# Patient Record
Sex: Male | Born: 1940 | Hispanic: Yes | Marital: Married | Smoking: Never smoker
Health system: Southern US, Community
[De-identification: ages and names within clinical notes are randomized; demographics above are authoritative.]

## PROBLEM LIST (undated history)

## (undated) DIAGNOSIS — M1990 Unspecified osteoarthritis, unspecified site: Secondary | ICD-10-CM

## (undated) DIAGNOSIS — B001 Herpesviral vesicular dermatitis: Secondary | ICD-10-CM

## (undated) DIAGNOSIS — R7303 Prediabetes: Secondary | ICD-10-CM

## (undated) DIAGNOSIS — Z85828 Personal history of other malignant neoplasm of skin: Secondary | ICD-10-CM

## (undated) DIAGNOSIS — I10 Essential (primary) hypertension: Secondary | ICD-10-CM

## (undated) DIAGNOSIS — R519 Headache, unspecified: Secondary | ICD-10-CM

## (undated) DIAGNOSIS — R51 Headache: Secondary | ICD-10-CM

## (undated) DIAGNOSIS — E785 Hyperlipidemia, unspecified: Secondary | ICD-10-CM

## (undated) HISTORY — DX: Hyperlipidemia, unspecified: E78.5

## (undated) HISTORY — DX: Herpesviral vesicular dermatitis: B00.1

## (undated) HISTORY — DX: Personal history of other malignant neoplasm of skin: Z85.828

## (undated) HISTORY — DX: Prediabetes: R73.03

## (undated) HISTORY — DX: Essential (primary) hypertension: I10

## (undated) HISTORY — DX: Headache: R51

## (undated) HISTORY — PX: MENISCUS REPAIR: SHX5179

## (undated) HISTORY — PX: TRANSURETHRAL RESECTION OF PROSTATE: SHX73

## (undated) HISTORY — DX: Headache, unspecified: R51.9

---

## 2011-04-26 LAB — HM COLONOSCOPY

## 2014-10-06 HISTORY — PX: INGUINAL HERNIA REPAIR: SUR1180

## 2016-03-19 DIAGNOSIS — M545 Low back pain: Secondary | ICD-10-CM | POA: Diagnosis not present

## 2016-03-21 DIAGNOSIS — D1801 Hemangioma of skin and subcutaneous tissue: Secondary | ICD-10-CM | POA: Diagnosis not present

## 2016-03-21 DIAGNOSIS — Z85820 Personal history of malignant melanoma of skin: Secondary | ICD-10-CM | POA: Diagnosis not present

## 2016-03-21 DIAGNOSIS — D225 Melanocytic nevi of trunk: Secondary | ICD-10-CM | POA: Diagnosis not present

## 2016-03-21 DIAGNOSIS — L821 Other seborrheic keratosis: Secondary | ICD-10-CM | POA: Diagnosis not present

## 2016-03-21 DIAGNOSIS — L910 Hypertrophic scar: Secondary | ICD-10-CM | POA: Diagnosis not present

## 2016-03-27 DIAGNOSIS — M545 Low back pain: Secondary | ICD-10-CM | POA: Diagnosis not present

## 2016-04-02 DIAGNOSIS — M545 Low back pain: Secondary | ICD-10-CM | POA: Diagnosis not present

## 2016-04-04 DIAGNOSIS — M545 Low back pain: Secondary | ICD-10-CM | POA: Diagnosis not present

## 2016-04-09 DIAGNOSIS — M545 Low back pain: Secondary | ICD-10-CM | POA: Diagnosis not present

## 2016-04-17 DIAGNOSIS — M545 Low back pain: Secondary | ICD-10-CM | POA: Diagnosis not present

## 2016-04-20 DIAGNOSIS — H00012 Hordeolum externum right lower eyelid: Secondary | ICD-10-CM | POA: Diagnosis not present

## 2016-04-20 DIAGNOSIS — M4696 Unspecified inflammatory spondylopathy, lumbar region: Secondary | ICD-10-CM | POA: Diagnosis not present

## 2016-04-23 DIAGNOSIS — M47816 Spondylosis without myelopathy or radiculopathy, lumbar region: Secondary | ICD-10-CM | POA: Diagnosis not present

## 2016-04-23 DIAGNOSIS — H10411 Chronic giant papillary conjunctivitis, right eye: Secondary | ICD-10-CM | POA: Diagnosis not present

## 2016-04-24 ENCOUNTER — Telehealth: Payer: Self-pay

## 2016-04-24 NOTE — Telephone Encounter (Signed)
Pre VIsit call completed.  

## 2016-04-25 ENCOUNTER — Ambulatory Visit (INDEPENDENT_AMBULATORY_CARE_PROVIDER_SITE_OTHER): Payer: Medicare Other | Admitting: Family Medicine

## 2016-04-25 ENCOUNTER — Ambulatory Visit: Payer: Self-pay | Admitting: Family Medicine

## 2016-04-25 ENCOUNTER — Telehealth: Payer: Self-pay | Admitting: Family Medicine

## 2016-04-25 ENCOUNTER — Encounter: Payer: Self-pay | Admitting: Family Medicine

## 2016-04-25 VITALS — BP 132/68 | HR 72 | Temp 97.6°F | Ht 63.0 in | Wt 147.4 lb

## 2016-04-25 DIAGNOSIS — R109 Unspecified abdominal pain: Secondary | ICD-10-CM

## 2016-04-25 DIAGNOSIS — G4700 Insomnia, unspecified: Secondary | ICD-10-CM

## 2016-04-25 LAB — CBC WITH DIFFERENTIAL/PLATELET
Basophils Absolute: 0 10*3/uL (ref 0.0–0.1)
Basophils Relative: 0.5 % (ref 0.0–3.0)
EOS PCT: 2.1 % (ref 0.0–5.0)
Eosinophils Absolute: 0.1 10*3/uL (ref 0.0–0.7)
HCT: 45.7 % (ref 39.0–52.0)
Hemoglobin: 15.9 g/dL (ref 13.0–17.0)
LYMPHS ABS: 1.4 10*3/uL (ref 0.7–4.0)
Lymphocytes Relative: 26 % (ref 12.0–46.0)
MCHC: 34.8 g/dL (ref 30.0–36.0)
MCV: 91.2 fl (ref 78.0–100.0)
MONO ABS: 0.6 10*3/uL (ref 0.1–1.0)
Monocytes Relative: 11 % (ref 3.0–12.0)
NEUTROS ABS: 3.2 10*3/uL (ref 1.4–7.7)
NEUTROS PCT: 60.4 % (ref 43.0–77.0)
PLATELETS: 185 10*3/uL (ref 150.0–400.0)
RBC: 5.02 Mil/uL (ref 4.22–5.81)
RDW: 13.1 % (ref 11.5–15.5)
WBC: 5.2 10*3/uL (ref 4.0–10.5)

## 2016-04-25 LAB — COMPREHENSIVE METABOLIC PANEL
ALK PHOS: 77 U/L (ref 39–117)
ALT: 45 U/L (ref 0–53)
AST: 31 U/L (ref 0–37)
Albumin: 4.5 g/dL (ref 3.5–5.2)
BUN: 20 mg/dL (ref 6–23)
CO2: 30 mEq/L (ref 19–32)
Calcium: 9.6 mg/dL (ref 8.4–10.5)
Chloride: 103 mEq/L (ref 96–112)
Creatinine, Ser: 1.02 mg/dL (ref 0.40–1.50)
GFR: 75.53 mL/min (ref >60.00)
GLUCOSE: 119 mg/dL — AB (ref 70–99)
POTASSIUM: 4.1 meq/L (ref 3.5–5.1)
SODIUM: 140 meq/L (ref 135–145)
TOTAL PROTEIN: 7.1 g/dL (ref 6.0–8.3)
Total Bilirubin: 0.8 mg/dL (ref 0.2–1.2)

## 2016-04-25 MED ORDER — PANTOPRAZOLE SODIUM 40 MG PO TBEC: 40.0000 mg | DELAYED_RELEASE_TABLET | Freq: Every day | ORAL | 0 refills | Status: DC

## 2016-04-25 MED ORDER — TRAZODONE HCL 50 MG PO TABS: 25.0000 mg | ORAL_TABLET | Freq: Every evening | ORAL | 1 refills | Status: DC | PRN

## 2016-04-25 NOTE — Telephone Encounter (Signed)
I have resent Rx to the correct pharmacy. Pt is aware. TL/CMA

## 2016-04-25 NOTE — Telephone Encounter (Signed)
Caller name: Chueyee  Relation to pt: self  Call back number: [phone] [facility]acy: [facility] [place],  [place]- Tel [phone]  Reason for call: Pt was seen today as a new pt (04-25-16) and saw on his documents that his meds were sent to [facility], pt states needs his meds sent to [facility] in [place]. Please advise ASAP.

## 2016-04-25 NOTE — Patient Instructions (Addendum)
Keep a symptom diary and see what makes your discomfort better and worse. Pay particular attention to foods, bowel movements and liquids.   Contact [phone] to schedule an appointment or inquire about cost/insurance coverage to help with sleep. Ask about cognitive behavioral therapy (CBT).

## 2016-04-25 NOTE — Progress Notes (Signed)
Chief Complaint  Patient presents with  . Establish Care    Pt reports severe headaches and sensitvity with noise and dizziness       New Patient Visit SUBJECTIVE: HPI: Ronnie Payne is an 75 y.o.male who is being seen for establishing care.  The patient was previously seen at an office in [place].   Abd discomfort Over the past mo, the pt has been experiencing nausea, abdominal pain, poor appetite. He has lost 3-4 lbs over the past mo.  The pain is centrally located. No fevers, diarrhea, constipation, nighttime awakenings, or dark tarry stools/bleeding. He has tried Omeprazole and ranitidine that was not helpful-it had been in the past with similar discomfort. He does not notice anything that makes it better or worse.  He also has a hx of insomnia for which he had been taking Restoril and Xanax.   No Known Allergies  Past Medical History:  Diagnosis Date  . Frequent headaches   . History of skin cancer   . Hyperlipidemia   . Hypertension    Past Surgical History:  Procedure Laterality Date  . INGUINAL HERNIA REPAIR  10/06/2014  . TRANSURETHRAL RESECTION OF PROSTATE  2000-2012   Social History   Social History  . Marital status: Married   Social History Main Topics  . Smoking status: Never Smoker  . Smokeless tobacco: Never Used  . Alcohol use No  . Drug use: No   History reviewed. No pertinent family history.   Current Outpatient Prescriptions:  .  atenolol (TENORMIN) 50 MG tablet, Take 1 tablet by mouth daily., Disp: , Rfl:  .  atorvastatin (LIPITOR) 40 MG tablet, Take 1 tablet by mouth daily., Disp: , Rfl:  .  Cholecalciferol (VITAMIN D) 2000 units tablet, Take 2,000 Units by mouth daily., Disp: , Rfl:  .  doxycycline (VIBRAMYCIN) 100 MG capsule, Take 1 capsule by mouth 2 (two) times daily., Disp: , Rfl:  .  multivitamin-iron-minerals-folic acid (CENTRUM) chewable tablet, Chew 1 tablet by mouth daily., Disp: , Rfl:  .  omeprazole (PRILOSEC) 40 MG capsule, Take 1  capsule by mouth as needed., Disp: , Rfl:  .  pantoprazole (PROTONIX) 40 MG tablet, Take 1 tablet (40 mg total) by mouth daily., Disp: 30 tablet, Rfl: 0 .  traZODone (DESYREL) 50 MG tablet, Take 0.5-1 tablets (25-50 mg total) by mouth at bedtime as needed for sleep., Disp: 30 tablet, Rfl: 1  ROS GU: Denies urinary complaints  GI: As noted in HPI   OBJECTIVE: BP 132/68 (BP Location: Left Arm, Patient Position: Sitting, Cuff Size: Small)   Pulse 72   Temp 97.6 F (36.4 C) (Oral)   Ht 5\' 3"  (1.6 m)   Wt 147 lb 6.4 oz (66.9 kg)   SpO2 98%   BMI 26.11 kg/m   Constitutional: -  VS reviewed -  Well developed, well nourished, appears stated age -  No apparent distress  Psychiatric: -  Oriented to person, place, and time -  Memory intact -  Affect and mood normal -  Fluent conversation, good eye contact -  Judgment and insight age appropriate  ENMT: -  Oral mucosa without lesions, tongue and uvula midline    Tonsils not enlarged, no erythema, no exudate, trachea midline    Pharynx moist, no lesions, no erythema  Neck: -  No gross swelling, no palpable masses -  Thyroid midline, not enlarged, mobile, no palpable masses  Cardiovascular: -  RRR, no murmurs -  No LE edema  Respiratory: -  Normal respiratory effort, no accessory muscle use, no retraction -  Breath sounds equal, no wheezes, no ronchi, no crackles  Gastrointestinal: -  Bowel sounds normal -  Mild TTP in umbilical region, no distention, no guarding, no masses -  Neg Murphy's, McBurney's, Rovsing's, Carnetts  Neurological:  -  CN II - XII grossly intact -  Sensation grossly intact to light touch, equal bilaterally  Musculoskeletal: -  No clubbing, no cyanosis -  Gait normal  Skin: -  No significant lesion on inspection -  Warm and dry to palpation   ASSESSMENT/PLAN: Abdominal discomfort - Plan: Comprehensive metabolic panel, CBC w/Diff, pantoprazole (PROTONIX) 40 MG tablet, DISCONTINUED: pantoprazole (PROTONIX) 40 MG  tablet  Insomnia, unspecified type - Plan: traZODone (DESYREL) 50 MG tablet, DISCONTINUED: traZODone (DESYREL) 50 MG tablet  Patient instructed to sign release of records form from his previous PCP. Change omeprazole to pantoprazole. Keep symptom journal. Basic labs. I instructed him that I do not rx hypnotics or benzo's for sleep. Number given for CBT. Changed to Trazodone.  Patient should return in 2 weeks. If he is still having issues, will refer to GI for hopeful EGD. The patient voiced understanding and agreement to the plan.   [place], DO 04/25/16  2:28 PM

## 2016-04-25 NOTE — Progress Notes (Signed)
Pre visit review using our clinic review tool, if applicable. No additional management support is needed unless otherwise documented below in the visit note. 

## 2016-04-27 DIAGNOSIS — H10411 Chronic giant papillary conjunctivitis, right eye: Secondary | ICD-10-CM | POA: Diagnosis not present

## 2016-04-27 DIAGNOSIS — H2513 Age-related nuclear cataract, bilateral: Secondary | ICD-10-CM | POA: Diagnosis not present

## 2016-04-27 DIAGNOSIS — H00012 Hordeolum externum right lower eyelid: Secondary | ICD-10-CM | POA: Diagnosis not present

## 2016-05-08 DIAGNOSIS — M47816 Spondylosis without myelopathy or radiculopathy, lumbar region: Secondary | ICD-10-CM | POA: Diagnosis not present

## 2016-05-09 ENCOUNTER — Ambulatory Visit (INDEPENDENT_AMBULATORY_CARE_PROVIDER_SITE_OTHER): Payer: Medicare Other | Admitting: Family Medicine

## 2016-05-09 ENCOUNTER — Encounter: Payer: Self-pay | Admitting: Gastroenterology

## 2016-05-09 ENCOUNTER — Encounter: Payer: Self-pay | Admitting: Family Medicine

## 2016-05-09 VITALS — BP 123/59 | HR 74 | Temp 97.5°F | Ht 63.0 in | Wt 148.0 lb

## 2016-05-09 DIAGNOSIS — R1013 Epigastric pain: Secondary | ICD-10-CM | POA: Diagnosis not present

## 2016-05-09 NOTE — Progress Notes (Signed)
Chief Complaint  Patient presents with  . Follow-up    abdominal pain /Pt reports improving very little but not much     Ronnie Payne is here for burning abdominal pain. It is constant for him.  Duration: 7 weeks Nighttime awakenings? No Bleeding? No Weight loss? Yes Palliation: Ranitidine Provocation: eating, position does not affect it Associated symptoms: Nausea Denies: fevers, diarrhea, blood in stool, dark tarry stools Treatment to date: omeprazole, pantoprazole Is maintained and his bowel movements are normal for him and his pain is not affected by defecation.  ROS: Constitutional: No fevers GI: No V/D/C, no bleeding + pain  Past Medical History:  Diagnosis Date  . Frequent headaches   . History of skin cancer   . Hyperlipidemia   . Hypertension    No family history on file. Past Surgical History:  Procedure Laterality Date  . INGUINAL HERNIA REPAIR  10/06/2014  . TRANSURETHRAL RESECTION OF PROSTATE  2000-2012    BP (!) 123/59 (BP Location: Left Arm, Patient Position: Sitting, Cuff Size: Small)   Pulse 74   Temp 97.5 F (36.4 C) (Oral)   Ht 5\' 3"  (1.6 m)   Wt 148 lb (67.1 kg)   SpO2 99%   BMI 26.22 kg/m  Gen.: Awake, alert, appears stated age 76: Mucous membranes moist without mucosal lesions Heart: Regular rate and rhythm without murmurs Lungs: Clear auscultation bilaterally, no rales or wheezing, normal effort without accessory muscle use. Abdomen: Bowel sounds are present. Abdomen is soft, TTP in epigastric region, nondistended, no masses or organomegaly. Negative Murphy's, Rovsing's, McBurney's, and Carnett's sign. Psych: Age appropriate judgment and insight. Normal mood and affect.  Abdominal pain, epigastric - Plan: Ambulatory referral to Gastroenterology  His labs were unremarkable. Given failure of 2 PPI's, will refer to GI for second opinion and hopeful EGD to also r/o ulcers given his slight weight loss and loss of appetite. F/u prn. Pt  voiced understanding and agreement to the plan.  [place], DO 05/09/16 4:09 PM

## 2016-05-09 NOTE — Patient Instructions (Signed)
Claritin (loratadine), Allegra (fexofenadine), Zyrtec (cetirizine); these are listed in order from weakest to strongest. Generic, and therefore cheaper, options are in the parentheses.   There are available OTC, and the generic versions, which may be cheaper, are in parentheses. Show this to a pharmacist if you have trouble finding any of these items.  Someone will contact you regarding your GI appointment. If you do not hear anything in 1-2 weeks, feel free to contact our office for an update.

## 2016-05-09 NOTE — Progress Notes (Signed)
Pre visit review using our clinic review tool, if applicable. No additional management support is needed unless otherwise documented below in the visit note. 

## 2016-05-11 ENCOUNTER — Encounter: Payer: Self-pay | Admitting: Physician Assistant

## 2016-05-11 ENCOUNTER — Ambulatory Visit (INDEPENDENT_AMBULATORY_CARE_PROVIDER_SITE_OTHER): Payer: Medicare Other | Admitting: Physician Assistant

## 2016-05-11 VITALS — BP 122/70 | HR 72 | Ht 63.0 in | Wt 148.2 lb

## 2016-05-11 DIAGNOSIS — R1033 Periumbilical pain: Secondary | ICD-10-CM | POA: Diagnosis not present

## 2016-05-11 DIAGNOSIS — R6881 Early satiety: Secondary | ICD-10-CM | POA: Diagnosis not present

## 2016-05-11 DIAGNOSIS — R1084 Generalized abdominal pain: Secondary | ICD-10-CM

## 2016-05-11 DIAGNOSIS — R630 Anorexia: Secondary | ICD-10-CM | POA: Diagnosis not present

## 2016-05-11 DIAGNOSIS — R109 Unspecified abdominal pain: Secondary | ICD-10-CM

## 2016-05-11 DIAGNOSIS — R110 Nausea: Secondary | ICD-10-CM

## 2016-05-11 MED ORDER — PANTOPRAZOLE SODIUM 40 MG PO TBEC: 40.0000 mg | DELAYED_RELEASE_TABLET | Freq: Every day | ORAL | 3 refills | Status: DC

## 2016-05-11 NOTE — Progress Notes (Signed)
Subjective:    Patient ID: Ronnie Payne, male    DOB: 1940-11-07, 76 y.o.   MRN: [id number]  HPI Ronnie Payne is a pleasant 76 year old Hispanic male male, new to GI today referred by Riki Sheer D.O. for evaluation of abdominal pain. Patient states that he and his wife relocated to [place] about 3 months ago and had been in [place] prior to that. He states that he did have one prior colonoscopy done while living in [place] about 7 years ago and that was a normal exam. As no other previous GI history. He has been relatively healthy with history of BPH status post TURP and had a prior hernia repair. Patient states she's had his current abdominal pain for about 6 weeks. Asked whenever he is had any abdominal discomfort his wife would start him on Zantac which would usually help. He started using Zantac fairly regularly without any benefit, she been purchased omeprazole 20 mg by mouth daily and then increase this to 40 mg by mouth daily but still had no relief in his discomfort. He eventually went to his PCP Dr. Nani Ravens and was started on Protonix 40 mg by mouth daily. He has been on that over the past couple of weeks and has noted no change in his symptoms. He says that his discomfort is in his mid abdomen he describes it as a burning type discomfort. He has had some nighttime awakening with pain. His appetite has been decreased and his weight is down 2 or 3 pounds. He relates that he has been filling up very quickly and feels very full with less food. He has had nausea without vomiting. No heartburn or indigestion no dysphagia no melena or hematochezia. He is not on any regular aspirin or NSAIDs, no regular EtOH. Family history negative for GI disease as far as he is aware. CBC and CMET done 04/25/2016 both unremarkable.   Review of Systems Pertinent positive and negative review of systems were noted in the above HPI section.  All other review of systems was otherwise negative.  Outpatient  Encounter Prescriptions as of 05/11/2016  Medication Sig  . atenolol (TENORMIN) 50 MG tablet Take 1 tablet by mouth daily.  Marland Kitchen atorvastatin (LIPITOR) 40 MG tablet Take 1 tablet by mouth daily.  . Cholecalciferol (VITAMIN D) 2000 units tablet Take 2,000 Units by mouth daily.  . multivitamin-iron-minerals-folic acid (CENTRUM) chewable tablet Chew 1 tablet by mouth daily.  Marland Kitchen neomycin-polymyxin b-dexamethasone (MAXITROL) 3.5-10000-0.1 OINT Place 1 application into both eyes 2 (two) times daily.  . pantoprazole (PROTONIX) 40 MG tablet Take 1 tablet (40 mg total) by mouth daily.  . [DISCONTINUED] pantoprazole (PROTONIX) 40 MG tablet Take 1 tablet (40 mg total) by mouth daily.  . [DISCONTINUED] doxycycline (VIBRAMYCIN) 100 MG capsule Take 1 capsule by mouth 2 (two) times daily.  . [DISCONTINUED] omeprazole (PRILOSEC) 40 MG capsule Take 1 capsule by mouth as needed.  . [DISCONTINUED] traZODone (DESYREL) 50 MG tablet Take 0.5-1 tablets (25-50 mg total) by mouth at bedtime as needed for sleep.   No facility-administered encounter medications on file as of 05/11/2016.    No Known Allergies There are no active problems to display for this patient.  Social History   Social History  . Marital status: Married    Spouse name: N/A  . Number of children: 1  . Years of education: N/A   Occupational History  . retired     Optometrist   Social History Main Topics  . Smoking status: Never  Smoker  . Smokeless tobacco: Never Used  . Alcohol use No  . Drug use: No  . Sexual activity: Not on file   Other Topics Concern  . Not on file   Social History Narrative  . No narrative on file    [name]'s family history includes Emphysema in his father; Lung cancer in his sister; Thyroid cancer in his mother.      Objective:    Vitals:   05/11/16 1319  BP: 122/70  Pulse: 72    Physical Exam  well-developed older Hispanic male in no acute distress, pleasant blood pressure 122/70 pulse 72 height 5  foot 3 weight 148 BMI of 26.2. HEENT; nontraumatic normocephalic EOMI PERRLA sclera anicteric, Cardiovascular; regular rate and rhythm with S1-S2 no murmur or gallop, Pulmonary; clear bilaterally, Abdomen ;soft, bowel sounds are present there is no palpable mass or hepatosplenomegaly he does have some mild tenderness across the upper abdomen nonfocal, Rectal; exam not done, Extremities ;no clubbing cyanosis or edema skin warm and dry, Neuropsych ;mood and affect appropriate       Assessment & Plan:   #38 76 year old Hispanic male with 6 week history of mid and upper abdominal discomfort associated with nausea, early satiety decrease in appetite and mild weight loss. Patient has had no improvement with PPI therapy. Etiology of pain is not clear, rule out intra-abdominal malignancy, rule out possible peptic ulcer disease.  #2 BPH  Plan; Patient will continue Protonix 40 mg by mouth every morning for now Schedule  for CT of the abdomen and pelvis with contrast next week. If CT is unremarkable he will need EGD with Dr. Henrene Pastor. I discussed potential EGD with patient and his wife today and they are agreeable.    Amy S Esterwood PA-C 05/11/2016   Cc: Shelda Pal*

## 2016-05-11 NOTE — Patient Instructions (Addendum)
We sent refills for the Pantoprazole Sodium 40 mg to [facility].    You have been scheduled for a CT scan of the abdomen and pelvis at [facility] ([address]---this is in the same building as [facility]).   You are scheduled on Tuesday 05-15-2016 at 3:30 PM. You should arrive at 3:15 pm to your appointment time for registration. Please follow the written instructions below on the day of your exam:  WARNING: IF YOU ARE ALLERGIC TO IODINE/X-RAY DYE, PLEASE NOTIFY RADIOLOGY IMMEDIATELY AT [phone]! YOU WILL BE GIVEN A 13 HOUR PREMEDICATION PREP.  1) Do not eat or drink anything after 11:30 am (4 hours prior to your test) 2) You have been given 2 bottles of oral contrast to drink. The solution may taste               better if refrigerated, but do NOT add ice or any other liquid to this solution. Shake             well before drinking.    Drink 1 bottle of contrast @ 1:30 PM (2 hours prior to your exam)  Drink 1 bottle of contrast @ 2:30 PM (1 hour prior to your exam)  You may take any medications as prescribed with a small amount of water except for the following: Metformin, Glucophage, Glucovance, Avandamet, Riomet, Fortamet, Actoplus Met, Janumet, Glumetza or Metaglip. The above medications must be held the day of the exam AND 48 hours after the exam.  The purpose of you drinking the oral contrast is to aid in the visualization of your intestinal tract. The contrast solution may cause some diarrhea. Before your exam is started, you will be given a small amount of fluid to drink. Depending on your individual set of symptoms, you may also receive an intravenous injection of x-ray contrast/dye. Plan on being at [facility] for 30 minutes or long, depending on the type of exam you are having performed.  If you have any questions regarding your exam or if you need to reschedule, you may call the CT department at [phone] between the  hours of 8:00 am and 5:00 pm, Monday-Friday.  ________________________________________________________________________

## 2016-05-12 ENCOUNTER — Encounter: Payer: Self-pay | Admitting: Physician Assistant

## 2016-05-13 NOTE — Progress Notes (Signed)
Agree with initial assessment and plans 

## 2016-05-15 ENCOUNTER — Ambulatory Visit (INDEPENDENT_AMBULATORY_CARE_PROVIDER_SITE_OTHER)
Admission: RE | Admit: 2016-05-15 | Discharge: 2016-05-15 | Disposition: A | Discharge status: Home / Self Care | Payer: Medicare Other | Source: Ambulatory Visit | Attending: Physician Assistant | Admitting: Physician Assistant

## 2016-05-15 DIAGNOSIS — R1084 Generalized abdominal pain: Secondary | ICD-10-CM

## 2016-05-15 DIAGNOSIS — R1033 Periumbilical pain: Secondary | ICD-10-CM

## 2016-05-15 DIAGNOSIS — R6881 Early satiety: Secondary | ICD-10-CM

## 2016-05-15 DIAGNOSIS — K7689 Other specified diseases of liver: Secondary | ICD-10-CM | POA: Diagnosis not present

## 2016-05-15 DIAGNOSIS — R110 Nausea: Secondary | ICD-10-CM

## 2016-05-15 DIAGNOSIS — R630 Anorexia: Secondary | ICD-10-CM

## 2016-05-15 IMAGING — CT CT ABD-PELV W/ CM
2 of 5 series · 15 of 46 positions shown, 17 images · IV contrast (ISOVUE 300)
Comparison: None.

CLINICAL DATA: Upper mid abdominal pain, early satiety for about 6
weeks

EXAM:
CT ABDOMEN AND PELVIS WITH CONTRAST
TECHNIQUE: Multidetector CT imaging of the abdomen and pelvis was performed
using the standard protocol following bolus administration of
intravenous contrast.
CONTRAST:  100mL [0T] IOPAMIDOL ([0T]) INJECTION 61%

[Series 2: abd/pel w · axial · 0.69mm/px · z∈[-465,-125]mm · 12 of 77 slices shown, 14 images]
[im 5/77  soft-tissue]
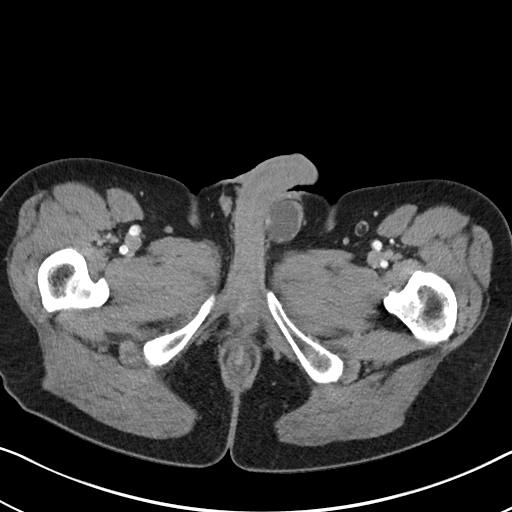
[im 5/77  bone]
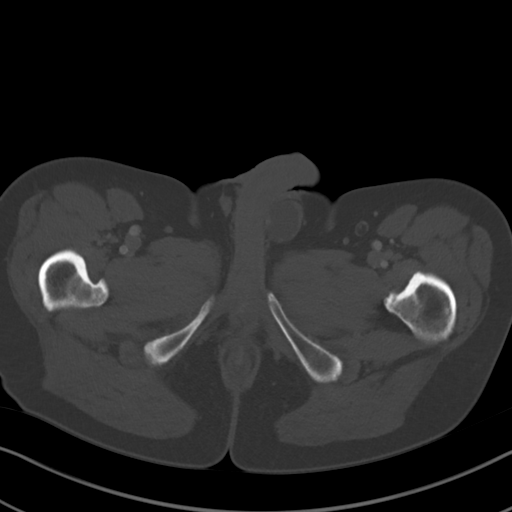
[im 13/77  soft-tissue]
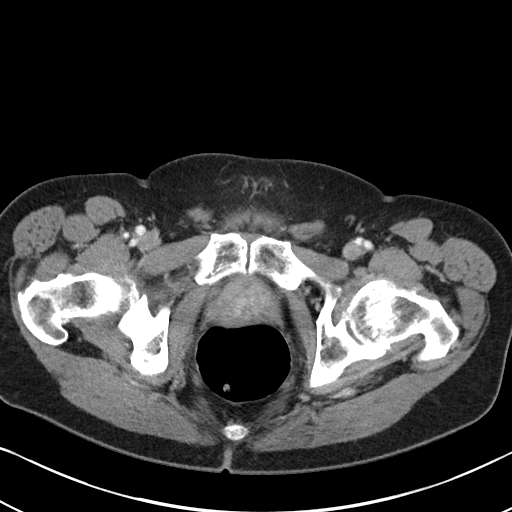
[im 17/77  soft-tissue]
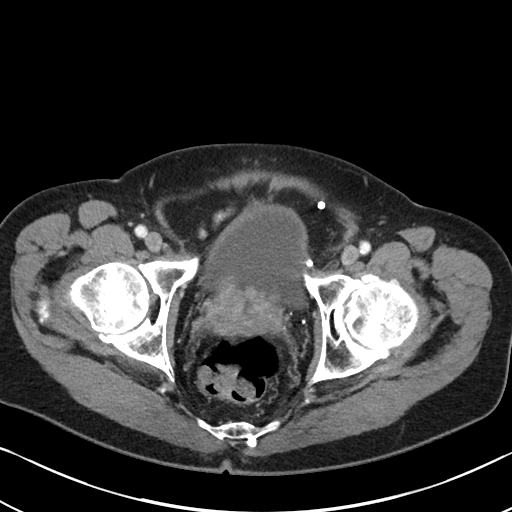
[im 25/77  soft-tissue]
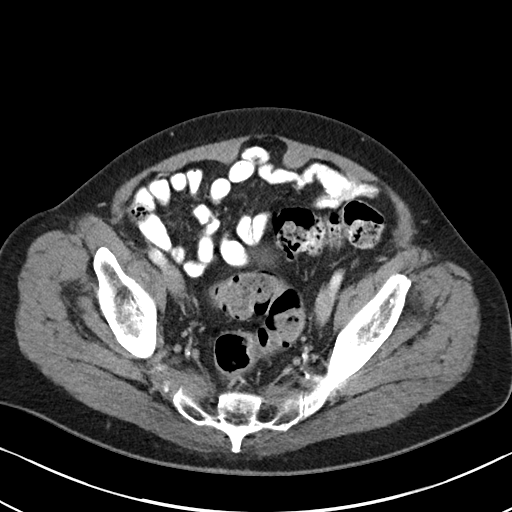
[im 29/77  soft-tissue]
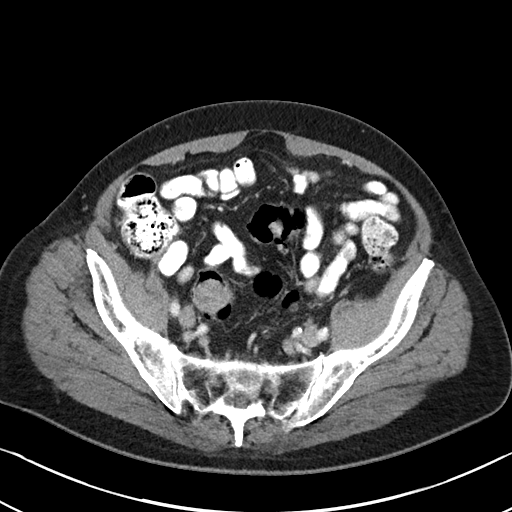
[im 37/77  soft-tissue]
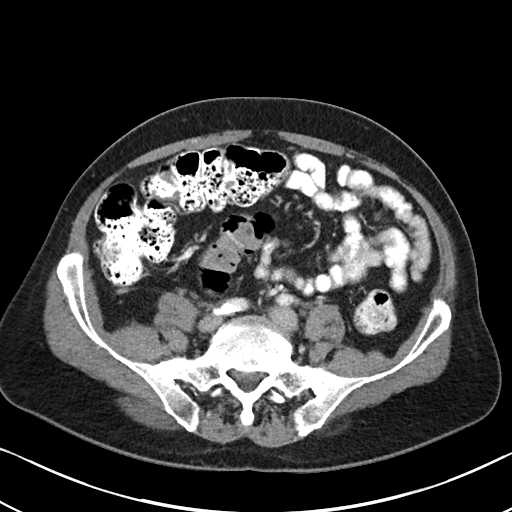
[im 41/77  soft-tissue]
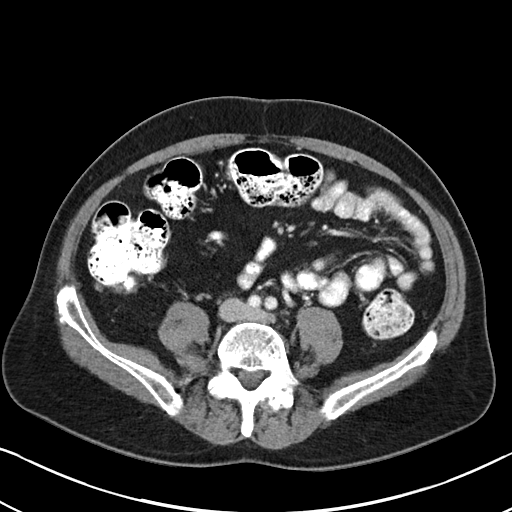
[im 49/77  soft-tissue]
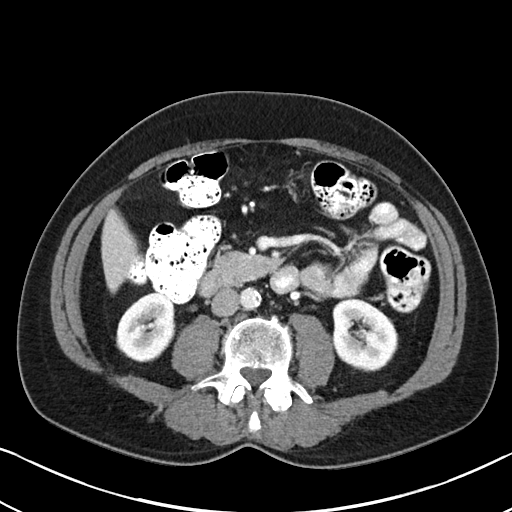
[im 53/77  soft-tissue]
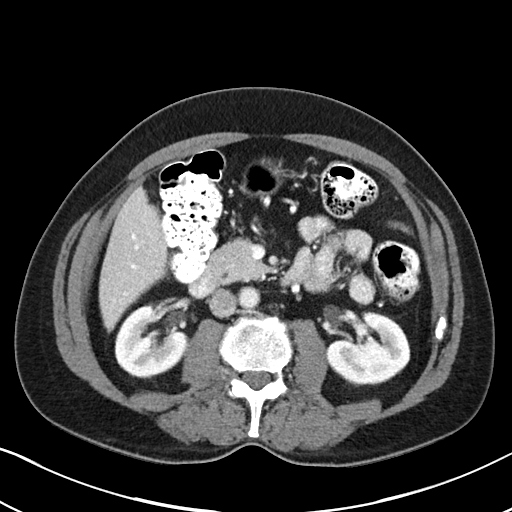
[im 53/77  bone]
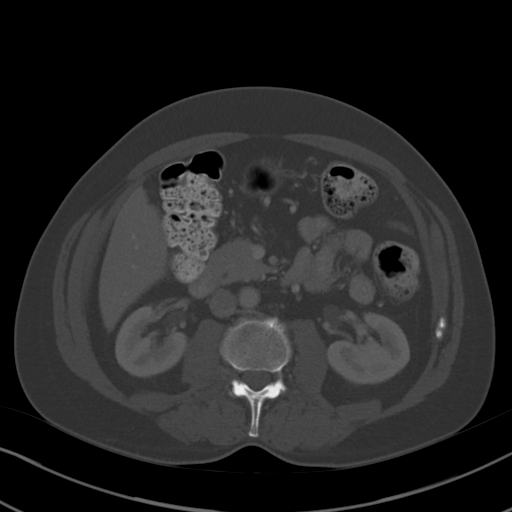
[im 61/77  soft-tissue]
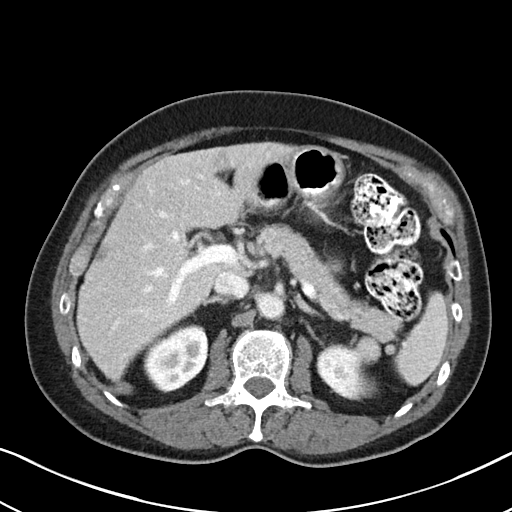
[im 65/77  soft-tissue]
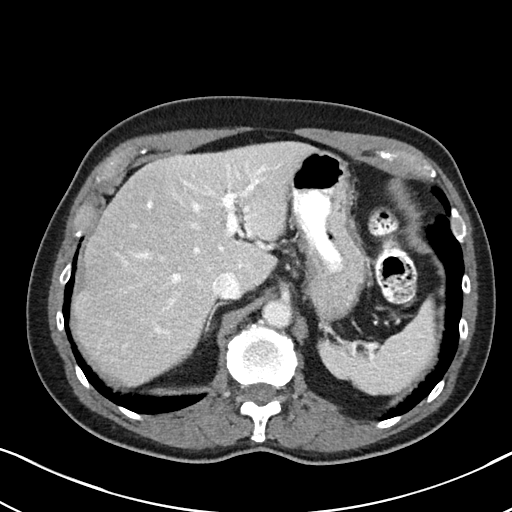
[im 73/77  soft-tissue]
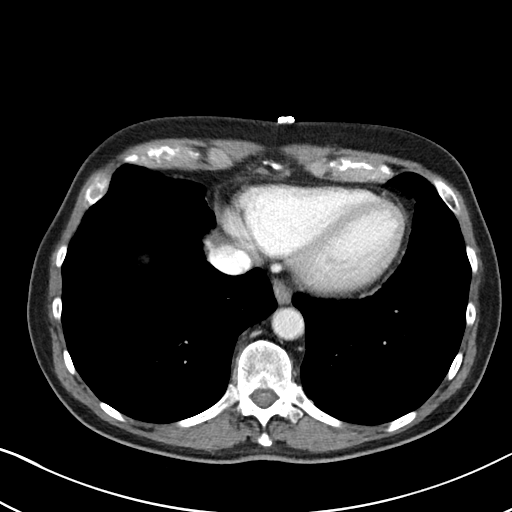

[Series 6: abd/pel w st · coronal · 0.62mm/px · 3 of 82 slices shown]
[im 28/82  soft-tissue]
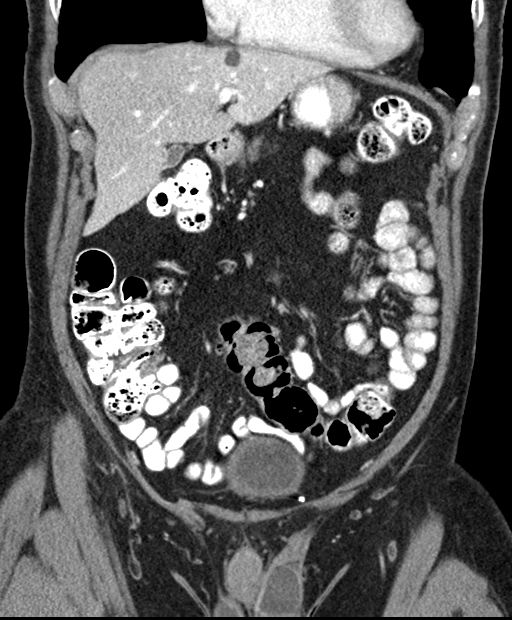
[im 37/82  soft-tissue]
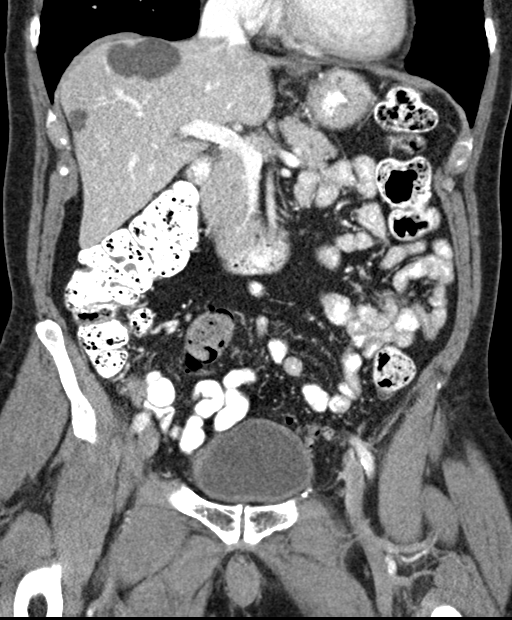
[im 46/82  soft-tissue]
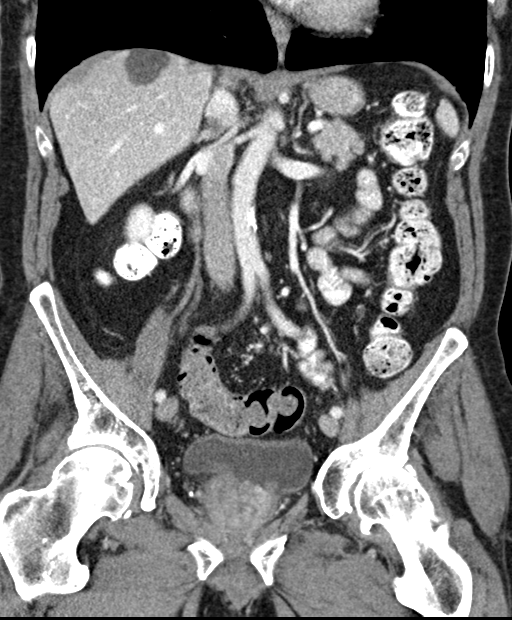

[15 of 46 positions shown; findings below may reference images not displayed]

FINDINGS: Lower chest: The lung bases shows no acute findings.

Hepatobiliary: Scattered hepatic cysts are noted the largest in
right hepatic dome centrally measures 4.7 cm. No calcified
gallstones are noted within contracted gallbladder.

Pancreas: Enhanced pancreas shows no focal abnormality.

Spleen: Normal in size without focal abnormality.

Adrenals/Urinary Tract: No adrenal gland mass. No hydronephrosis or
hydroureter. Delayed renal images shows bilateral renal symmetrical
excretion. Bilateral visualized proximal ureter is unremarkable. No
urinary bladder calcifications are noted. Nonspecific mild
thickening of anterior wall of the urinary bladder.

Stomach/Bowel: Oral contrast material was given to the patient. No
small bowel obstruction. No thickened or dilated small bowel loops.
Moderate stool noted in right colon and transverse colon. There is
no pericecal inflammation. Normal retrocecal appendix axial image
45. Moderate stool noted within sigmoid colon. No evidence of
colitis or diverticulitis. Moderate gas noted within rectum. No
distal colonic obstruction.

Vascular/Lymphatic: No aortic aneurysm. Mild atherosclerotic
calcifications of distal abdominal aorta and iliac arteries. No
adenopathy.

Reproductive: There is mild enlarged prostate gland with
heterogeneous appearance measures 5.5 x 4.3 cm. There is indentation
of urinary bladder base by prostate gland.

Other: No ascites or free abdominal air. Small bilateral hydroceles.

Musculoskeletal: No destructive bony lesions are noted. Sagittal
images of the spine shows diffuse osteopenia. Mild degenerative
changes thoracolumbar spine. Significant disc space flattening with
mild posterior spurring and vacuum disc phenomenon at L5-S1 level
IMPRESSION: 1. Scattered hepatic cysts are noted the largest in right hepatic
dome centrally measures 4.7 cm.
2. No hydronephrosis or hydroureter.
3. Moderate stool throughout the colon. No pericecal inflammation.
Normal appendix. No distal colonic obstruction. No colitis or
diverticulitis.
4. There is heterogeneous mild enlarged prostate gland with
indentation of urinary bladder base. Correlation with urology exam
and PSA level is recommended.
5. Mild degenerative changes thoracolumbar spine.
6. Nonspecific mild thickening of the anterior wall of urinary
bladder. No definite bladder mass is identified.
7. No small bowel obstruction.  No ascites or free abdominal air.

## 2016-05-15 MED ORDER — IOPAMIDOL (ISOVUE-300) INJECTION 61%
100.0000 mL | Freq: Once | INTRAVENOUS | Status: AC | PRN
  Administered 2016-05-15: 100 mL via INTRAVENOUS

## 2016-05-17 ENCOUNTER — Telehealth: Payer: Self-pay | Admitting: Physician Assistant

## 2016-05-17 NOTE — Telephone Encounter (Signed)
Pt calling for Ct results. Please advise.

## 2016-05-18 NOTE — Telephone Encounter (Signed)
Left a message to call back. See the CT result note

## 2016-05-21 ENCOUNTER — Ambulatory Visit: Payer: Self-pay | Admitting: Family Medicine

## 2016-05-28 DIAGNOSIS — M47816 Spondylosis without myelopathy or radiculopathy, lumbar region: Secondary | ICD-10-CM | POA: Diagnosis not present

## 2016-05-30 ENCOUNTER — Ambulatory Visit (AMBULATORY_SURGERY_CENTER): Payer: Self-pay

## 2016-05-30 DIAGNOSIS — R1084 Generalized abdominal pain: Secondary | ICD-10-CM

## 2016-05-30 NOTE — Progress Notes (Signed)
No allergies to eggs or soy No diet meds No home oxygen No past problems with anesthesia  Declined emmi 

## 2016-06-07 ENCOUNTER — Ambulatory Visit (AMBULATORY_SURGERY_CENTER): Payer: Medicare Other | Admitting: Internal Medicine

## 2016-06-07 ENCOUNTER — Encounter: Payer: Self-pay | Admitting: Internal Medicine

## 2016-06-07 VITALS — BP 127/60 | HR 58 | Temp 95.7°F | Resp 11 | Ht 63.0 in | Wt 148.0 lb

## 2016-06-07 DIAGNOSIS — R109 Unspecified abdominal pain: Secondary | ICD-10-CM | POA: Diagnosis not present

## 2016-06-07 DIAGNOSIS — R1084 Generalized abdominal pain: Secondary | ICD-10-CM

## 2016-06-07 DIAGNOSIS — I10 Essential (primary) hypertension: Secondary | ICD-10-CM | POA: Diagnosis not present

## 2016-06-07 MED ORDER — SODIUM CHLORIDE 0.9 % IV SOLN: 500.0000 mL | INTRAVENOUS | Status: DC

## 2016-06-07 NOTE — Op Note (Signed)
[facility] Patient Name: Ronnie Payne Procedure Date: 06/07/2016 3:24 PM MRN: [id number] Endoscopist: Docia Chuck. Henrene Pastor , MD Age: 76 Referring MD:  Date of Birth: [birth year] Gender: Male Account #: [id number] Procedure:                Upper GI endoscopy Indications:              Dyspepsia Medicines:                Monitored Anesthesia Care Procedure:                Pre-Anesthesia Assessment:                           - Prior to the procedure, a History and Physical                            was performed, and patient medications and                            allergies were reviewed. The patient's tolerance of                            previous anesthesia was also reviewed. The risks                            and benefits of the procedure and the sedation                            options and risks were discussed with the patient.                            All questions were answered, and informed consent                            was obtained. Prior Anticoagulants: The patient has                            taken no previous anticoagulant or antiplatelet                            agents. ASA Grade Assessment: II - A patient with                            mild systemic disease. After reviewing the risks                            and benefits, the patient was deemed in                            satisfactory condition to undergo the procedure.                           After obtaining informed consent, the endoscope was  passed under direct vision. Throughout the                            procedure, the patient's blood pressure, pulse, and                            oxygen saturations were monitored continuously. The                            Model GIF-[id number]) scope was introduced                            through the mouth, and advanced to the second part                            of duodenum. The upper GI endoscopy was                        accomplished without difficulty. The patient                            tolerated the procedure well. Scope In: Scope Out: Findings:                 The esophagus was normal.                           The stomach was normal.                           The examined duodenum was normal.                           The cardia and gastric fundus were normal on                            retroflexion. Complications:            No immediate complications. Estimated Blood Loss:     Estimated blood loss: none. Impression:               - Normal esophagus.                           - Normal stomach.                           - Normal examined duodenum.                           - No specimens collected. Recommendation:           - Patient has a contact number available for                            emergencies. The signs and symptoms of potential                            delayed complications were discussed  with the                            patient. Return to normal activities tomorrow.                            Written discharge instructions were provided to the                            patient.                           - Resume previous diet.                           - Continue present medications, including probiotic. Docia Chuck. Henrene Pastor, MD 06/07/2016 3:52:41 PM This report has been signed electronically.

## 2016-06-07 NOTE — Patient Instructions (Signed)
YOU HAD AN ENDOSCOPIC PROCEDURE TODAY AT THE [facility]:   Refer to the procedure report that was given to you for any specific questions about what was found during the examination.  If the procedure report does not answer your questions, please call your gastroenterologist to clarify.  If you requested that your care partner not be given the details of your procedure findings, then the procedure report has been included in a sealed envelope for you to review at your convenience later.  YOU SHOULD EXPECT: Some feelings of bloating in the abdomen. Passage of more gas than usual.  Walking can help get rid of the air that was put into your GI tract during the procedure and reduce the bloating. If you had a lower endoscopy (such as a colonoscopy or flexible sigmoidoscopy) you may notice spotting of blood in your stool or on the toilet paper. If you underwent a bowel prep for your procedure, you may not have a normal bowel movement for a few days.  Please Note:  You might notice some irritation and congestion in your nose or some drainage.  This is from the oxygen used during your procedure.  There is no need for concern and it should clear up in a day or so.  SYMPTOMS TO REPORT IMMEDIATELY:     Following upper endoscopy (EGD)  Vomiting of blood or coffee ground material  New chest pain or pain under the shoulder blades  Painful or persistently difficult swallowing  New shortness of breath  Fever of 100F or higher  Black, tarry-looking stools  For urgent or emergent issues, a gastroenterologist can be reached at any hour by calling [phone].   DIET:  We do recommend a small meal at first, but then you may proceed to your regular diet.  Drink plenty of fluids but you should avoid alcoholic beverages for 24 hours.  ACTIVITY:  You should plan to take it easy for the rest of today and you should NOT DRIVE or use heavy machinery until tomorrow (because of the sedation medicines  used during the test).    FOLLOW UP: Our staff will call the number listed on your records the next business day following your procedure to check on you and address any questions or concerns that you may have regarding the information given to you following your procedure. If we do not reach you, we will leave a message.  However, if you are feeling well and you are not experiencing any problems, there is no need to return our call.  We will assume that you have returned to your regular daily activities without incident.  If any biopsies were taken you will be contacted by phone or by letter within the next 1-3 weeks.  Please call us at [phone] if you have not heard about the biopsies in 3 weeks.    SIGNATURES/CONFIDENTIALITY: You and/or your care partner have signed paperwork which will be entered into your electronic medical record.  These signatures attest to the fact that that the information above on your After Visit Summary has been reviewed and is understood.  Full responsibility of the confidentiality of this discharge information lies with you and/or your care-partner.   Resume medications. 

## 2016-06-07 NOTE — Progress Notes (Signed)
Patient awakening,vss,report to rn 

## 2016-06-08 ENCOUNTER — Telehealth: Payer: Self-pay

## 2016-06-08 NOTE — Telephone Encounter (Signed)
  Follow up Call-  Call back number 06/07/2016  Post procedure Call Back phone  # [phone]  Permission to leave phone message Yes     Patient questions:  Do you have a fever, pain , or abdominal swelling? No. Pain Score  0 *  Have you tolerated food without any problems? Yes.    Have you been able to return to your normal activities? Yes.    Do you have any questions about your discharge instructions: Diet   No. Medications  No. Follow up visit  No.  Do you have questions or concerns about your Care? No.  Actions: * If pain score is 4 or above: No action needed, pain <4.

## 2016-06-11 ENCOUNTER — Ambulatory Visit: Payer: Medicare Other | Admitting: Gastroenterology

## 2016-06-22 DIAGNOSIS — D235 Other benign neoplasm of skin of trunk: Secondary | ICD-10-CM | POA: Diagnosis not present

## 2016-06-22 DIAGNOSIS — B079 Viral wart, unspecified: Secondary | ICD-10-CM | POA: Diagnosis not present

## 2016-06-22 DIAGNOSIS — Z85820 Personal history of malignant melanoma of skin: Secondary | ICD-10-CM | POA: Diagnosis not present

## 2016-06-22 DIAGNOSIS — D1801 Hemangioma of skin and subcutaneous tissue: Secondary | ICD-10-CM | POA: Diagnosis not present

## 2016-06-22 DIAGNOSIS — L910 Hypertrophic scar: Secondary | ICD-10-CM | POA: Diagnosis not present

## 2016-06-22 DIAGNOSIS — L814 Other melanin hyperpigmentation: Secondary | ICD-10-CM | POA: Diagnosis not present

## 2016-06-22 DIAGNOSIS — L821 Other seborrheic keratosis: Secondary | ICD-10-CM | POA: Diagnosis not present

## 2016-06-25 DIAGNOSIS — M47816 Spondylosis without myelopathy or radiculopathy, lumbar region: Secondary | ICD-10-CM | POA: Diagnosis not present

## 2016-06-26 DIAGNOSIS — J343 Hypertrophy of nasal turbinates: Secondary | ICD-10-CM | POA: Diagnosis not present

## 2016-06-26 DIAGNOSIS — J358 Other chronic diseases of tonsils and adenoids: Secondary | ICD-10-CM | POA: Diagnosis not present

## 2016-06-26 DIAGNOSIS — L299 Pruritus, unspecified: Secondary | ICD-10-CM | POA: Diagnosis not present

## 2016-06-26 DIAGNOSIS — J342 Deviated nasal septum: Secondary | ICD-10-CM | POA: Diagnosis not present

## 2016-07-04 ENCOUNTER — Encounter: Payer: Self-pay | Admitting: Family Medicine

## 2016-07-04 DIAGNOSIS — Z87898 Personal history of other specified conditions: Secondary | ICD-10-CM

## 2016-07-04 DIAGNOSIS — Z125 Encounter for screening for malignant neoplasm of prostate: Secondary | ICD-10-CM

## 2016-07-04 DIAGNOSIS — R7309 Other abnormal glucose: Secondary | ICD-10-CM

## 2016-07-04 DIAGNOSIS — E785 Hyperlipidemia, unspecified: Secondary | ICD-10-CM

## 2016-07-04 DIAGNOSIS — Z0000 Encounter for general adult medical examination without abnormal findings: Secondary | ICD-10-CM

## 2016-07-16 ENCOUNTER — Telehealth: Payer: Self-pay | Admitting: Family Medicine

## 2016-07-16 ENCOUNTER — Ambulatory Visit: Payer: Self-pay | Admitting: Family Medicine

## 2016-07-16 NOTE — Telephone Encounter (Signed)
Pt called in to reschedule his visit for today due to the weather.    Pt is rescheduled for next Monday.

## 2016-07-20 DIAGNOSIS — L820 Inflamed seborrheic keratosis: Secondary | ICD-10-CM | POA: Diagnosis not present

## 2016-07-20 DIAGNOSIS — L259 Unspecified contact dermatitis, unspecified cause: Secondary | ICD-10-CM | POA: Diagnosis not present

## 2016-07-20 DIAGNOSIS — B079 Viral wart, unspecified: Secondary | ICD-10-CM | POA: Diagnosis not present

## 2016-07-23 ENCOUNTER — Ambulatory Visit: Payer: Medicare Other | Admitting: Family Medicine

## 2016-07-23 ENCOUNTER — Other Ambulatory Visit (INDEPENDENT_AMBULATORY_CARE_PROVIDER_SITE_OTHER): Payer: Medicare Other

## 2016-07-23 DIAGNOSIS — E785 Hyperlipidemia, unspecified: Secondary | ICD-10-CM

## 2016-07-23 DIAGNOSIS — R7309 Other abnormal glucose: Secondary | ICD-10-CM | POA: Diagnosis not present

## 2016-07-23 LAB — LIPID PANEL
CHOLESTEROL: 182 mg/dL (ref 0–200)
HDL: 52.9 mg/dL (ref >39.00)
LDL CALC: 109 mg/dL — AB (ref 0–99)
NonHDL: 129.39
Total CHOL/HDL Ratio: 3
Triglycerides: 100 mg/dL (ref 0.0–149.0)
VLDL: 20 mg/dL (ref 0.0–40.0)

## 2016-07-23 LAB — HEMOGLOBIN A1C: HEMOGLOBIN A1C: 5.4 % (ref 4.6–6.5)

## 2016-07-23 NOTE — Progress Notes (Signed)
Pre visit review using our clinic review tool, if applicable. No additional management support is needed unless otherwise documented below in the visit note. 

## 2016-07-24 NOTE — Progress Notes (Signed)
Pt erroneously scheduled for appt with provider when he needed a lab appt.

## 2016-08-31 ENCOUNTER — Encounter: Payer: Self-pay | Admitting: Family Medicine

## 2016-08-31 ENCOUNTER — Ambulatory Visit (INDEPENDENT_AMBULATORY_CARE_PROVIDER_SITE_OTHER): Payer: Medicare Other | Admitting: Family Medicine

## 2016-08-31 ENCOUNTER — Telehealth: Payer: Self-pay | Admitting: Family Medicine

## 2016-08-31 VITALS — BP 140/70 | HR 68 | Temp 98.0°F | Ht 63.0 in | Wt 146.8 lb

## 2016-08-31 DIAGNOSIS — B9689 Other specified bacterial agents as the cause of diseases classified elsewhere: Secondary | ICD-10-CM | POA: Diagnosis not present

## 2016-08-31 DIAGNOSIS — J208 Acute bronchitis due to other specified organisms: Secondary | ICD-10-CM

## 2016-08-31 MED ORDER — AZITHROMYCIN 250 MG PO TABS: ORAL_TABLET | ORAL | 0 refills | Status: DC

## 2016-08-31 NOTE — Telephone Encounter (Signed)
Patient scheduled appointment 4/26 via My Chart for Monday 4/30. Appointment notes states patient has a fever of 102.  Attempted to contact patient this morning to see if he still has a temp and to get him in today. Phone number listed [phone]) is not a working number.

## 2016-08-31 NOTE — Progress Notes (Signed)
Pre visit review using our clinic review tool, if applicable. No additional management support is needed unless otherwise documented below in the visit note. 

## 2016-08-31 NOTE — Telephone Encounter (Signed)
Called and spoke with the pt and he was seen in the office today by Dr. Wendling.//AB/CMA

## 2016-08-31 NOTE — Progress Notes (Signed)
Chief Complaint  Patient presents with  . Fever    x 2-3 days-along with cough(dry) and runny nose( which started last week)    Awilda Metro here for URI complaints.  Duration: 7 days  Associated symptoms: sinus congestion, rhinorrhea, sore throat and fevers (102 F) and possible rigors Denies: itchy watery eyes, ear pain, ear drainage, shortness of breath and chest pain Treatment to date: Robitussin DM Sick contacts: No  ROS:  Const: +fevers HEENT: As noted in HPI Lungs: No SOB  Past Medical History:  Diagnosis Date  . Frequent headaches   . History of skin cancer   . Hyperlipidemia   . Hypertension    Family History  Problem Relation Age of Onset  . Thyroid cancer Mother   . Emphysema Father   . Lung cancer Sister   . Colon cancer Neg Hx   . Colon polyps Neg Hx   . Esophageal cancer Neg Hx   . Stomach cancer Neg Hx   . Rectal cancer Neg Hx     BP 140/70 (BP Location: Left Arm, Patient Position: Sitting, Cuff Size: Normal)   Pulse 68   Temp 98 F (36.7 C) (Oral)   Ht 5\' 3"  (1.6 m)   Wt 146 lb 12.8 oz (66.6 kg)   SpO2 97%   BMI 26.00 kg/m  General: Awake, alert, appears stated age HEENT: AT, [place], ears patent b/l and TM's neg, nares patent w/o discharge, no sinus TTP, pharynx pink and without exudates, MMM Neck: No masses or asymmetry Heart: RRR, no murmurs, no bruits Lungs: CTAB, no accessory muscle use Psych: Age appropriate judgment and insight, normal mood and affect  Acute bacterial bronchitis - Plan: azithromycin (ZITHROMAX) 250 MG tablet  Orders as above. Will tx given hx of fevers and possible rigors. Continue to push fluids, practice good hand hygiene, cover mouth when coughing. F/u prn. If starting to experience worsening symptoms or shortness of breath, seek immediate care. Pt voiced understanding and agreement to the plan.  [place], DO 08/31/16 12:01 PM

## 2016-08-31 NOTE — Telephone Encounter (Signed)
Attempted to contact patient via Emergency Contact (Spouse Amilia) @ [phone]. Left message on VM for someone to contact our office to inform us if patient is still experiencing elevated temp. Also, sent message via My Chart.

## 2016-08-31 NOTE — Patient Instructions (Signed)
Continue to push fluids, practice good hand hygiene, and cover your mouth if you cough.  If you start having worsening symptoms or shortness of breath, seek immediate care.

## 2016-09-03 ENCOUNTER — Ambulatory Visit: Payer: Self-pay | Admitting: Family Medicine

## 2016-10-02 DIAGNOSIS — M47816 Spondylosis without myelopathy or radiculopathy, lumbar region: Secondary | ICD-10-CM | POA: Diagnosis not present

## 2016-11-28 DIAGNOSIS — B353 Tinea pedis: Secondary | ICD-10-CM | POA: Diagnosis not present

## 2016-11-28 DIAGNOSIS — Z85820 Personal history of malignant melanoma of skin: Secondary | ICD-10-CM | POA: Diagnosis not present

## 2016-11-28 DIAGNOSIS — D1801 Hemangioma of skin and subcutaneous tissue: Secondary | ICD-10-CM | POA: Diagnosis not present

## 2016-11-28 DIAGNOSIS — L821 Other seborrheic keratosis: Secondary | ICD-10-CM | POA: Diagnosis not present

## 2016-11-28 DIAGNOSIS — L820 Inflamed seborrheic keratosis: Secondary | ICD-10-CM | POA: Diagnosis not present

## 2016-11-28 DIAGNOSIS — D225 Melanocytic nevi of trunk: Secondary | ICD-10-CM | POA: Diagnosis not present

## 2016-11-28 DIAGNOSIS — L570 Actinic keratosis: Secondary | ICD-10-CM | POA: Diagnosis not present

## 2016-12-17 ENCOUNTER — Telehealth: Payer: Self-pay | Admitting: Family Medicine

## 2016-12-17 NOTE — Telephone Encounter (Signed)
Remind [place] where his hernia is again? I am OK with referring once we have dx. TY.

## 2016-12-17 NOTE — Telephone Encounter (Signed)
Called and spoke with the pt and he stated the hernia is in the (L) inguinal area.//AB/CMA

## 2016-12-17 NOTE — Telephone Encounter (Signed)
Relation to MH:[facility] Call back [place]   Reason for call:  Patient requesting referral to  general surgery due to hernia follow up, please advise

## 2016-12-17 NOTE — Telephone Encounter (Signed)
Actually I have to see him. I have never examined him for this, I thought I had in the past, I apologize for the miscommunication. TY.

## 2016-12-18 NOTE — Telephone Encounter (Signed)
Notified pt and scheduled appt with PCP on Thursday at 2pm.

## 2016-12-20 ENCOUNTER — Encounter: Payer: Self-pay | Admitting: Family Medicine

## 2016-12-20 ENCOUNTER — Ambulatory Visit (INDEPENDENT_AMBULATORY_CARE_PROVIDER_SITE_OTHER): Payer: Medicare Other | Admitting: Family Medicine

## 2016-12-20 VITALS — BP 134/68 | HR 73 | Temp 98.3°F | Ht 63.0 in | Wt 150.0 lb

## 2016-12-20 DIAGNOSIS — Z8719 Personal history of other diseases of the digestive system: Secondary | ICD-10-CM

## 2016-12-20 DIAGNOSIS — Z87898 Personal history of other specified conditions: Secondary | ICD-10-CM

## 2016-12-20 DIAGNOSIS — I951 Orthostatic hypotension: Secondary | ICD-10-CM | POA: Diagnosis not present

## 2016-12-20 DIAGNOSIS — M7062 Trochanteric bursitis, left hip: Secondary | ICD-10-CM

## 2016-12-20 DIAGNOSIS — R1032 Left lower quadrant pain: Secondary | ICD-10-CM | POA: Diagnosis not present

## 2016-12-20 NOTE — Progress Notes (Signed)
Chief Complaint  Patient presents with  . Inguinal Hernia    Patient is here today C/O inguinal hernia left sided.  Had surgical repair with mesh on 6.07.2016. Painful in that airea with movement.  . Dizziness    Patient also states that he started with some slight dizziness and says "my head feels congestion."    Ronnie Payne is 76 y.o. pt here for dizziness.  Duration: 1 day; lasted for several seconds, feels "congested" today but has a hard time describing Pass out? No Spinning? No Recent illness/fever? No Headache? No Neurologic signs? No- specifically denies trouble with speech, difficulty swallowing, falling, numbness, tingling, weakness, or vision changes Change in PO intake? No   He also has a hx of a L inguinal hernia that was repaired in 2016 by a surgeon in [place]. He had continued pain post-op. Just prior to moving, he states that the surgeon wanted to open him up again and cut a nerve that was potentially trapped. He has his op report today. He has had continued pain and now has outer hip pain. His activity level has plummeted due to this. He denies any fevers, bowel changes, skin changes or bulges. The pt is requesting to see a general surgeon for a second opinion.  ROS:  Neuro: As noted in HPI Eyes: No vision changes  Past Medical History:  Diagnosis Date  . Frequent headaches   . History of skin cancer   . Hyperlipidemia   . Hypertension     Family History  Problem Relation Age of Onset  . Thyroid cancer Mother   . Emphysema Father   . Lung cancer Sister   . Colon cancer Neg Hx   . Colon polyps Neg Hx   . Esophageal cancer Neg Hx   . Stomach cancer Neg Hx   . Rectal cancer Neg Hx     Allergies as of 12/20/2016   No Known Allergies     Medication List       Accurate as of 12/20/16  8:50 PM. Always use your most recent med list.          atenolol 50 MG tablet Commonly known as:  TENORMIN Take 1 tablet by mouth daily.   atorvastatin 20 MG  tablet Commonly known as:  LIPITOR Take 20 mg by mouth daily.   cholecalciferol 1000 units tablet Commonly known as:  VITAMIN D Take 1,000 Units by mouth daily.   multivitamin-iron-minerals-folic acid chewable tablet Chew 1 tablet by mouth daily.       BP 134/68 (BP Location: Right Arm, Patient Position: Sitting, Cuff Size: Normal)   Pulse 73   Temp 98.3 F (36.8 C) (Oral)   Ht 5\' 3"  (1.6 m)   Wt 150 lb (68 kg)   SpO2 98%   BMI 26.57 kg/m  General: Awake, alert, appears stated age Eyes: PERRLA, EOMi Ears: Patent, TM's neg b/l Heart: RRR, no murmurs, no carotid bruits Lungs: CTAB, no accessory muscle use MSK: 5/5 strength throughout, gait normal; +TTP over the L greater troch bursa, no TTP with leg abduction resisted GU: I do not appreciated any bulges or hernia on exam, no skin changes Neuro: No cerebellar signs, patellar reflex 2/4 b/l wo clonus, calcaneal reflex 0/4 b/l wo clonus, biceps reflex 1/4 b/l wo clonus Psych: Age appropriate judgment and insight, normal mood and affect  History of inguinal hernia - Plan: Ambulatory referral to General Surgery  Left inguinal pain - Plan: Ambulatory referral to General Surgery  Greater  trochanteric bursitis of left hip  Orthostasis  History of elevated PSA - Plan: Ambulatory referral to Urology  Orders as above. Will refer to Gen Surg for opinion. We will scan his report and he will bring it to his appt. I did discuss the possibility of this being related to hip OA, but he states that given the timing and progression, he would like to rule out any issue with the surgery he had before working this up further. Discussed ice and Tylenol vs steroid injection for bursitis. He would like to see if fixing his inguinal issue affects his activty and gait to heal the inflammation over bursa. He has hx of elevated PSA, medicare waiver keeps coming up. He would rather see a urologist. OK. Regarding his vague dizziness episode, there are  no red flag signs or symptoms in his hx, which is very vague, or his exam. Recommended adequate hydration and to eat regular meals. Let us know if anything changes or if this fails to resolve.  F/u prn. Pt voiced understanding and agreement to the plan.  [place], DO 12/20/16 8:50 PM

## 2016-12-20 NOTE — Patient Instructions (Signed)
If you wish to get an injection, schedule an appt with [place].  If you do not hear anything about your referral in the next week, call our office and ask for an update.  Let us know if you need anything.

## 2017-01-02 ENCOUNTER — Other Ambulatory Visit: Payer: Self-pay

## 2017-01-02 ENCOUNTER — Encounter: Payer: Self-pay | Admitting: Family Medicine

## 2017-01-02 MED ORDER — ATENOLOL 50 MG PO TABS: 50.0000 mg | ORAL_TABLET | Freq: Every day | ORAL | 1 refills | Status: DC

## 2017-01-04 ENCOUNTER — Other Ambulatory Visit: Payer: Self-pay | Admitting: General Surgery

## 2017-01-04 DIAGNOSIS — R1032 Left lower quadrant pain: Secondary | ICD-10-CM

## 2017-01-10 ENCOUNTER — Ambulatory Visit
Admission: RE | Admit: 2017-01-10 | Discharge: 2017-01-10 | Disposition: A | Discharge status: Home / Self Care | Payer: Medicare Other | Source: Ambulatory Visit | Attending: General Surgery | Admitting: General Surgery

## 2017-01-10 DIAGNOSIS — K7689 Other specified diseases of liver: Secondary | ICD-10-CM | POA: Diagnosis not present

## 2017-01-10 DIAGNOSIS — R1032 Left lower quadrant pain: Secondary | ICD-10-CM

## 2017-01-10 IMAGING — CT CT ABD-PELV W/ CM
1 of 3 series · 14 of 32 positions shown, 19 images · IV contrast (APPLIED)
Comparison: CT scan of [DATE].

CLINICAL DATA: Left groin pain.

EXAM:
CT ABDOMEN AND PELVIS WITH CONTRAST
TECHNIQUE: Multidetector CT imaging of the abdomen and pelvis was performed
using the standard protocol following bolus administration of
intravenous contrast.
CONTRAST:  100 mL of Isovue 300 intravenously.

[Series 2: abd/pelvis w/cm · axial · 0.73mm/px · z∈[-486,-61]mm · 14 of 97 slices shown, 19 images]
[im 6/97  soft-tissue]
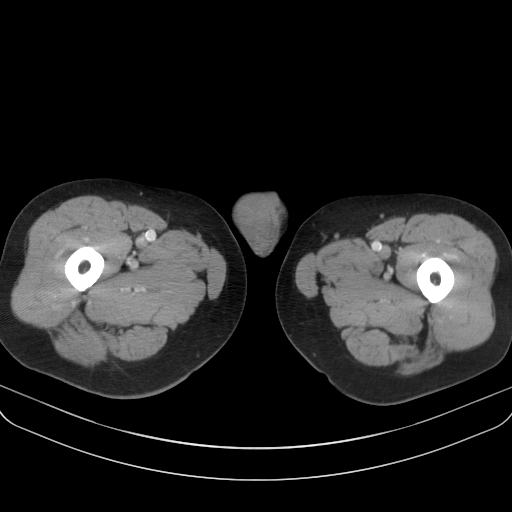
[im 6/97  bone]
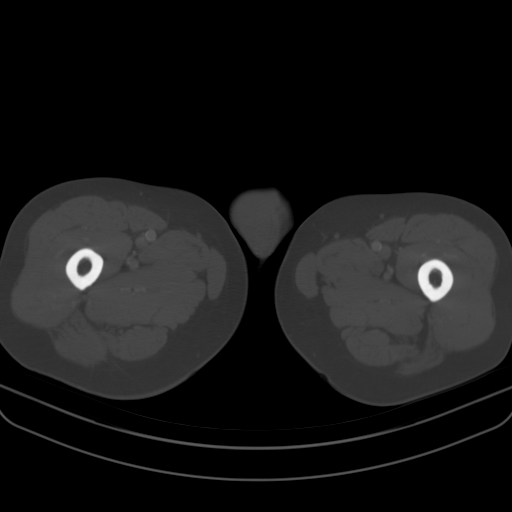
[im 16/97  soft-tissue]
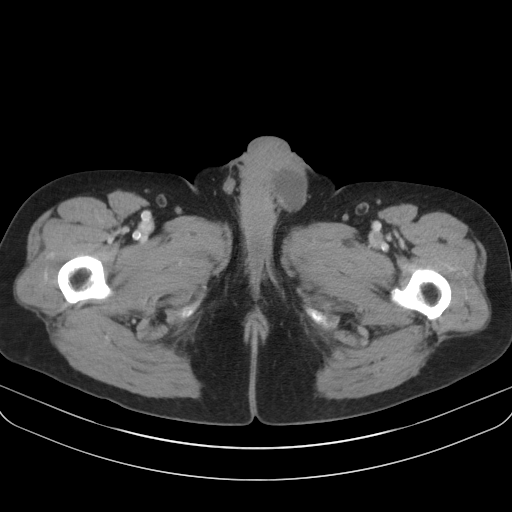
[im 21/97  soft-tissue]
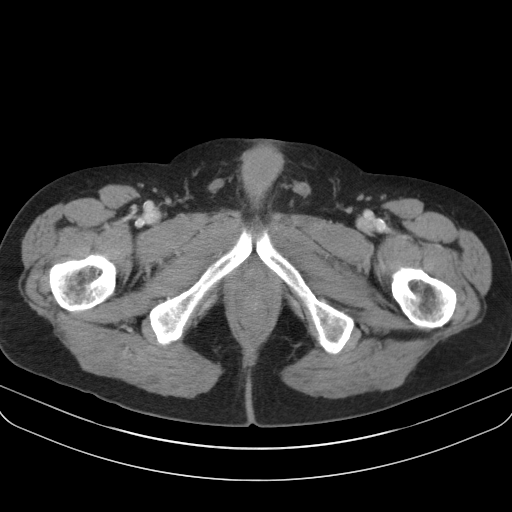
[im 26/97  soft-tissue]
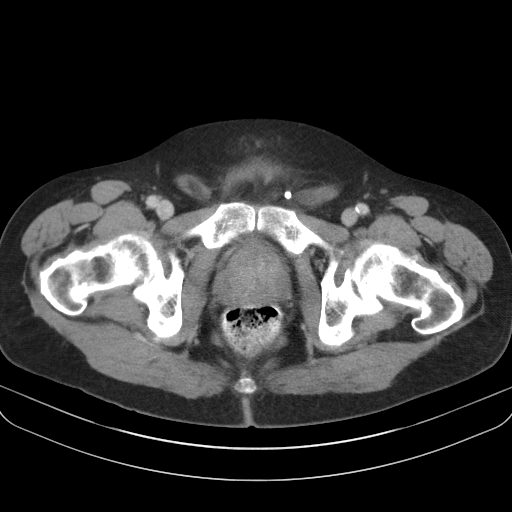
[im 36/97  soft-tissue]
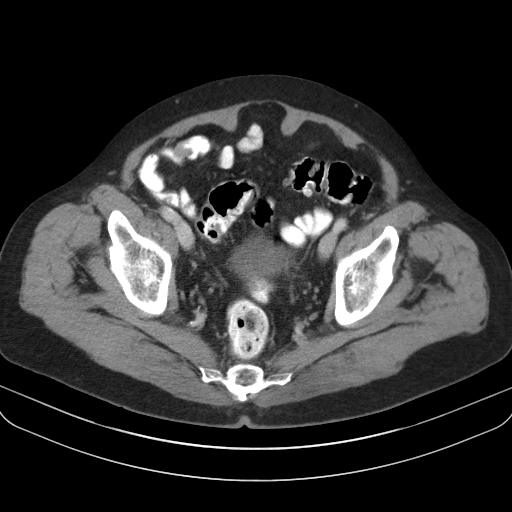
[im 41/97  soft-tissue]
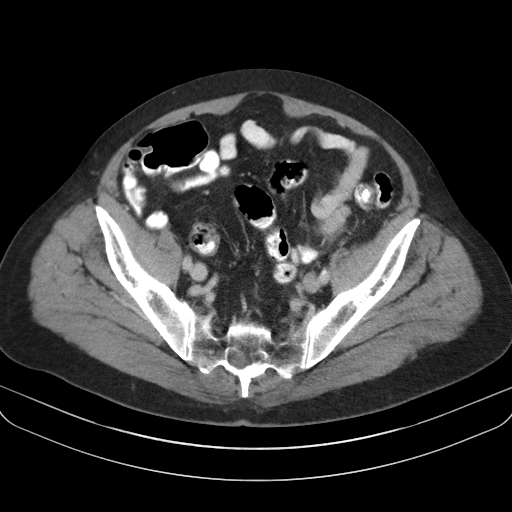
[im 51/97  soft-tissue]
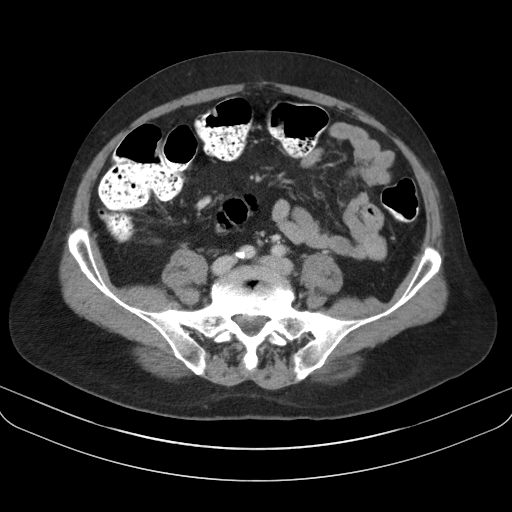
[im 56/97  soft-tissue]
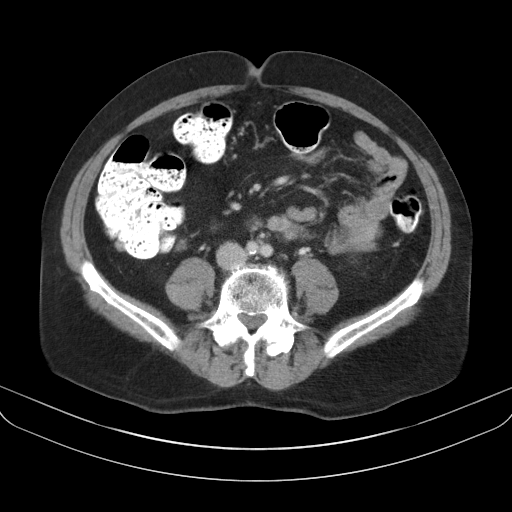
[im 61/97  soft-tissue]
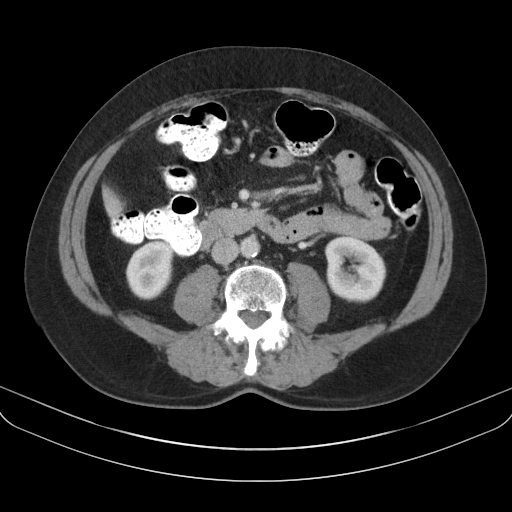
[im 61/97  bone]
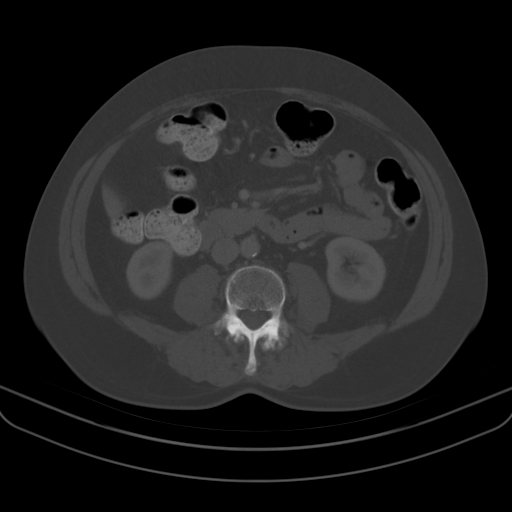
[im 71/97  soft-tissue]
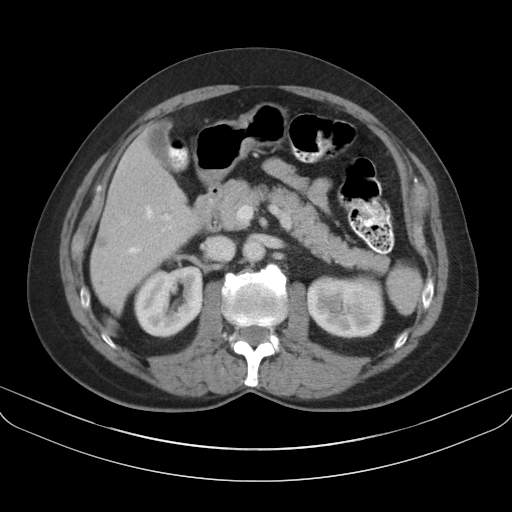
[im 76/97  soft-tissue]
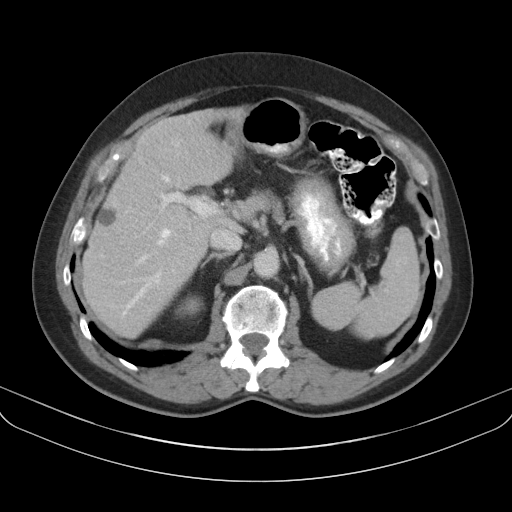
[im 76/97  lung]
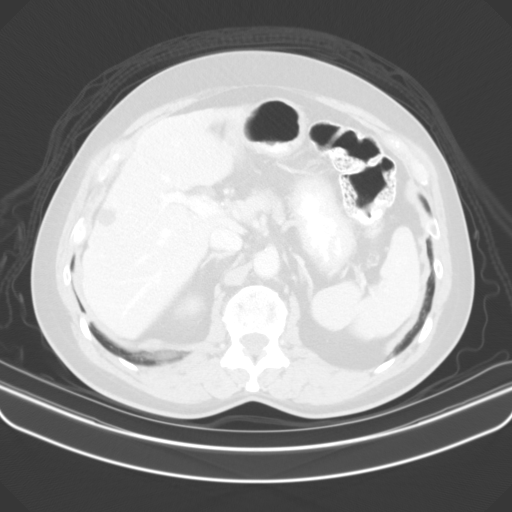
[im 81/97  soft-tissue]
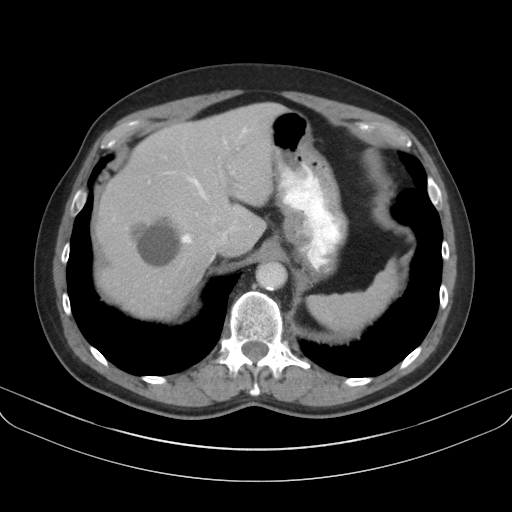
[im 81/97  lung]
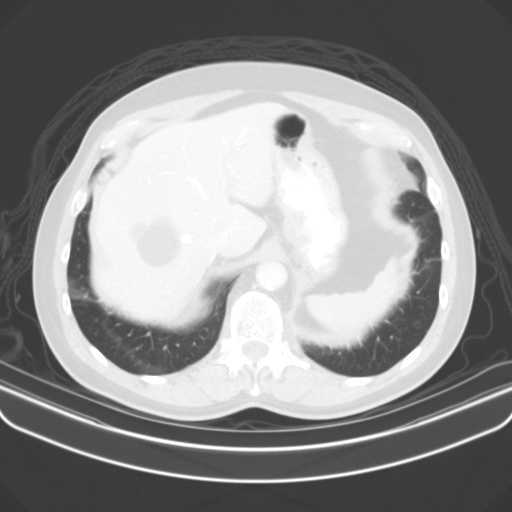
[im 86/97  lung]
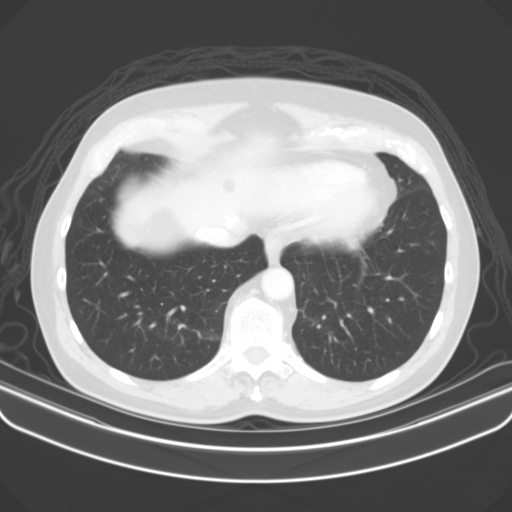
[im 91/97  soft-tissue]
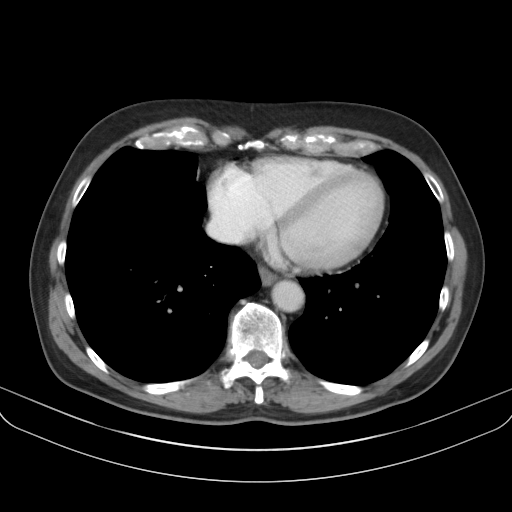
[im 91/97  lung]
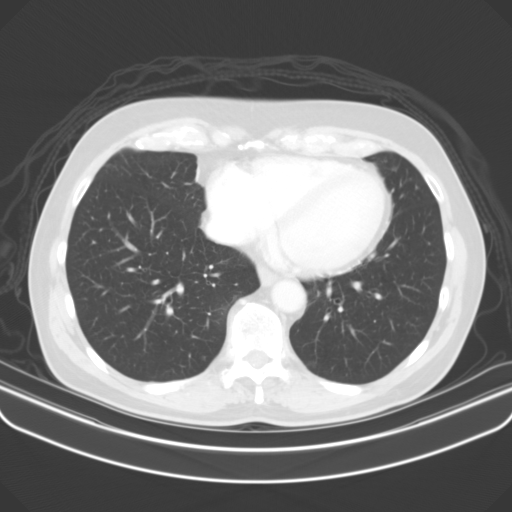

[14 of 32 positions shown; findings below may reference images not displayed]

FINDINGS: Lower chest: No acute abnormality.

Hepatobiliary: No gallstones are noted. Stable hepatic cysts are
noted.

Pancreas: Unremarkable. No pancreatic ductal dilatation or
surrounding inflammatory changes.

Spleen: Normal in size without focal abnormality.

Adrenals/Urinary Tract: Adrenal glands are unremarkable. Kidneys are
normal, without renal calculi, focal lesion, or hydronephrosis.
Bladder is unremarkable.

Stomach/Bowel: Stomach is within normal limits. Appendix appears
normal. No evidence of bowel wall thickening, distention, or
inflammatory changes.

Vascular/Lymphatic: Aortic atherosclerosis. No enlarged abdominal or
pelvic lymph nodes.

Reproductive: Stable mild prostatomegaly.

Other: No abdominal wall hernia or abnormality. No abdominopelvic
ascites.

Musculoskeletal: No acute or significant osseous findings.
IMPRESSION: Stable hepatic cysts.

Aortic atherosclerosis.

Stable mild prostatic enlargement.

No acute abnormality seen in the abdomen or pelvis.

## 2017-01-10 MED ORDER — IOPAMIDOL (ISOVUE-300) INJECTION 61%
100.0000 mL | Freq: Once | INTRAVENOUS | Status: AC | PRN
  Administered 2017-01-10: 100 mL via INTRAVENOUS

## 2017-01-11 ENCOUNTER — Other Ambulatory Visit: Payer: Self-pay | Admitting: Family Medicine

## 2017-01-11 DIAGNOSIS — R972 Elevated prostate specific antigen [PSA]: Secondary | ICD-10-CM

## 2017-02-07 ENCOUNTER — Encounter: Payer: Self-pay | Admitting: Family Medicine

## 2017-02-11 DIAGNOSIS — R351 Nocturia: Secondary | ICD-10-CM | POA: Diagnosis not present

## 2017-02-11 DIAGNOSIS — Z125 Encounter for screening for malignant neoplasm of prostate: Secondary | ICD-10-CM | POA: Diagnosis not present

## 2017-02-11 DIAGNOSIS — R109 Unspecified abdominal pain: Secondary | ICD-10-CM | POA: Diagnosis not present

## 2017-02-11 DIAGNOSIS — N401 Enlarged prostate with lower urinary tract symptoms: Secondary | ICD-10-CM | POA: Diagnosis not present

## 2017-02-11 DIAGNOSIS — R972 Elevated prostate specific antigen [PSA]: Secondary | ICD-10-CM | POA: Diagnosis not present

## 2017-02-13 DIAGNOSIS — M67911 Unspecified disorder of synovium and tendon, right shoulder: Secondary | ICD-10-CM | POA: Diagnosis not present

## 2017-02-19 ENCOUNTER — Ambulatory Visit (INDEPENDENT_AMBULATORY_CARE_PROVIDER_SITE_OTHER): Payer: Medicare Other

## 2017-02-19 DIAGNOSIS — Z23 Encounter for immunization: Secondary | ICD-10-CM

## 2017-03-07 NOTE — Progress Notes (Signed)
Appreciate injections of vaccinations.

## 2017-03-08 NOTE — Progress Notes (Signed)
Noted. Agree with above.  [place], DO 03/08/17 9:44 AM

## 2017-03-19 DIAGNOSIS — M1612 Unilateral primary osteoarthritis, left hip: Secondary | ICD-10-CM | POA: Diagnosis not present

## 2017-03-19 DIAGNOSIS — M7062 Trochanteric bursitis, left hip: Secondary | ICD-10-CM | POA: Diagnosis not present

## 2017-03-19 DIAGNOSIS — M25552 Pain in left hip: Secondary | ICD-10-CM | POA: Diagnosis not present

## 2017-03-21 DIAGNOSIS — L820 Inflamed seborrheic keratosis: Secondary | ICD-10-CM | POA: Diagnosis not present

## 2017-03-21 DIAGNOSIS — C44519 Basal cell carcinoma of skin of other part of trunk: Secondary | ICD-10-CM | POA: Diagnosis not present

## 2017-03-21 DIAGNOSIS — L821 Other seborrheic keratosis: Secondary | ICD-10-CM | POA: Diagnosis not present

## 2017-03-21 DIAGNOSIS — D1801 Hemangioma of skin and subcutaneous tissue: Secondary | ICD-10-CM | POA: Diagnosis not present

## 2017-03-21 DIAGNOSIS — D485 Neoplasm of uncertain behavior of skin: Secondary | ICD-10-CM | POA: Diagnosis not present

## 2017-03-26 DIAGNOSIS — M25652 Stiffness of left hip, not elsewhere classified: Secondary | ICD-10-CM | POA: Diagnosis not present

## 2017-03-26 DIAGNOSIS — M25552 Pain in left hip: Secondary | ICD-10-CM | POA: Diagnosis not present

## 2017-04-03 DIAGNOSIS — M25552 Pain in left hip: Secondary | ICD-10-CM | POA: Diagnosis not present

## 2017-04-03 DIAGNOSIS — M25652 Stiffness of left hip, not elsewhere classified: Secondary | ICD-10-CM | POA: Diagnosis not present

## 2017-04-08 DIAGNOSIS — M25652 Stiffness of left hip, not elsewhere classified: Secondary | ICD-10-CM | POA: Diagnosis not present

## 2017-04-08 DIAGNOSIS — M25552 Pain in left hip: Secondary | ICD-10-CM | POA: Diagnosis not present

## 2017-04-09 DIAGNOSIS — M1612 Unilateral primary osteoarthritis, left hip: Secondary | ICD-10-CM | POA: Diagnosis not present

## 2017-04-10 DIAGNOSIS — M25552 Pain in left hip: Secondary | ICD-10-CM | POA: Diagnosis not present

## 2017-04-10 DIAGNOSIS — M25652 Stiffness of left hip, not elsewhere classified: Secondary | ICD-10-CM | POA: Diagnosis not present

## 2017-04-17 DIAGNOSIS — M25652 Stiffness of left hip, not elsewhere classified: Secondary | ICD-10-CM | POA: Diagnosis not present

## 2017-04-17 DIAGNOSIS — M25552 Pain in left hip: Secondary | ICD-10-CM | POA: Diagnosis not present

## 2017-04-18 DIAGNOSIS — H31091 Other chorioretinal scars, right eye: Secondary | ICD-10-CM | POA: Diagnosis not present

## 2017-04-18 DIAGNOSIS — H2513 Age-related nuclear cataract, bilateral: Secondary | ICD-10-CM | POA: Diagnosis not present

## 2017-04-22 ENCOUNTER — Ambulatory Visit: Payer: Self-pay | Admitting: Family Medicine

## 2017-04-22 ENCOUNTER — Ambulatory Visit (INDEPENDENT_AMBULATORY_CARE_PROVIDER_SITE_OTHER): Payer: Medicare Other | Admitting: Family Medicine

## 2017-04-22 ENCOUNTER — Encounter: Payer: Self-pay | Admitting: Family Medicine

## 2017-04-22 VITALS — BP 120/64 | HR 70 | Temp 98.3°F | Ht 63.0 in | Wt 152.4 lb

## 2017-04-22 DIAGNOSIS — B9689 Other specified bacterial agents as the cause of diseases classified elsewhere: Secondary | ICD-10-CM

## 2017-04-22 DIAGNOSIS — J208 Acute bronchitis due to other specified organisms: Secondary | ICD-10-CM

## 2017-04-22 DIAGNOSIS — I10 Essential (primary) hypertension: Secondary | ICD-10-CM

## 2017-04-22 MED ORDER — AZITHROMYCIN 250 MG PO TABS: ORAL_TABLET | ORAL | 0 refills | Status: DC

## 2017-04-22 MED ORDER — METOPROLOL SUCCINATE ER 50 MG PO TB24: 50.0000 mg | ORAL_TABLET | Freq: Every day | ORAL | 1 refills | Status: DC

## 2017-04-22 NOTE — Patient Instructions (Addendum)
Continue to push fluids, practice good hand hygiene, and cover your mouth if you cough.  If you start having fevers, shaking or shortness of breath, seek immediate care.  Stop Atenolol.   Let us know if you need anything.

## 2017-04-22 NOTE — Progress Notes (Signed)
Chief Complaint  Patient presents with  . Cough    congestion    Awilda Metro here for URI complaints.  Duration: 3 weeks  Associated symptoms: sinus congestion, rhinorrhea, sore throat and cough Denies: sinus pain, itchy watery eyes, ear pain, ear drainage, sore throat, wheezing, shortness of breath, myalgia and fevers/rigors Treatment to date: robitussin, honey, dextromethorphan Sick contacts: No   Hypertension Patient presents for hypertension follow up. He is compliant with medications. Atenolol  Patient has these side effects of medication: nightmares He is adhering to a healthy diet overall.  ROS:  Const: Denies fevers HEENT: As noted in HPI Lungs: No SOB  Past Medical History:  Diagnosis Date  . Frequent headaches   . History of skin cancer   . Hyperlipidemia   . Hypertension    Family History  Problem Relation Age of Onset  . Thyroid cancer Mother   . Emphysema Father   . Lung cancer Sister   . Colon cancer Neg Hx   . Colon polyps Neg Hx   . Esophageal cancer Neg Hx   . Stomach cancer Neg Hx   . Rectal cancer Neg Hx     BP 120/64 (BP Location: Left Arm, Patient Position: Sitting, Cuff Size: Normal)   Pulse 70   Temp 98.3 F (36.8 C) (Oral)   Ht 5\' 3"  (1.6 m)   Wt 152 lb 6 oz (69.1 kg)   SpO2 96%   BMI 26.99 kg/m  General: Awake, alert, appears stated age HEENT: AT, [place], ears patent b/l and TM's neg, nares patent w/o discharge, pharynx pink and without exudates, MMM Neck: No masses or asymmetry Heart: RRR, no murmurs, no bruits Lungs: coarse breath sounds in RLL field, no accessory muscle use Psych: Age appropriate judgment and insight, normal mood and affect  Acute bacterial bronchitis - Plan: azithromycin (ZITHROMAX) 250 MG tablet  Essential hypertension - Plan: metoprolol succinate (TOPROL-XL) 50 MG 24 hr tablet  Orders as above. Change Atenolol to Metoprolol.  Continue to push fluids, practice good hand hygiene, cover mouth when  coughing. F/u prn. If starting to experience fevers, shaking, or shortness of breath, seek immediate care. Pt voiced understanding and agreement to the plan.  [place], DO 04/22/17 3:30 PM

## 2017-04-22 NOTE — Progress Notes (Signed)
Pre visit review using our clinic review tool, if applicable. No additional management support is needed unless otherwise documented below in the visit note. 

## 2017-04-25 DIAGNOSIS — M1612 Unilateral primary osteoarthritis, left hip: Secondary | ICD-10-CM | POA: Diagnosis not present

## 2017-05-13 DIAGNOSIS — M25652 Stiffness of left hip, not elsewhere classified: Secondary | ICD-10-CM | POA: Diagnosis not present

## 2017-05-13 DIAGNOSIS — M25552 Pain in left hip: Secondary | ICD-10-CM | POA: Diagnosis not present

## 2017-05-20 DIAGNOSIS — M1612 Unilateral primary osteoarthritis, left hip: Secondary | ICD-10-CM | POA: Diagnosis not present

## 2017-05-22 DIAGNOSIS — L304 Erythema intertrigo: Secondary | ICD-10-CM | POA: Diagnosis not present

## 2017-05-22 DIAGNOSIS — C44519 Basal cell carcinoma of skin of other part of trunk: Secondary | ICD-10-CM | POA: Diagnosis not present

## 2017-05-22 DIAGNOSIS — L905 Scar conditions and fibrosis of skin: Secondary | ICD-10-CM | POA: Diagnosis not present

## 2017-05-29 ENCOUNTER — Ambulatory Visit (INDEPENDENT_AMBULATORY_CARE_PROVIDER_SITE_OTHER): Payer: Medicare Other | Admitting: Family Medicine

## 2017-05-29 ENCOUNTER — Encounter: Payer: Self-pay | Admitting: Family Medicine

## 2017-05-29 VITALS — BP 120/82 | HR 59 | Temp 97.4°F | Ht 64.0 in | Wt 149.4 lb

## 2017-05-29 DIAGNOSIS — I10 Essential (primary) hypertension: Secondary | ICD-10-CM | POA: Diagnosis not present

## 2017-05-29 MED ORDER — METOPROLOL SUCCINATE ER 25 MG PO TB24: 25.0000 mg | ORAL_TABLET | Freq: Every day | ORAL | 1 refills | Status: DC

## 2017-05-29 NOTE — Patient Instructions (Signed)
Cancel appointment if you are doing better with your home blood pressure readings.  Come to your next appointment fasting.  Let us know if you need anything.

## 2017-05-29 NOTE — Progress Notes (Signed)
Chief Complaint  Patient presents with  . Hypertension    Subjective Ronnie Payne is a 77 y.o. male who presents for hypertension follow up. He does monitor home blood pressures. Blood pressures ranging from 120-140's/70-80's on average. He is compliant with medications- metoprolol succ 50 mg/d, recently switched from atenolol due to ae's. Patient has these side effects of medication: none He is adhering to a healthy diet overall. Current exercise: goes to [facility]   Past Medical History:  Diagnosis Date  . Frequent headaches   . History of skin cancer   . Hyperlipidemia   . Hypertension    Review of Systems Cardiovascular: no chest pain Respiratory:  no shortness of breath  Exam BP 120/82 (BP Location: Left Arm, Patient Position: Sitting, Cuff Size: Normal)   Pulse (!) 59   Temp (!) 97.4 F (36.3 C) (Oral)   Ht 5\' 4"  (1.626 m)   Wt 149 lb 6 oz (67.8 kg)   SpO2 99%   BMI 25.64 kg/m  General:  well developed, well nourished, in no apparent distress Skin: warm, no pallor or diaphoresis Eyes: pupils equal and round, sclera anicteric without injection Heart: RRR, no bruits, no LE edema Lungs: clear to auscultation, no accessory muscle use Psych: well oriented with normal range of affect and appropriate judgment/insight  Essential hypertension - Plan: metoprolol succinate (TOPROL-XL) 25 MG 24 hr tablet  Orders as above. Increase to 75 mg/d as pt wishes to have home bp's lower Counseled on diet and exercise F/u in 6 weeks, cancel if doing better. The patient voiced understanding and agreement to the plan.  [place], DO 05/29/17  12:40 PM

## 2017-05-29 NOTE — Progress Notes (Signed)
Pre visit review using our clinic review tool, if applicable. No additional management support is needed unless otherwise documented below in the visit note. 

## 2017-06-10 ENCOUNTER — Encounter: Payer: Self-pay | Admitting: Family Medicine

## 2017-06-11 DIAGNOSIS — M5126 Other intervertebral disc displacement, lumbar region: Secondary | ICD-10-CM | POA: Diagnosis not present

## 2017-06-11 DIAGNOSIS — M545 Low back pain: Secondary | ICD-10-CM | POA: Diagnosis not present

## 2017-06-13 ENCOUNTER — Ambulatory Visit (INDEPENDENT_AMBULATORY_CARE_PROVIDER_SITE_OTHER): Payer: Medicare Other | Admitting: Family Medicine

## 2017-06-13 ENCOUNTER — Encounter: Payer: Self-pay | Admitting: Family Medicine

## 2017-06-13 VITALS — BP 148/82 | HR 74 | Temp 98.0°F | Ht 64.0 in | Wt 149.1 lb

## 2017-06-13 DIAGNOSIS — R0789 Other chest pain: Secondary | ICD-10-CM | POA: Diagnosis not present

## 2017-06-13 DIAGNOSIS — I10 Essential (primary) hypertension: Secondary | ICD-10-CM | POA: Diagnosis not present

## 2017-06-13 MED ORDER — ATENOLOL 50 MG PO TABS: 50.0000 mg | ORAL_TABLET | Freq: Every day | ORAL | 1 refills | Status: DC

## 2017-06-13 NOTE — Progress Notes (Signed)
Chief Complaint  Patient presents with  . Hypertension    Subjective Ronnie Payne is a 77 y.o. male who presents for hypertension follow up. He does monitor home blood pressures. Blood pressures ranging from 130-140's/70-80's on average. He is compliant with medications-metoprolol XL 75 mg daily. Patient has these side effects of medication: Chest pressure, nightmares, fatigue He is adhering to a healthy diet overall. Current exercise: walking  Duration of issue: happened once over this past weekend Quality: pressure Palliation: none Provocation: none Severity: 3/10 Radiation: no Duration of chest pain: 4 hours Associated symptoms: None Cardiac history: HTN, HLD Family heart history: no Smoker? No     Past Medical History:  Diagnosis Date  . Frequent headaches   . History of skin cancer   . Hyperlipidemia   . Hypertension    Family History  Problem Relation Age of Onset  . Thyroid cancer Mother   . Emphysema Father   . Lung cancer Sister   . Colon cancer Neg Hx   . Colon polyps Neg Hx   . Esophageal cancer Neg Hx   . Stomach cancer Neg Hx   . Rectal cancer Neg Hx     Medications Current Outpatient Medications on File Prior to Visit  Medication Sig Dispense Refill  . atorvastatin (LIPITOR) 20 MG tablet Take 20 mg by mouth daily.    . cholecalciferol (VITAMIN D) 1000 units tablet Take 1,000 Units by mouth daily.    . multivitamin-iron-minerals-folic acid (CENTRUM) chewable tablet Chew 1 tablet by mouth daily.     No current facility-administered medications on file prior to visit.     Allergies No Known Allergies  Review of Systems Cardiovascular: no chest pain Respiratory:  no shortness of breath  Exam BP (!) 148/82 (BP Location: Left Arm, Patient Position: Sitting, Cuff Size: Normal)   Pulse 74   Temp 98 F (36.7 C) (Oral)   Ht 5\' 4"  (1.626 m)   Wt 149 lb 2 oz (67.6 kg)   SpO2 95%   BMI 25.60 kg/m  General:  well developed, well nourished,  in no apparent distress Skin: warm, no pallor or diaphoresis Eyes: pupils equal and round, sclera anicteric without injection Heart: RRR, no bruits, no LE edema Lungs: clear to auscultation, no accessory muscle use Psych: well oriented with normal range of affect and appropriate judgment/insight  Atypical chest pain - Plan: EKG 12-Lead  Essential hypertension - Plan: atenolol (TENORMIN) 50 MG tablet, EKG 12-Lead  Orders as above. Pt thinks side effects of med outweighs benefits, would like to go back to atenolol. I offered to change him to something else, but he wishes for the atenolol.  EKG unremarkable for ischemia.  Counseled on diet and exercise F/u in 6 weeks. The patient voiced understanding and agreement to the plan.  [place], DO 06/13/17  12:05 PM

## 2017-06-13 NOTE — Progress Notes (Signed)
Pre visit review using our clinic review tool, if applicable. No additional management support is needed unless otherwise documented below in the visit note. 

## 2017-06-13 NOTE — Patient Instructions (Addendum)
Stop the metoprolol.   Your EKG looks normal/good.  Let me know if you have chest pains or issues with your medicine moving forward.  Let us know if you need anything.

## 2017-06-19 DIAGNOSIS — M545 Low back pain: Secondary | ICD-10-CM | POA: Diagnosis not present

## 2017-06-19 DIAGNOSIS — M5126 Other intervertebral disc displacement, lumbar region: Secondary | ICD-10-CM | POA: Diagnosis not present

## 2017-07-03 DIAGNOSIS — M5126 Other intervertebral disc displacement, lumbar region: Secondary | ICD-10-CM | POA: Diagnosis not present

## 2017-07-03 DIAGNOSIS — M545 Low back pain: Secondary | ICD-10-CM | POA: Diagnosis not present

## 2017-07-09 DIAGNOSIS — M5126 Other intervertebral disc displacement, lumbar region: Secondary | ICD-10-CM | POA: Diagnosis not present

## 2017-07-09 DIAGNOSIS — M545 Low back pain: Secondary | ICD-10-CM | POA: Diagnosis not present

## 2017-07-09 NOTE — Progress Notes (Signed)
Subjective:   Ronnie Payne is a 77 y.o. male who presents for an Initial Medicare Annual Wellness Visit. Pt is very pleasant.   Review of Systems No ROS.  Medicare Wellness Visit. Additional risk factors are reflected in the social history.    Sleep patterns: sleeps well Home Safety/Smoke Alarms: Feels safe in home. Smoke alarms in place.  Living environment; residence and Firearm Safety: 3rd floor condo with wife.  Seat Belt Safety/Bike Helmet: Wears seat belt.  Male:  CCS- last reported 04/26/11    PSA- No results found for: PSA Eye- Dr.Scott Payne yearly. New glasses rx 04/2017. Dentist-Dr.Andrea Payne every 6 months. [name], [place]   Objective:    Today's Vitals   07/11/17 0902  BP: 138/64  Pulse: (!) 59  SpO2: 98%  Weight: 147 lb 3.2 oz (66.8 kg)  Height: 5' 5.5" (1.664 m)   Body mass index is 24.12 kg/m.  Advanced Directives 07/11/2017 05/30/2016  Does Patient Have a Medical Advance Directive? Yes Yes  Type of Paramedic of [place];Living will [place];Living will  Does patient want to make changes to medical advance directive? No - Patient declined -  Copy of [place] in Chart? No - copy requested -    Current Medications (verified) Outpatient Encounter Medications as of 07/11/2017  Medication Sig  . atenolol (TENORMIN) 50 MG tablet Take 1 tablet (50 mg total) by mouth daily.  Marland Kitchen atorvastatin (LIPITOR) 20 MG tablet Take 20 mg by mouth daily.  . cholecalciferol (VITAMIN D) 1000 units tablet Take 1,000 Units by mouth daily.  . multivitamin-iron-minerals-folic acid (CENTRUM) chewable tablet Chew 1 tablet by mouth daily.   No facility-administered encounter medications on file as of 07/11/2017.     Allergies (verified) Patient has no known allergies.   History: Past Medical History:  Diagnosis Date  . Frequent headaches   . History of skin cancer   . Hyperlipidemia   . Hypertension     Past Surgical History:  Procedure Laterality Date  . INGUINAL HERNIA REPAIR  10/06/2014  . TRANSURETHRAL RESECTION OF PROSTATE  2000-2012   Family History  Problem Relation Age of Onset  . Thyroid cancer Mother   . Emphysema Father   . Lung cancer Sister   . Colon cancer Neg Hx   . Colon polyps Neg Hx   . Esophageal cancer Neg Hx   . Stomach cancer Neg Hx   . Rectal cancer Neg Hx    Social History   Socioeconomic History  . Marital status: Married    Spouse name: None  . Number of children: 1  . Years of education: None  . Highest education level: None  Social Needs  . Financial resource strain: None  . Food insecurity - worry: None  . Food insecurity - inability: None  . Transportation needs - medical: None  . Transportation needs - non-medical: None  Occupational History  . Occupation: retired    Comment: Optometrist  Tobacco Use  . Smoking status: Never Smoker  . Smokeless tobacco: Never Used  Substance and Sexual Activity  . Alcohol use: No  . Drug use: No  . Sexual activity: Yes  Other Topics Concern  . None  Social History Narrative  . None   Tobacco Counseling Counseling given: Not Answered   Clinical Intake:     Pain : No/denies pain  Activities of Daily Living In your present state of health, do you have any difficulty performing the  following activities: 07/11/2017  Hearing? N  Vision? N  Difficulty concentrating or making decisions? N  Walking or climbing stairs? N  Dressing or bathing? N  Doing errands, shopping? N  Preparing Food and eating ? N  Using the Toilet? N  In the past six months, have you accidently leaked urine? N  Do you have problems with loss of bowel control? N  Managing your Medications? N  Managing your Finances? N  Housekeeping or managing your Housekeeping? N  Some recent data might be hidden     Immunizations and Health Maintenance Immunization History  Administered Date(s) Administered  . Influenza, High  Dose Seasonal PF 01/06/2016, 02/19/2017  . Pneumococcal Polysaccharide-23 02/19/2017  . Pneumococcal-Unspecified 02/05/2015   Health Maintenance Due  Topic Date Due  . Samul Dada  08/09/1959    Patient Care Team: Shelda Pal, DO as PCP - General (Family Medicine)  Indicate any recent Medical Services you may have received from other than [facility] providers in the past year (date may be approximate).    Assessment:   This is a routine wellness examination for [facility]. Physical assessment deferred to PCP.  Hearing/Vision screen  Visual Acuity Screening   Right eye Left eye Both eyes  Without correction:     With correction: 20/20 20/20 20/20   Hearing Screening Comments: Able to hear conversational tones w/o difficulty. No issues reported.  Passes whisper test.   Dietary issues and exercise activities discussed: Current Exercise Habits: Structured exercise class, Type of exercise: treadmill;strength training/weights, Time (Minutes): 30, Frequency (Times/Week): 3, Weekly Exercise (Minutes/Week): 90, Intensity: Mild Diet (meal preparation, eat out, water intake, caffeinated beverages, dairy products, fruits and vegetables): well balanced, on average, 3 meals per day Breakfast: Coffee and bread, cake, and crackers with cheese. Lunch: meat,vegetable, rice Dinner: lighter dinner. Drinks water. No soda.       Goals    . Continue exercising.      Depression Screen PHQ 2/9 Scores 07/11/2017 04/25/2016  PHQ - 2 Score 0 0    Fall Risk Fall Risk  07/11/2017 04/25/2016  Falls in the past year? No No   Cognitive Function: MMSE - Mini Mental State Exam 07/11/2017  Orientation to time 5  Orientation to Place 5  Registration 3  Attention/ Calculation 5  Recall 2  Language- name 2 objects 2  Language- repeat 1  Language- follow 3 step command 3  Language- read & follow direction 1  Write a sentence 1  Copy design 1  Total score 29        Screening Tests Health  Maintenance  Topic Date Due  . TETANUS/TDAP  08/09/1959  . PNA vac Low Risk Adult (2 of 2 - PCV13) 02/19/2018  . INFLUENZA VACCINE  Completed      Plan:   Follow up with Dr.Wendling as directed  Continue to eat heart healthy diet (full of fruits, vegetables, whole grains, lean protein, water--limit salt, fat, and sugar intake) and increase physical activity as tolerated.  Continue doing brain stimulating activities (puzzles, reading, adult coloring books, staying active) to keep memory sharp.   Bring a copy of your living will and/or healthcare power of attorney to your next office visit.   I have personally reviewed and noted the following in the patient's chart:   . Medical and social history . Use of alcohol, tobacco or illicit drugs  . Current medications and supplements . Functional ability and status . Nutritional status . Physical activity . Advanced directives . List of  other physicians . Hospitalizations, surgeries, and ER visits in previous 12 months . Vitals . Screenings to include cognitive, depression, and falls . Referrals and appointments  In addition, I have reviewed and discussed with patient certain preventive protocols, quality metrics, and best practice recommendations. A written personalized care plan for preventive services as well as general preventive health recommendations were provided to patient.     Shela Nevin, [place]   07/11/2017

## 2017-07-11 ENCOUNTER — Ambulatory Visit: Payer: Medicare Other | Admitting: Family Medicine

## 2017-07-11 ENCOUNTER — Ambulatory Visit (INDEPENDENT_AMBULATORY_CARE_PROVIDER_SITE_OTHER): Payer: Medicare Other | Admitting: *Deleted

## 2017-07-11 ENCOUNTER — Encounter: Payer: Self-pay | Admitting: *Deleted

## 2017-07-11 ENCOUNTER — Ambulatory Visit: Payer: Medicare Other

## 2017-07-11 ENCOUNTER — Other Ambulatory Visit: Payer: Self-pay | Admitting: Family Medicine

## 2017-07-11 VITALS — BP 138/64 | HR 59 | Ht 65.5 in | Wt 147.2 lb

## 2017-07-11 DIAGNOSIS — E785 Hyperlipidemia, unspecified: Secondary | ICD-10-CM | POA: Diagnosis not present

## 2017-07-11 DIAGNOSIS — Z0000 Encounter for general adult medical examination without abnormal findings: Secondary | ICD-10-CM | POA: Diagnosis not present

## 2017-07-11 DIAGNOSIS — I10 Essential (primary) hypertension: Secondary | ICD-10-CM | POA: Diagnosis not present

## 2017-07-11 LAB — COMPREHENSIVE METABOLIC PANEL
ALK PHOS: 72 U/L (ref 39–117)
ALT: 32 U/L (ref 0–53)
AST: 20 U/L (ref 0–37)
Albumin: 4.1 g/dL (ref 3.5–5.2)
BILIRUBIN TOTAL: 0.9 mg/dL (ref 0.2–1.2)
BUN: 18 mg/dL (ref 6–23)
CO2: 32 mEq/L (ref 19–32)
CREATININE: 0.89 mg/dL (ref 0.40–1.50)
Calcium: 9.6 mg/dL (ref 8.4–10.5)
Chloride: 104 mEq/L (ref 96–112)
GFR: 88.11 mL/min (ref >60.00)
GLUCOSE: 93 mg/dL (ref 70–99)
Potassium: 3.8 mEq/L (ref 3.5–5.1)
Sodium: 140 mEq/L (ref 135–145)
TOTAL PROTEIN: 6.2 g/dL (ref 6.0–8.3)

## 2017-07-11 LAB — LIPID PANEL
Cholesterol: 167 mg/dL (ref 0–200)
HDL: 47.8 mg/dL (ref >39.00)
LDL Cholesterol: 100 mg/dL — ABNORMAL HIGH (ref 0–99)
NONHDL: 119.54
Total CHOL/HDL Ratio: 4
Triglycerides: 96 mg/dL (ref 0.0–149.0)
VLDL: 19.2 mg/dL (ref 0.0–40.0)

## 2017-07-11 NOTE — Addendum Note (Signed)
Addended by: Caffie Pinto on: 07/11/2017 09:32 AM   Modules accepted: Orders

## 2017-07-11 NOTE — Progress Notes (Signed)
Labs ordered. Pt here and fasting.

## 2017-07-11 NOTE — Progress Notes (Signed)
Noted. Agree with above.  [place], DO 07/11/17 11:57 AM

## 2017-07-11 NOTE — Patient Instructions (Signed)
Continue to eat heart healthy diet (full of fruits, vegetables, whole grains, lean protein, water--limit salt, fat, and sugar intake) and increase physical activity as tolerated.  Continue doing brain stimulating activities (puzzles, reading, adult coloring books, staying active) to keep memory sharp.   Bring a copy of your living will and/or healthcare power of attorney to your next office visit.  Mr. Ambler , Thank you for taking time to come for your Medicare Wellness Visit. I appreciate your ongoing commitment to your health goals. Please review the following plan we discussed and let me know if I can assist you in the future.   These are the goals we discussed: Goals    . Continue exercising.       This is a list of the screening recommended for you and due dates:  Health Maintenance  Topic Date Due  . Tetanus Vaccine  08/09/1959  . Pneumonia vaccines (2 of 2 - PCV13) 02/19/2018  . Flu Shot  Completed    Health Maintenance, Male A healthy lifestyle and preventive care is important for your health and wellness. Ask your health care provider about what schedule of regular examinations is right for you. What should I know about weight and diet? Eat a Healthy Diet  Eat plenty of vegetables, fruits, whole grains, low-fat dairy products, and lean protein.  Do not eat a lot of foods high in solid fats, added sugars, or salt.  Maintain a Healthy Weight Regular exercise can help you achieve or maintain a healthy weight. You should:  Do at least 150 minutes of exercise each week. The exercise should increase your heart rate and make you sweat (moderate-intensity exercise).  Do strength-training exercises at least twice a week.  Watch Your Levels of Cholesterol and Blood Lipids  Have your blood tested for lipids and cholesterol every 5 years starting at 77 years of age. If you are at high risk for heart disease, you should start having your blood tested when you are 77 years  old. You may need to have your cholesterol levels checked more often if: ? Your lipid or cholesterol levels are high. ? You are older than 77 years of age. ? You are at high risk for heart disease.  What should I know about cancer screening? Many types of cancers can be detected early and may often be prevented. Lung Cancer  You should be screened every year for lung cancer if: ? You are a current smoker who has smoked for at least 30 years. ? You are a former smoker who has quit within the past 15 years.  Talk to your health care provider about your screening options, when you should start screening, and how often you should be screened.  Colorectal Cancer  Routine colorectal cancer screening usually begins at 77 years of age and should be repeated every 5-10 years until you are 77 years old. You may need to be screened more often if early forms of precancerous polyps or small growths are found. Your health care provider may recommend screening at an earlier age if you have risk factors for colon cancer.  Your health care provider may recommend using home test kits to check for hidden blood in the stool.  A small camera at the end of a tube can be used to examine your colon (sigmoidoscopy or colonoscopy). This checks for the earliest forms of colorectal cancer.  Prostate and Testicular Cancer  Depending on your age and overall health, your health care provider may  do certain tests to screen for prostate and testicular cancer.  Talk to your health care provider about any symptoms or concerns you have about testicular or prostate cancer.  Skin Cancer  Check your skin from head to toe regularly.  Tell your health care provider about any new moles or changes in moles, especially if: ? There is a change in a mole's size, shape, or color. ? You have a mole that is larger than a pencil eraser.  Always use sunscreen. Apply sunscreen liberally and repeat throughout the day.  Protect  yourself by wearing long sleeves, pants, a wide-brimmed hat, and sunglasses when outside.  What should I know about heart disease, diabetes, and high blood pressure?  If you are 42-40 years of age, have your blood pressure checked every 3-5 years. If you are 59 years of age or older, have your blood pressure checked every year. You should have your blood pressure measured twice-once when you are at a hospital or clinic, and once when you are not at a hospital or clinic. Record the average of the two measurements. To check your blood pressure when you are not at a hospital or clinic, you can use: ? An automated blood pressure machine at a pharmacy. ? A home blood pressure monitor.  Talk to your health care provider about your target blood pressure.  If you are between 31-43 years old, ask your health care provider if you should take aspirin to prevent heart disease.  Have regular diabetes screenings by checking your fasting blood sugar level. ? If you are at a normal weight and have a low risk for diabetes, have this test once every three years after the age of 50. ? If you are overweight and have a high risk for diabetes, consider being tested at a younger age or more often.  A one-time screening for abdominal aortic aneurysm (AAA) by ultrasound is recommended for men aged 49-75 years who are current or former smokers. What should I know about preventing infection? Hepatitis B If you have a higher risk for hepatitis B, you should be screened for this virus. Talk with your health care provider to find out if you are at risk for hepatitis B infection. Hepatitis C Blood testing is recommended for:  Everyone born from [birth year] through 1965.  Anyone with known risk factors for hepatitis C.  Sexually Transmitted Diseases (STDs)  You should be screened each year for STDs including gonorrhea and chlamydia if: ? You are sexually active and are younger than 77 years of age. ? You are older than 77  years of age and your health care provider tells you that you are at risk for this type of infection. ? Your sexual activity has changed since you were last screened and you are at an increased risk for chlamydia or gonorrhea. Ask your health care provider if you are at risk.  Talk with your health care provider about whether you are at high risk of being infected with HIV. Your health care provider may recommend a prescription medicine to help prevent HIV infection.  What else can I do?  Schedule regular health, dental, and eye exams.  Stay current with your vaccines (immunizations).  Do not use any tobacco products, such as cigarettes, chewing tobacco, and e-cigarettes. If you need help quitting, ask your health care provider.  Limit alcohol intake to no more than 2 drinks per day. One drink equals 12 ounces of beer, 5 ounces of wine, or 1 ounces  of hard liquor.  Do not use street drugs.  Do not share needles.  Ask your health care provider for help if you need support or information about quitting drugs.  Tell your health care provider if you often feel depressed.  Tell your health care provider if you have ever been abused or do not feel safe at home. This information is not intended to replace advice given to you by your health care provider. Make sure you discuss any questions you have with your health care provider. Document Released: 10/20/2007 Document Revised: 12/21/2015 Document Reviewed: 01/25/2015 Elsevier Interactive Patient Education  [employer].

## 2017-07-15 DIAGNOSIS — M545 Low back pain: Secondary | ICD-10-CM | POA: Diagnosis not present

## 2017-07-15 DIAGNOSIS — M5126 Other intervertebral disc displacement, lumbar region: Secondary | ICD-10-CM | POA: Diagnosis not present

## 2017-07-18 ENCOUNTER — Encounter: Payer: Self-pay | Admitting: Family Medicine

## 2017-07-18 DIAGNOSIS — L821 Other seborrheic keratosis: Secondary | ICD-10-CM | POA: Diagnosis not present

## 2017-07-18 DIAGNOSIS — Z85820 Personal history of malignant melanoma of skin: Secondary | ICD-10-CM | POA: Diagnosis not present

## 2017-07-18 DIAGNOSIS — L820 Inflamed seborrheic keratosis: Secondary | ICD-10-CM | POA: Diagnosis not present

## 2017-07-18 DIAGNOSIS — D485 Neoplasm of uncertain behavior of skin: Secondary | ICD-10-CM | POA: Diagnosis not present

## 2017-07-18 DIAGNOSIS — D225 Melanocytic nevi of trunk: Secondary | ICD-10-CM | POA: Diagnosis not present

## 2017-07-18 DIAGNOSIS — L910 Hypertrophic scar: Secondary | ICD-10-CM | POA: Diagnosis not present

## 2017-07-24 DIAGNOSIS — M5126 Other intervertebral disc displacement, lumbar region: Secondary | ICD-10-CM | POA: Diagnosis not present

## 2017-07-24 DIAGNOSIS — M545 Low back pain: Secondary | ICD-10-CM | POA: Diagnosis not present

## 2017-07-30 DIAGNOSIS — M5126 Other intervertebral disc displacement, lumbar region: Secondary | ICD-10-CM | POA: Diagnosis not present

## 2017-07-30 DIAGNOSIS — M545 Low back pain: Secondary | ICD-10-CM | POA: Diagnosis not present

## 2017-07-31 DIAGNOSIS — M1612 Unilateral primary osteoarthritis, left hip: Secondary | ICD-10-CM | POA: Diagnosis not present

## 2017-08-06 DIAGNOSIS — M5126 Other intervertebral disc displacement, lumbar region: Secondary | ICD-10-CM | POA: Diagnosis not present

## 2017-08-06 DIAGNOSIS — M545 Low back pain: Secondary | ICD-10-CM | POA: Diagnosis not present

## 2017-08-09 DIAGNOSIS — Z125 Encounter for screening for malignant neoplasm of prostate: Secondary | ICD-10-CM | POA: Diagnosis not present

## 2017-08-16 DIAGNOSIS — M545 Low back pain: Secondary | ICD-10-CM | POA: Diagnosis not present

## 2017-08-16 DIAGNOSIS — M5126 Other intervertebral disc displacement, lumbar region: Secondary | ICD-10-CM | POA: Diagnosis not present

## 2017-08-22 DIAGNOSIS — M5126 Other intervertebral disc displacement, lumbar region: Secondary | ICD-10-CM | POA: Diagnosis not present

## 2017-08-22 DIAGNOSIS — M545 Low back pain: Secondary | ICD-10-CM | POA: Diagnosis not present

## 2017-08-30 ENCOUNTER — Ambulatory Visit (INDEPENDENT_AMBULATORY_CARE_PROVIDER_SITE_OTHER): Payer: Medicare Other | Admitting: Family Medicine

## 2017-08-30 ENCOUNTER — Encounter: Payer: Self-pay | Admitting: Family Medicine

## 2017-08-30 VITALS — BP 112/68 | HR 58 | Temp 98.3°F | Ht 64.0 in | Wt 142.2 lb

## 2017-08-30 DIAGNOSIS — M549 Dorsalgia, unspecified: Secondary | ICD-10-CM | POA: Diagnosis not present

## 2017-08-30 NOTE — Progress Notes (Signed)
Musculoskeletal Exam  Patient: Ronnie Payne DOB: May 30, 1940  DOS: 08/30/2017  SUBJECTIVE:  Chief Complaint:   Chief Complaint  Patient presents with  . Back Pain    upper back    Ronnie Payne is a 77 y.o.  male for evaluation and treatment of upper back pain.   Onset:  2 weeks ago. No inj or change in activity.  Location: R upper back Character:  aching  Lasts for a few seconds.  Progression of issue:  is unchanged Associated symptoms: none Treatment: to date has been none.    ROS: Musculoskeletal/Extremities: +upper back pain  Past Medical History:  Diagnosis Date  . Frequent headaches   . History of skin cancer   . Hyperlipidemia   . Hypertension     Objective: VITAL SIGNS: BP 112/68 (BP Location: Left Arm, Patient Position: Sitting, Cuff Size: Normal)   Pulse (!) 58   Temp 98.3 F (36.8 C) (Oral)   Ht 5\' 4"  (1.626 m)   Wt 142 lb 4 oz (64.5 kg)   SpO2 97%   BMI 24.42 kg/m  Constitutional: Well formed, well developed. No acute distress. Cardiovascular: Brisk cap refill Thorax & Lungs: No accessory muscle use Musculoskeletal: Upper back .   Tenderness to palpation: yes- on R side over rhomboids and cephalad portion of erector spinae group Deformity: no Ecchymosis: no Neurologic: Normal sensory function. No focal deficits noted. DTR's equal and symmetry in UE's. Psychiatric: Normal mood. Age appropriate judgment and insight. Alert & oriented x 3.    Assessment:  Upper back pain  Plan: Stretches/exercises rec'd. Heat.  F/u prn. The patient voiced understanding and agreement to the plan.   [place], DO 08/30/17  11:00 AM

## 2017-08-30 NOTE — Patient Instructions (Addendum)
Mid-Back Strain Rehab It is normal to feel mild stretching, pulling, tightness, or discomfort as you do these exercises, but you should stop right away if you feel sudden pain or your pain gets worse.  Stretching and range of motion exercises This exercise warms up your muscles and joints and improves the movement and flexibility of your back and shoulders. This exercise also help to relieve pain. Exercise A: Chest and spine stretch  1. Lie down on your back on a firm surface. 2. Roll a towel or a small blanket so it is about 4 inches (10 cm) in diameter. 3. Put the towel lengthwise under the middle of your back so it is under your spine, but not under your shoulder blades. 4. To increase the stretch, you may put your hands behind your head and let your elbows fall to your sides. 5. Hold for 30 seconds. Repeat exercise 2 times. Complete this exercise 3 times per week. Strengthening exercises These exercises build strength and endurance in your back and your shoulder blade muscles. Endurance is the ability to use your muscles for a long time, even after they get tired. Exercise C: Straight arm rows ( shoulder extension) 1. Stand with your feet shoulder width apart. 2. Secure an exercise band to a stable object in front of you so the band is at or above shoulder height. 3. Hold one end of the exercise band in each hand. 4. Straighten your elbows and lift your hands up to shoulder height. 5. Step back, away from the secured end of the exercise band, until the band stretches. 6. Squeeze your shoulder blades together and pull your hands down to the sides of your thighs. Stop when your hands are straight down by your sides. Do not let your hands go behind your body. 7. Hold for 2 seconds. 8. Slowly return to the starting position. Repeat 2 times. Complete this exercise 3 times per week. Exercise D: Shoulder external rotation, prone 1. Lie on your abdomen on a firm bed so your left / right forearm  hangs over the edge of the bed and your upper arm is on the bed, straight out from your body. ? Your elbow should be bent. ? Your palm should be facing your feet. 2. If instructed, hold a 2-5 lb weight in your hand. 3. Squeeze your shoulder blade toward the middle of your back. Do not let your shoulder lift toward your ear. 4. Keep your elbow bent in an "L" shape (90 degrees) while you slowly move your forearm up toward the ceiling. Move your forearm up to the height of the bed, toward your head. ? Your upper arm should not move. ? At the top of the movement, your palm should face the floor. 5. Hold for 1 second. 6. Slowly return to the starting position and relax your muscles. Repeat 2 times. Complete this exercise 3 times per week. Exercise E: Scapular retraction and external rotation, rowing  1. Sit in a stable chair without armrests, or stand. 2. Secure an exercise band to a stable object in front of you so it is at shoulder height. 3. Hold one end of the exercise band in each hand. 4. Bring your arms out straight in front of you. 5. Step back, away from the secured end of the exercise band, until the band stretches. 6. Pull the band backward. As you do this, bend your elbows and squeeze your shoulder blades together, but avoid letting the rest of your body move.  Do not let your shoulders lift up toward your ears. 7. Stop when your elbows are at your sides or slightly behind your body. 8. Hold for 1 second1. 9. Slowly straighten your arms to return to the starting position. Repeat 2 times. Complete this exercise 3 times per week. Posture and body mechanics  Body mechanics refers to the movements and positions of your body while you do your daily activities. Posture is part of body mechanics. Good posture and healthy body mechanics can help to relieve stress in your body's tissues and joints. Good posture means that your spine is in its natural S-curve position (your spine is neutral),  your shoulders are pulled back slightly, and your head is not tipped forward. The following are general guidelines for applying improved posture and body mechanics to your everyday activities. Standing   When standing, keep your spine neutral and your feet about hip-width apart. Keep a slight bend in your knees. Your ears, shoulders, and hips should line up.  When you do a task in which you lean forward while standing in one place for a long time, place one foot up on a stable object that is 2-4 inches (5-10 cm) high, such as a footstool. This helps keep your spine neutral. Sitting   When sitting, keep your spine neutral and keep your feet flat on the floor. Use a footrest, if necessary, and keep your thighs parallel to the floor. Avoid rounding your shoulders, and avoid tilting your head forward.  When working at a desk or a computer, keep your desk at a height where your hands are slightly lower than your elbows. Slide your chair under your desk so you are close enough to maintain good posture.  When working at a computer, place your monitor at a height where you are looking straight ahead and you do not have to tilt your head forward or downward to look at the screen. Resting  When lying down and resting, avoid positions that are most painful for you.  If you have pain with activities such as sitting, bending, stooping, or squatting (flexion-based activities), lie in a position in which your body does not bend very much. For example, avoid curling up on your side with your arms and knees near your chest (fetal position).  If you have pain with activities such as standing for a long time or reaching with your arms (extension-based activities), lie with your spine in a neutral position and bend your knees slightly. Try the following positions:  Lying on your side with a pillow between your knees.  Lying on your back with a pillow under your knees.  Lifting   When lifting objects, keep  your feet at least shoulder-width apart and tighten your abdominal muscles.  Bend your knees and hips and keep your spine neutral. It is important to lift using the strength of your legs, not your back. Do not lock your knees straight out.  Always ask for help to lift heavy or awkward objects. Make sure you discuss any questions you have with your health care provider. Document Released: 04/23/2005 Document Revised: 12/29/2015 Document Reviewed: 02/02/2015 Elsevier Interactive Patient Education  [employer].

## 2017-08-30 NOTE — Progress Notes (Signed)
Pre visit review using our clinic review tool, if applicable. No additional management support is needed unless otherwise documented below in the visit note. 

## 2017-09-05 ENCOUNTER — Other Ambulatory Visit: Payer: Self-pay | Admitting: Orthopedic Surgery

## 2017-09-05 DIAGNOSIS — M1612 Unilateral primary osteoarthritis, left hip: Secondary | ICD-10-CM | POA: Diagnosis not present

## 2017-09-09 NOTE — Pre-Procedure Instructions (Signed)
Ronnie Payne  09/09/2017      [facility] [address] [place], [place] [address] Phone: [phone] Fax: [phone]    Your procedure is scheduled on Friday May 17.  Report to [facility] Admitting at 7:30 A.M.  Call this number if you have problems the morning of surgery:  [phone]   Remember:  Do not eat food or drink liquids after midnight.  Take these medicines the morning of surgery with A SIP OF WATER: Atenolol (tenormin)   7 days prior to surgery STOP taking any Aspirin(unless otherwise instructed by your surgeon), Aleve, Naproxen, Ibuprofen, Motrin, Advil, Goody's, BC's, all herbal medications, fish oil, and all vitamins    Do not wear jewelry, make-up or nail polish.  Do not wear lotions, powders, or perfumes, or deodorant.  Do not shave 48 hours prior to surgery.  Men may shave face and neck.  Do not bring valuables to the hospital.  [facility] is not responsible for any belongings or valuables.  Contacts, dentures or bridgework may not be worn into surgery.  Leave your suitcase in the car.  After surgery it may be brought to your room.  For patients admitted to the hospital, discharge time will be determined by your treatment team.  Patients discharged the day of surgery will not be allowed to drive home.   Special instructions:    [place]- Preparing For Surgery  Before surgery, you can play an important role. Because skin is not sterile, your skin needs to be as free of germs as possible. You can reduce the number of germs on your skin by washing with CHG (chlorahexidine gluconate) Soap before surgery.  CHG is an antiseptic cleaner which kills germs and bonds with the skin to continue killing germs even after washing.  Please do not use if you have an allergy to CHG or antibacterial soaps. If your skin becomes reddened/irritated stop using the CHG.  Do not shave (including  legs and underarms) for at least 48 hours prior to first CHG shower. It is OK to shave your face.  Please follow these instructions carefully.   1. Shower the NIGHT BEFORE SURGERY and the MORNING OF SURGERY with CHG.   2. If you chose to wash your hair, wash your hair first as usual with your normal shampoo.  3. After you shampoo, rinse your hair and body thoroughly to remove the shampoo.  4. Use CHG as you would any other liquid soap. You can apply CHG directly to the skin and wash gently with a scrungie or a clean washcloth.   5. Apply the CHG Soap to your body ONLY FROM THE NECK DOWN.  Do not use on open wounds or open sores. Avoid contact with your eyes, ears, mouth and genitals (private parts). Wash Face and genitals (private parts)  with your normal soap.  6. Wash thoroughly, paying special attention to the area where your surgery will be performed.  7. Thoroughly rinse your body with warm water from the neck down.  8. DO NOT shower/wash with your normal soap after using and rinsing off the CHG Soap.  9. Pat yourself dry with a CLEAN TOWEL.  10. Wear CLEAN PAJAMAS to bed the night before surgery, wear comfortable clothes the morning of surgery  11. Place CLEAN SHEETS on your bed the night of your first shower and DO NOT SLEEP WITH PETS.    Day of  Surgery: Do not apply any deodorants/lotions. Please wear clean clothes to the hospital/surgery center.      Please read over the following fact sheets that you were given. Coughing and Deep Breathing, Total Joint Packet, MRSA Information and Surgical Site Infection Prevention

## 2017-09-10 ENCOUNTER — Other Ambulatory Visit: Payer: Self-pay

## 2017-09-10 ENCOUNTER — Encounter (HOSPITAL_COMMUNITY): Payer: Self-pay

## 2017-09-10 ENCOUNTER — Encounter (HOSPITAL_COMMUNITY)
Admission: RE | Admit: 2017-09-10 | Discharge: 2017-09-10 | Disposition: A | Discharge status: Home / Self Care | Payer: Medicare Other | Source: Ambulatory Visit | Attending: Orthopedic Surgery | Admitting: Orthopedic Surgery

## 2017-09-10 ENCOUNTER — Ambulatory Visit (HOSPITAL_COMMUNITY)
Admission: RE | Admit: 2017-09-10 | Discharge: 2017-09-10 | Disposition: A | Discharge status: Home / Self Care | Payer: Medicare Other | Source: Ambulatory Visit | Attending: Orthopedic Surgery | Admitting: Orthopedic Surgery

## 2017-09-10 DIAGNOSIS — J449 Chronic obstructive pulmonary disease, unspecified: Secondary | ICD-10-CM | POA: Insufficient documentation

## 2017-09-10 DIAGNOSIS — Z01818 Encounter for other preprocedural examination: Secondary | ICD-10-CM

## 2017-09-10 DIAGNOSIS — Z01812 Encounter for preprocedural laboratory examination: Secondary | ICD-10-CM | POA: Insufficient documentation

## 2017-09-10 HISTORY — DX: Unspecified osteoarthritis, unspecified site: M19.90

## 2017-09-10 LAB — CBC WITH DIFFERENTIAL/PLATELET
Basophils Absolute: 0 10*3/uL (ref 0.0–0.1)
Basophils Relative: 1 %
EOS PCT: 1 %
Eosinophils Absolute: 0.1 10*3/uL (ref 0.0–0.7)
HCT: 46.1 % (ref 39.0–52.0)
Hemoglobin: 16 g/dL (ref 13.0–17.0)
LYMPHS ABS: 2.2 10*3/uL (ref 0.7–4.0)
LYMPHS PCT: 31 %
MCH: 31.8 pg (ref 26.0–34.0)
MCHC: 34.7 g/dL (ref 30.0–36.0)
MCV: 91.7 fL (ref 78.0–100.0)
Monocytes Absolute: 0.7 10*3/uL (ref 0.1–1.0)
Monocytes Relative: 10 %
Neutro Abs: 4.1 10*3/uL (ref 1.7–7.7)
Neutrophils Relative %: 57 %
PLATELETS: 173 10*3/uL (ref 150–400)
RBC: 5.03 MIL/uL (ref 4.22–5.81)
RDW: 13.6 % (ref 11.5–15.5)
WBC: 7.1 10*3/uL (ref 4.0–10.5)

## 2017-09-10 LAB — SURGICAL PCR SCREEN
MRSA, PCR: NEGATIVE
STAPHYLOCOCCUS AUREUS: NEGATIVE

## 2017-09-10 LAB — BASIC METABOLIC PANEL
Anion gap: 8 (ref 5–15)
BUN: 22 mg/dL — AB (ref 6–20)
CHLORIDE: 105 mmol/L (ref 101–111)
CO2: 28 mmol/L (ref 22–32)
Calcium: 9.6 mg/dL (ref 8.9–10.3)
Creatinine, Ser: 0.88 mg/dL (ref 0.61–1.24)
GFR calc Af Amer: >60 mL/min (ref >60)
GFR calc non Af Amer: >60 mL/min (ref >60)
Glucose, Bld: 96 mg/dL (ref 65–99)
POTASSIUM: 3.8 mmol/L (ref 3.5–5.1)
SODIUM: 141 mmol/L (ref 135–145)

## 2017-09-10 LAB — URINALYSIS, ROUTINE W REFLEX MICROSCOPIC
BILIRUBIN URINE: NEGATIVE
Glucose, UA: NEGATIVE mg/dL
Hgb urine dipstick: NEGATIVE
Ketones, ur: NEGATIVE mg/dL
Leukocytes, UA: NEGATIVE
NITRITE: NEGATIVE
PROTEIN: NEGATIVE mg/dL
Specific Gravity, Urine: 1.021 (ref 1.005–1.030)
pH: 5 (ref 5.0–8.0)

## 2017-09-10 LAB — TYPE AND SCREEN
ABO/RH(D): A POS
ANTIBODY SCREEN: NEGATIVE

## 2017-09-10 LAB — ABO/RH: ABO/RH(D): A POS

## 2017-09-10 LAB — PROTIME-INR
INR: 0.99
Prothrombin Time: 13 seconds (ref 11.4–15.2)

## 2017-09-10 LAB — APTT: aPTT: 32 seconds (ref 24–36)

## 2017-09-10 IMAGING — CR DG CHEST 2V
2 series · 2 of 2 positions shown · non-contrast
Comparison: None.

CLINICAL DATA: Preoperative evaluation for upcoming hip surgery,
initial encounter

EXAM:
CHEST - 2 VIEW

[w chest pa]
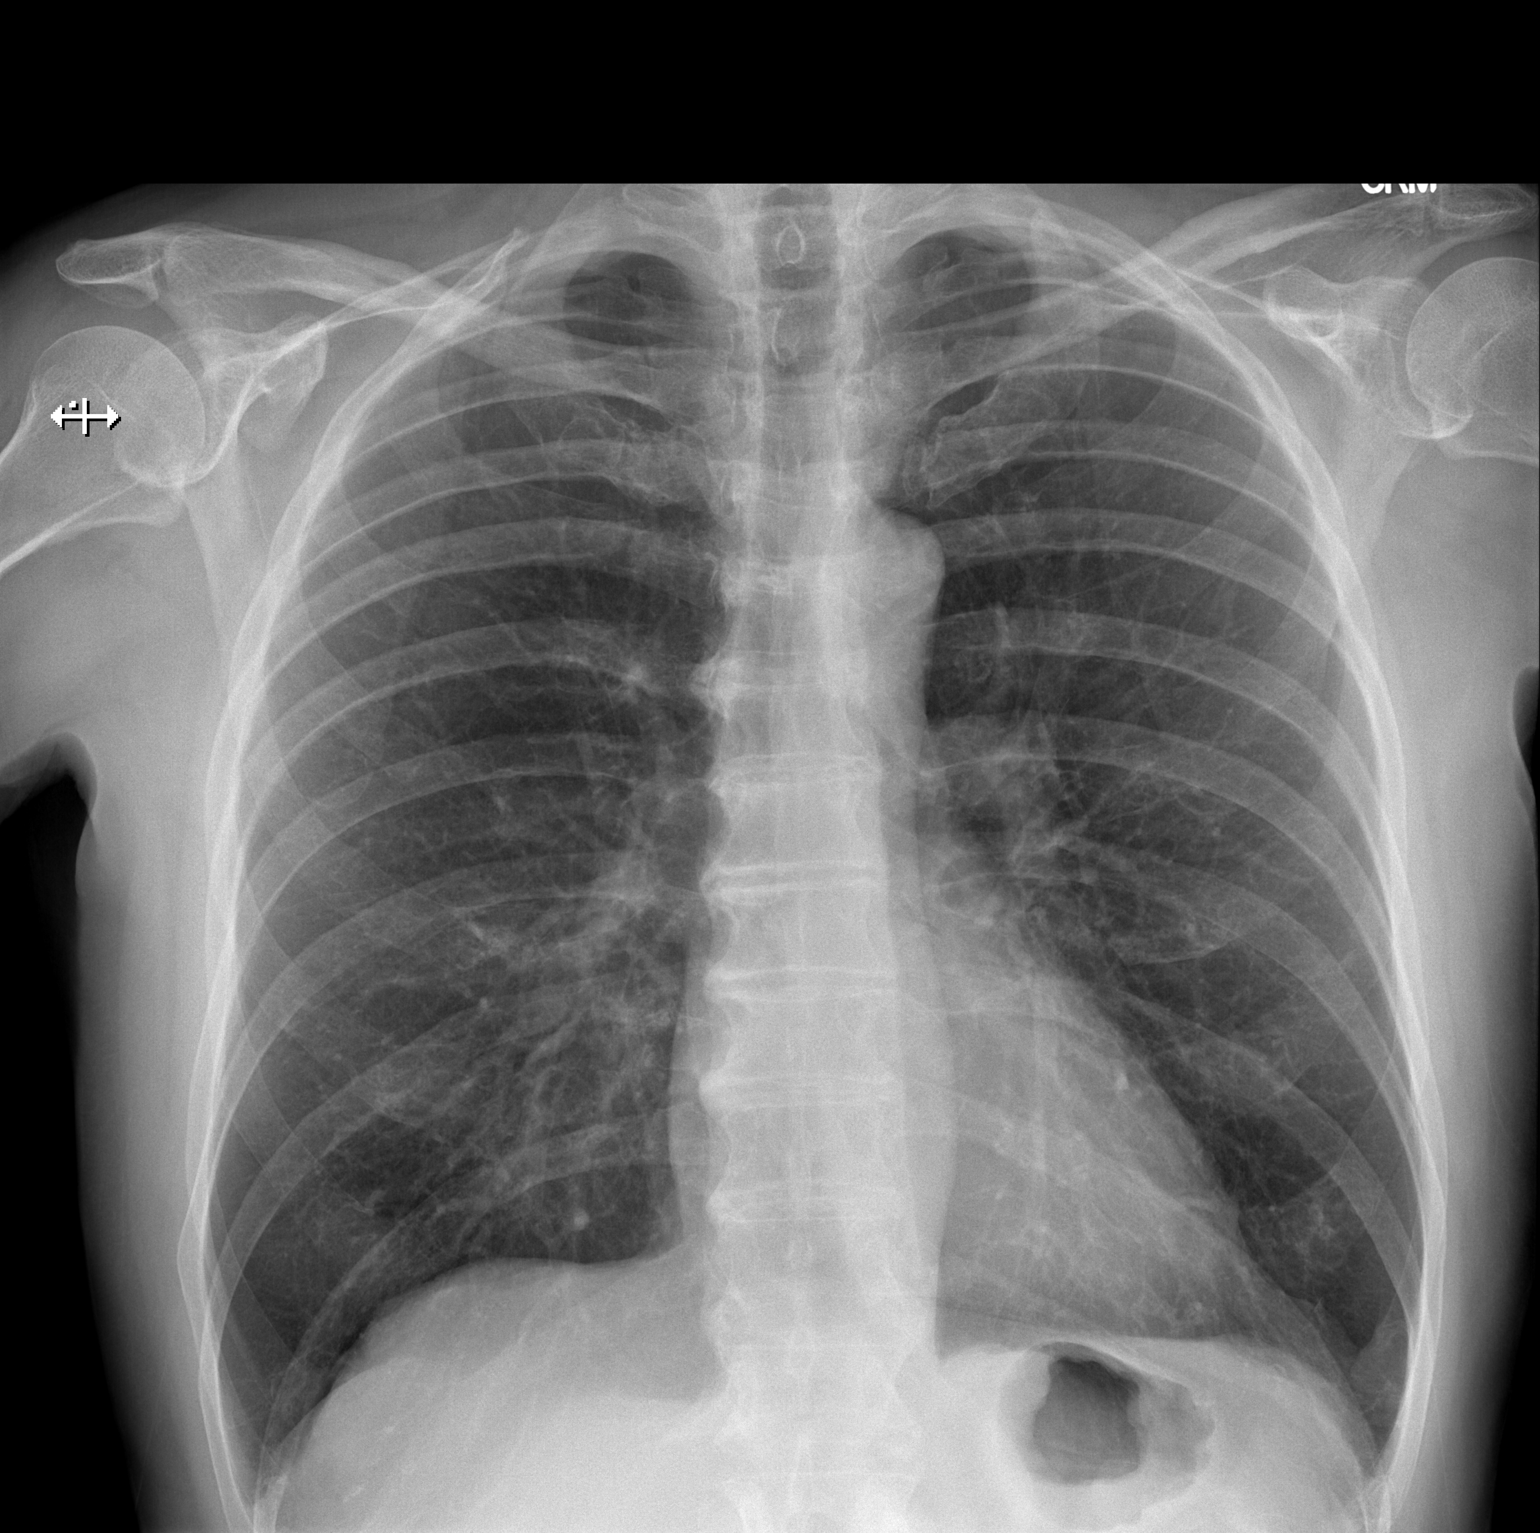

[w chest lat]
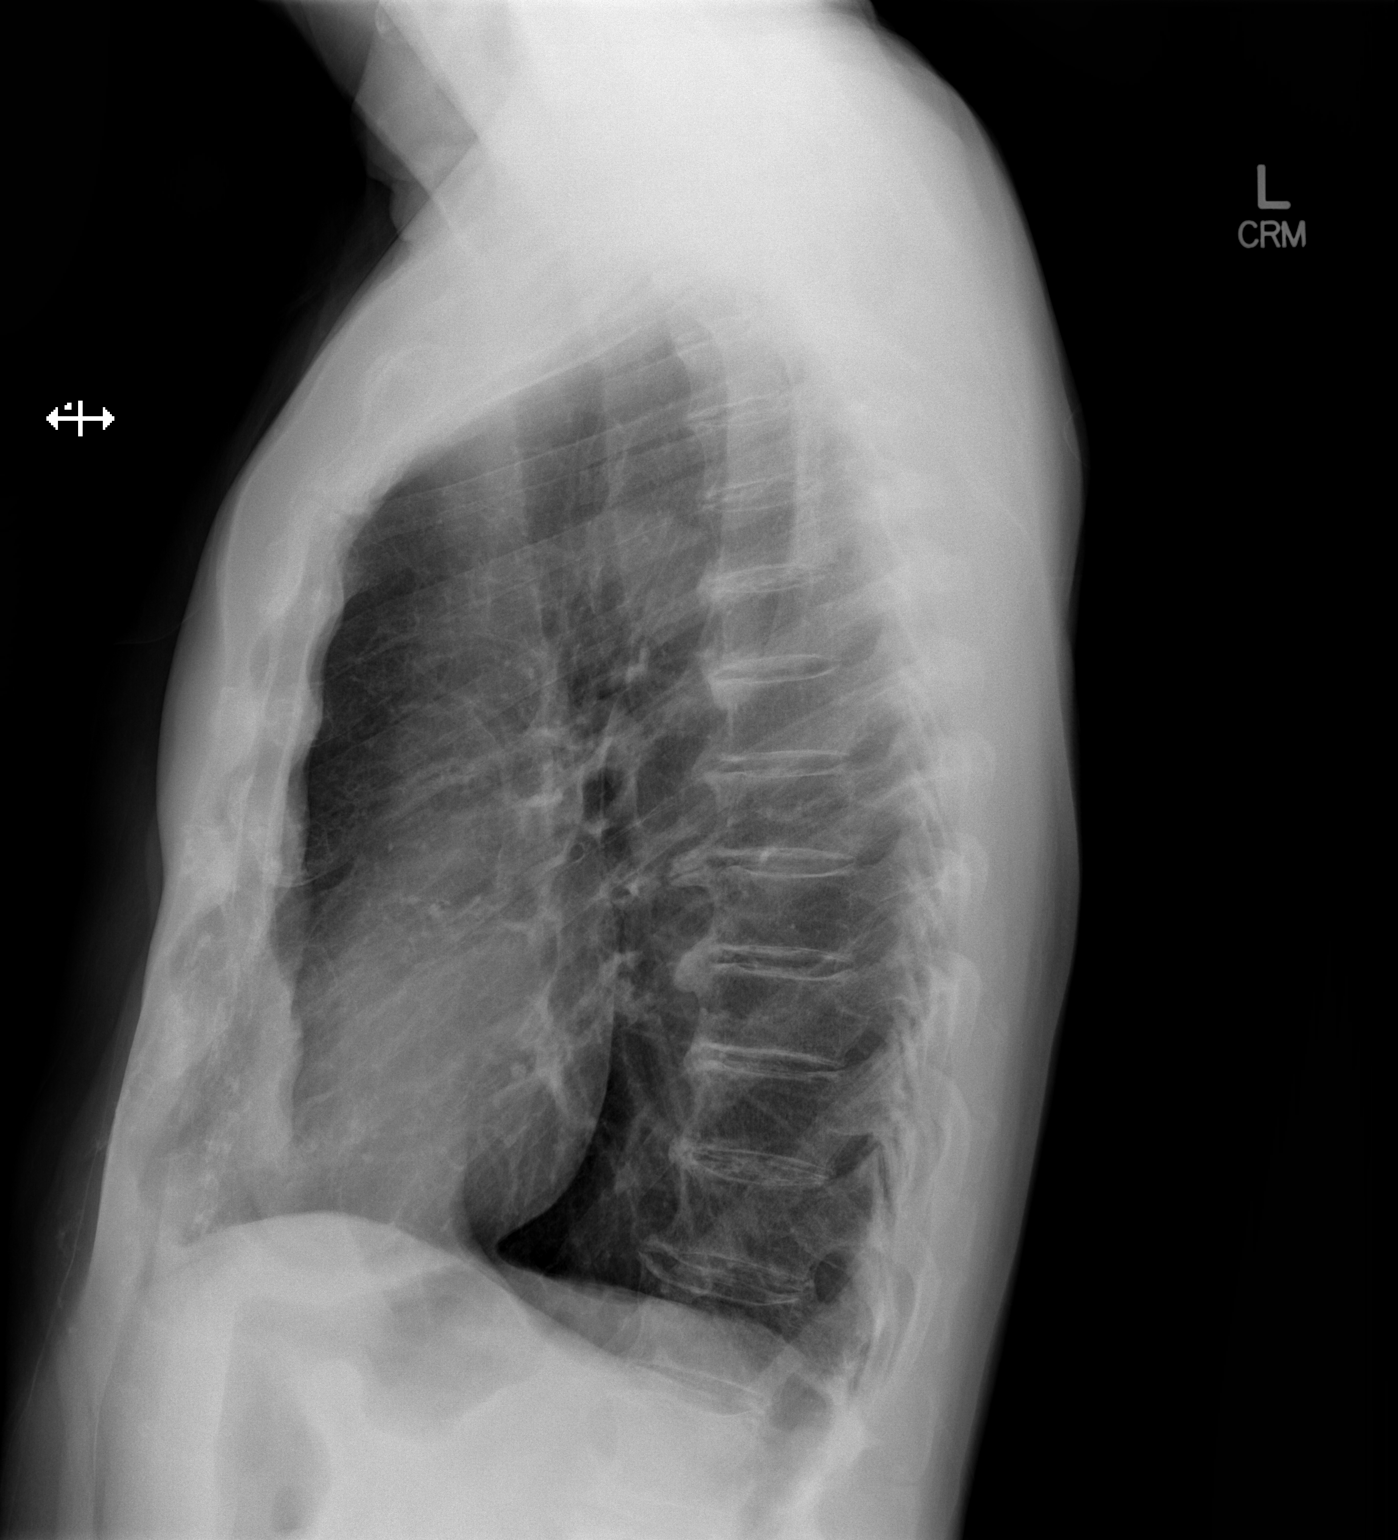

[2 of 2 positions shown; findings below may reference images not displayed]

FINDINGS: The heart size and mediastinal contours are within normal limits.
Both lungs are hyperinflated.. The visualized skeletal structures
are unremarkable.
IMPRESSION: COPD without acute abnormality.

## 2017-09-10 NOTE — Progress Notes (Signed)
PCP - Dr Nani Ravens Cardiologist - denies cardiologist  Chest x-ray - 09/10/17- pending EKG - 06/13/17- EKG was done when pt was having chest discomfort d/t taking metoprolol, pt has been switched back to Atenolol and has not had any problems since.     Patient denies shortness of breath, fever, cough and chest pain at PAT appointment   Patient verbalized understanding of instructions that were given to them at the PAT appointment. Patient was also instructed that they will need to review over the PAT instructions again at home before surgery.

## 2017-09-13 ENCOUNTER — Other Ambulatory Visit: Payer: Self-pay | Admitting: Orthopedic Surgery

## 2017-09-13 NOTE — Care Plan (Signed)
Spoke with patient prior to surgery. He plans to discharge to home with family and HHPT provided by [facility] at home. His equipment has been ordered prior to surgery for delivery to his room. Rolling walker and 3n1.  His follow up appointment with Dr Mayer Camel is set for 10/02/17 @ 1000 and he will transition to OPPT, if needed following that at 1200.   Please contact Ladell Heads, [place] with questions or if this plan needs to change.   Thanks

## 2017-09-17 DIAGNOSIS — R972 Elevated prostate specific antigen [PSA]: Secondary | ICD-10-CM | POA: Diagnosis not present

## 2017-09-17 DIAGNOSIS — Z125 Encounter for screening for malignant neoplasm of prostate: Secondary | ICD-10-CM | POA: Diagnosis not present

## 2017-09-17 DIAGNOSIS — M1612 Unilateral primary osteoarthritis, left hip: Secondary | ICD-10-CM | POA: Diagnosis present

## 2017-09-17 DIAGNOSIS — N5201 Erectile dysfunction due to arterial insufficiency: Secondary | ICD-10-CM | POA: Diagnosis not present

## 2017-09-17 DIAGNOSIS — R351 Nocturia: Secondary | ICD-10-CM | POA: Diagnosis not present

## 2017-09-17 NOTE — H&P (Signed)
TOTAL HIP ADMISSION H&P  Patient is admitted for left total hip arthroplasty.  Subjective:  Chief Complaint: left hip pain  HPI: Ronnie Payne, 77 y.o. male, has a history of pain and functional disability in the left hip(s) due to arthritis and patient has failed non-surgical conservative treatments for greater than 12 weeks to include NSAID's and/or analgesics, corticosteriod injections, weight reduction as appropriate and activity modification.  Onset of symptoms was gradual starting 2 years ago with gradually worsening course since that time.The patient noted no past surgery on the left hip(s).  Patient currently rates pain in the left hip at 10 out of 10 with activity. Patient has night pain, worsening of pain with activity and weight bearing, pain that interfers with activities of daily living and pain with passive range of motion. Patient has evidence of joint space narrowing by imaging studies. This condition presents safety issues increasing the risk of falls.   There is no current active infection.  There are no active problems to display for this patient.  Past Medical History:  Diagnosis Date  . Arthritis   . Frequent headaches   . History of skin cancer   . Hyperlipidemia   . Hypertension     Past Surgical History:  Procedure Laterality Date  . INGUINAL HERNIA REPAIR  10/06/2014  . TRANSURETHRAL RESECTION OF PROSTATE  2000-2012    No current facility-administered medications for this encounter.    Current Outpatient Medications  Medication Sig Dispense Refill Last Dose  . acetaminophen (TYLENOL) 500 MG tablet Take 1,000 mg by mouth every 6 (six) hours as needed for moderate pain or headache.     Marland Kitchen atenolol (TENORMIN) 50 MG tablet Take 1 tablet (50 mg total) by mouth daily. 90 tablet 1 Taking  . atorvastatin (LIPITOR) 20 MG tablet Take 20 mg by mouth every evening.    Taking  . cholecalciferol (VITAMIN D) 1000 units tablet Take 1,000 Units by mouth daily.   Taking  .  Multiple Vitamins-Minerals (OCUVITE PO) Take 1 tablet by mouth daily.     . multivitamin-iron-minerals-folic acid (CENTRUM) chewable tablet Chew 1 tablet by mouth daily.   Taking  . fluticasone (FLONASE) 50 MCG/ACT nasal spray Place into both nostrils daily.      No Known Allergies  Social History   Tobacco Use  . Smoking status: Never Smoker  . Smokeless tobacco: Never Used  Substance Use Topics  . Alcohol use: No    Family History  Problem Relation Age of Onset  . Thyroid cancer Mother   . Emphysema Father   . Lung cancer Sister   . Colon cancer Neg Hx   . Colon polyps Neg Hx   . Esophageal cancer Neg Hx   . Stomach cancer Neg Hx   . Rectal cancer Neg Hx      Review of Systems  Constitutional: Positive for diaphoresis.  HENT: Negative.   Eyes: Negative.   Respiratory: Negative.   Cardiovascular:       HTN  Gastrointestinal: Negative.   Genitourinary: Negative.   Musculoskeletal: Positive for joint pain.  Skin: Negative.   Neurological: Negative.   Endo/Heme/Allergies: Negative.   Psychiatric/Behavioral: Negative.     Objective:  Physical Exam  Constitutional: He is oriented to person, place, and time. He appears well-developed and well-nourished.  HENT:  Head: Normocephalic and atraumatic.  Eyes: Pupils are equal, round, and reactive to light.  Neck: Normal range of motion. Neck supple.  Cardiovascular: Intact distal pulses.  Respiratory: Effort  normal.  Musculoskeletal: He exhibits tenderness.  Patient walks with a left-sided limp.  Internal rotation blocks at 0.  External rotation is to 30, foot tap is negative.  Neurovascular intact.  Skin is intact.  Good range of motion of the knee.    Neurological: He is alert and oriented to person, place, and time.  Skin: Skin is warm and dry.  Psychiatric: He has a normal mood and affect. His behavior is normal. Judgment and thought content normal.    Vital signs in last 24 hours:    Labs:   Estimated body  mass index is 24.56 kg/m as calculated from the following:   Height as of 09/10/17: 5\' 4"  (1.626 m).   Weight as of 09/10/17: 64.9 kg (143 lb 1.6 oz).   Imaging Review Plain radiographs demonstrate bone-on-bone arthritic changes to the left hip, near bone-on-bone to the right hip.      Preoperative templating of the joint replacement has been completed, documented, and submitted to the Operating Room personnel in order to optimize intra-operative equipment management.    Assessment/Plan:  End stage arthritis, left hip(s)  The patient history, physical examination, clinical judgement of the provider and imaging studies are consistent with end stage degenerative joint disease of the left hip(s) and total hip arthroplasty is deemed medically necessary. The treatment options including medical management, injection therapy, arthroscopy and arthroplasty were discussed at length. The risks and benefits of total hip arthroplasty were presented and reviewed. The risks due to aseptic loosening, infection, stiffness, dislocation/subluxation,  thromboembolic complications and other imponderables were discussed.  The patient acknowledged the explanation, agreed to proceed with the plan and consent was signed. Patient is being admitted for inpatient treatment for surgery, pain control, PT, OT, prophylactic antibiotics, VTE prophylaxis, progressive ambulation and ADL's and discharge planning.The patient is planning to be discharged home with home health services.

## 2017-09-19 MED ORDER — BUPIVACAINE LIPOSOME 1.3 % IJ SUSP
20.0000 mL | Freq: Once | INTRAMUSCULAR | Status: AC
  Administered 2017-09-20: 20 mL
  Filled 2017-09-19: qty 20

## 2017-09-19 MED ORDER — CEFAZOLIN SODIUM-DEXTROSE 2-4 GM/100ML-% IV SOLN
2.0000 g | INTRAVENOUS | Status: AC
  Administered 2017-09-20: 2 g via INTRAVENOUS
  Filled 2017-09-19: qty 100

## 2017-09-19 MED ORDER — LACTATED RINGERS IV SOLN
INTRAVENOUS | Status: DC
  Administered 2017-09-20 (×2): via INTRAVENOUS

## 2017-09-19 MED ORDER — SODIUM CHLORIDE 0.9 % IV SOLN
2000.0000 mg | INTRAVENOUS | Status: AC
  Administered 2017-09-20: 2000 mg via TOPICAL
  Filled 2017-09-19: qty 20

## 2017-09-19 MED ORDER — SODIUM CHLORIDE 0.9 % IV SOLN
1000.0000 mg | INTRAVENOUS | Status: AC
  Administered 2017-09-20: 1000 mg via INTRAVENOUS
  Filled 2017-09-19: qty 1100

## 2017-09-20 ENCOUNTER — Inpatient Hospital Stay (HOSPITAL_COMMUNITY): Payer: Medicare Other | Admitting: Anesthesiology

## 2017-09-20 ENCOUNTER — Encounter (HOSPITAL_COMMUNITY): Payer: Self-pay | Admitting: General Practice

## 2017-09-20 ENCOUNTER — Inpatient Hospital Stay (HOSPITAL_COMMUNITY): Payer: Medicare Other

## 2017-09-20 ENCOUNTER — Inpatient Hospital Stay (HOSPITAL_COMMUNITY)
Admission: RE | Admit: 2017-09-20 | Discharge: 2017-09-21 | DRG: 470 | Disposition: A | Discharge status: Home Health | Payer: Medicare Other | Attending: Orthopedic Surgery | Admitting: Orthopedic Surgery

## 2017-09-20 ENCOUNTER — Other Ambulatory Visit: Payer: Self-pay

## 2017-09-20 ENCOUNTER — Encounter (HOSPITAL_COMMUNITY)
Admission: RE | Disposition: A | Discharge status: Home Health | Payer: Self-pay | Source: Home / Self Care | Attending: Orthopedic Surgery

## 2017-09-20 DIAGNOSIS — I10 Essential (primary) hypertension: Secondary | ICD-10-CM | POA: Diagnosis present

## 2017-09-20 DIAGNOSIS — D62 Acute posthemorrhagic anemia: Secondary | ICD-10-CM | POA: Diagnosis not present

## 2017-09-20 DIAGNOSIS — Z85828 Personal history of other malignant neoplasm of skin: Secondary | ICD-10-CM | POA: Diagnosis not present

## 2017-09-20 DIAGNOSIS — M1712 Unilateral primary osteoarthritis, left knee: Secondary | ICD-10-CM | POA: Diagnosis present

## 2017-09-20 DIAGNOSIS — Z419 Encounter for procedure for purposes other than remedying health state, unspecified: Secondary | ICD-10-CM

## 2017-09-20 DIAGNOSIS — Z9181 History of falling: Secondary | ICD-10-CM

## 2017-09-20 DIAGNOSIS — M25752 Osteophyte, left hip: Secondary | ICD-10-CM | POA: Diagnosis present

## 2017-09-20 DIAGNOSIS — Z9079 Acquired absence of other genital organ(s): Secondary | ICD-10-CM | POA: Diagnosis not present

## 2017-09-20 DIAGNOSIS — Z471 Aftercare following joint replacement surgery: Secondary | ICD-10-CM | POA: Diagnosis not present

## 2017-09-20 DIAGNOSIS — E785 Hyperlipidemia, unspecified: Secondary | ICD-10-CM | POA: Diagnosis present

## 2017-09-20 DIAGNOSIS — M1612 Unilateral primary osteoarthritis, left hip: Secondary | ICD-10-CM | POA: Diagnosis present

## 2017-09-20 DIAGNOSIS — Z96642 Presence of left artificial hip joint: Secondary | ICD-10-CM | POA: Diagnosis not present

## 2017-09-20 HISTORY — PX: TOTAL HIP ARTHROPLASTY: SHX124

## 2017-09-20 IMAGING — RF DG HIP (WITH PELVIS) OPERATIVE*L*
1 series · 2 of 2 positions shown · non-contrast
Comparison: None

FLUOROSCOPY TIME:  20 seconds

CLINICAL DATA: Total left hip arthroplasty

EXAM:
DG C-ARM 61-120 MIN; OPERATIVE LEFT HIP WITH PELVIS

[Series 1: run · 2 of 2 slices shown]
[im 1/2]
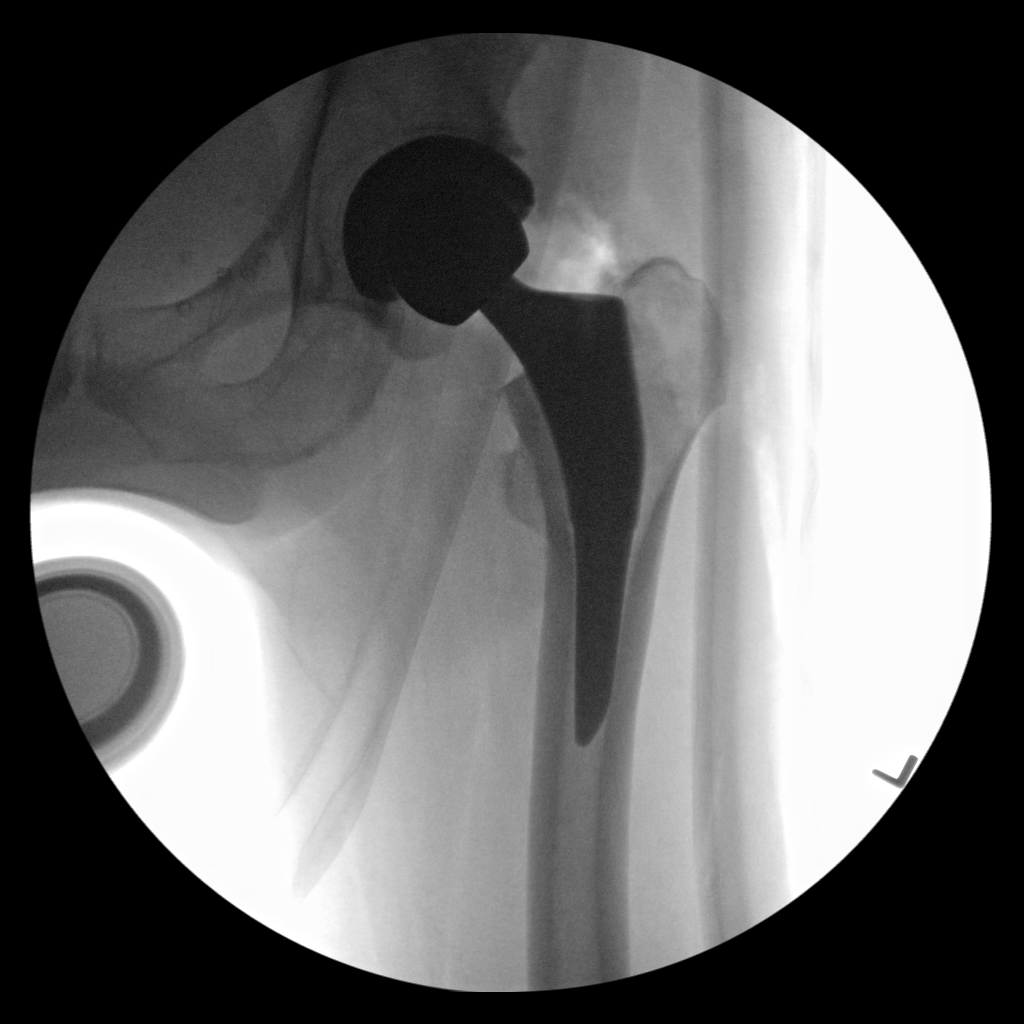
[im 2/2]
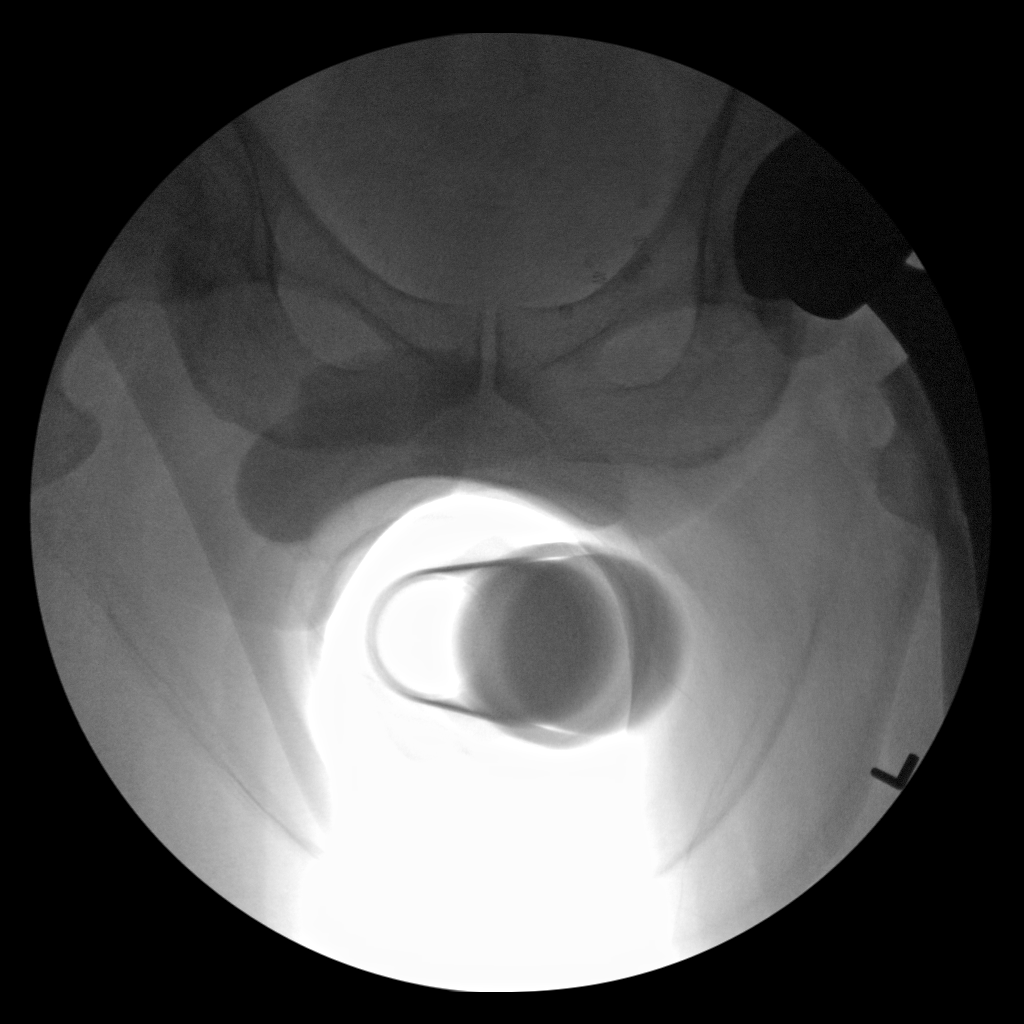

[2 of 2 positions shown; findings below may reference images not displayed]

FINDINGS: Left total hip arthroplasty without failure or complication. No
dislocation.
IMPRESSION: Left total hip arthroplasty.

## 2017-09-20 SURGERY — ARTHROPLASTY, HIP, TOTAL, ANTERIOR APPROACH
Anesthesia: Monitor Anesthesia Care | Site: Hip | Laterality: Left

## 2017-09-20 MED ORDER — PANTOPRAZOLE SODIUM 40 MG PO TBEC
40.0000 mg | DELAYED_RELEASE_TABLET | Freq: Every day | ORAL | Status: DC
  Administered 2017-09-20 – 2017-09-21 (×2): 40 mg via ORAL
  Filled 2017-09-20 (×2): qty 1

## 2017-09-20 MED ORDER — OXYCODONE HCL 5 MG PO TABS: 5.0000 mg | ORAL_TABLET | Freq: Once | ORAL | Status: DC | PRN

## 2017-09-20 MED ORDER — 0.9 % SODIUM CHLORIDE (POUR BTL) OPTIME
TOPICAL | Status: DC | PRN
  Administered 2017-09-20: 1000 mL

## 2017-09-20 MED ORDER — PHENOL 1.4 % MT LIQD: 1.0000 | OROMUCOSAL | Status: DC | PRN

## 2017-09-20 MED ORDER — FENTANYL CITRATE (PF) 100 MCG/2ML IJ SOLN
INTRAMUSCULAR | Status: DC | PRN
  Administered 2017-09-20: 25 ug via INTRAVENOUS
  Administered 2017-09-20: 50 ug via INTRAVENOUS

## 2017-09-20 MED ORDER — METOCLOPRAMIDE HCL 5 MG PO TABS: 5.0000 mg | ORAL_TABLET | Freq: Three times a day (TID) | ORAL | Status: DC | PRN

## 2017-09-20 MED ORDER — PROPOFOL 500 MG/50ML IV EMUL
INTRAVENOUS | Status: DC | PRN
  Administered 2017-09-20: 35 ug/kg/min via INTRAVENOUS

## 2017-09-20 MED ORDER — GABAPENTIN 300 MG PO CAPS
300.0000 mg | ORAL_CAPSULE | Freq: Three times a day (TID) | ORAL | Status: DC
  Administered 2017-09-20 (×2): 300 mg via ORAL
  Filled 2017-09-20 (×3): qty 1

## 2017-09-20 MED ORDER — OXYCODONE-ACETAMINOPHEN 5-325 MG PO TABS: 1.0000 | ORAL_TABLET | ORAL | 0 refills | Status: DC | PRN

## 2017-09-20 MED ORDER — ONDANSETRON HCL 4 MG/2ML IJ SOLN: 4.0000 mg | Freq: Four times a day (QID) | INTRAMUSCULAR | Status: DC | PRN

## 2017-09-20 MED ORDER — DEXAMETHASONE SODIUM PHOSPHATE 10 MG/ML IJ SOLN
10.0000 mg | Freq: Once | INTRAMUSCULAR | Status: AC
  Administered 2017-09-21: 10 mg via INTRAVENOUS
  Filled 2017-09-20: qty 1

## 2017-09-20 MED ORDER — FENTANYL CITRATE (PF) 250 MCG/5ML IJ SOLN
INTRAMUSCULAR | Status: AC
  Filled 2017-09-20: qty 5

## 2017-09-20 MED ORDER — HYDROMORPHONE HCL 2 MG/ML IJ SOLN: 0.5000 mg | INTRAMUSCULAR | Status: DC | PRN

## 2017-09-20 MED ORDER — MENTHOL 3 MG MT LOZG: 1.0000 | LOZENGE | OROMUCOSAL | Status: DC | PRN

## 2017-09-20 MED ORDER — CHLORHEXIDINE GLUCONATE 4 % EX LIQD: 60.0000 mL | Freq: Once | CUTANEOUS | Status: DC

## 2017-09-20 MED ORDER — METHOCARBAMOL 500 MG PO TABS
500.0000 mg | ORAL_TABLET | Freq: Four times a day (QID) | ORAL | Status: DC | PRN
  Administered 2017-09-20: 500 mg via ORAL
  Filled 2017-09-20 (×3): qty 1

## 2017-09-20 MED ORDER — PHENYLEPHRINE HCL 10 MG/ML IJ SOLN
INTRAMUSCULAR | Status: DC | PRN
  Administered 2017-09-20 (×3): 80 ug via INTRAVENOUS

## 2017-09-20 MED ORDER — OXYCODONE HCL 5 MG/5ML PO SOLN: 5.0000 mg | Freq: Once | ORAL | Status: DC | PRN

## 2017-09-20 MED ORDER — DOCUSATE SODIUM 100 MG PO CAPS
100.0000 mg | ORAL_CAPSULE | Freq: Two times a day (BID) | ORAL | Status: DC
  Administered 2017-09-20 – 2017-09-21 (×2): 100 mg via ORAL
  Filled 2017-09-20 (×2): qty 1

## 2017-09-20 MED ORDER — POLYETHYLENE GLYCOL 3350 17 G PO PACK: 17.0000 g | PACK | Freq: Every day | ORAL | Status: DC | PRN

## 2017-09-20 MED ORDER — TIZANIDINE HCL 2 MG PO TABS: 2.0000 mg | ORAL_TABLET | Freq: Four times a day (QID) | ORAL | 0 refills | Status: DC | PRN

## 2017-09-20 MED ORDER — TRANEXAMIC ACID 1000 MG/10ML IV SOLN
1000.0000 mg | Freq: Once | INTRAVENOUS | Status: AC
  Administered 2017-09-20: 1000 mg via INTRAVENOUS
  Filled 2017-09-20: qty 10

## 2017-09-20 MED ORDER — OXYCODONE HCL 5 MG PO TABS
5.0000 mg | ORAL_TABLET | ORAL | Status: DC | PRN
  Administered 2017-09-20 (×2): 10 mg via ORAL
  Filled 2017-09-20 (×3): qty 2

## 2017-09-20 MED ORDER — EPHEDRINE SULFATE 50 MG/ML IJ SOLN
INTRAMUSCULAR | Status: DC | PRN
  Administered 2017-09-20: 5 mg via INTRAVENOUS

## 2017-09-20 MED ORDER — HYDROMORPHONE HCL 2 MG/ML IJ SOLN: 0.2500 mg | INTRAMUSCULAR | Status: DC | PRN

## 2017-09-20 MED ORDER — LIDOCAINE HCL (CARDIAC) PF 100 MG/5ML IV SOSY
PREFILLED_SYRINGE | INTRAVENOUS | Status: DC | PRN
  Administered 2017-09-20 (×2): 25 mg via INTRATRACHEAL

## 2017-09-20 MED ORDER — ASPIRIN EC 81 MG PO TBEC
81.0000 mg | DELAYED_RELEASE_TABLET | Freq: Every day | ORAL | Status: DC
  Administered 2017-09-21: 81 mg via ORAL
  Filled 2017-09-20: qty 1

## 2017-09-20 MED ORDER — BUPIVACAINE-EPINEPHRINE 0.25% -1:200000 IJ SOLN
INTRAMUSCULAR | Status: DC | PRN
  Administered 2017-09-20: 50 mL

## 2017-09-20 MED ORDER — DIPHENHYDRAMINE HCL 12.5 MG/5ML PO ELIX: 12.5000 mg | ORAL_SOLUTION | ORAL | Status: DC | PRN

## 2017-09-20 MED ORDER — BUPIVACAINE-EPINEPHRINE 0.25% -1:200000 IJ SOLN
INTRAMUSCULAR | Status: AC
  Filled 2017-09-20: qty 1

## 2017-09-20 MED ORDER — FLEET ENEMA 7-19 GM/118ML RE ENEM: 1.0000 | ENEMA | Freq: Once | RECTAL | Status: DC | PRN

## 2017-09-20 MED ORDER — METHOCARBAMOL 1000 MG/10ML IJ SOLN
500.0000 mg | Freq: Four times a day (QID) | INTRAVENOUS | Status: DC | PRN
  Filled 2017-09-20: qty 5

## 2017-09-20 MED ORDER — KCL IN DEXTROSE-NACL 20-5-0.45 MEQ/L-%-% IV SOLN
INTRAVENOUS | Status: DC
  Administered 2017-09-20: 17:00:00 via INTRAVENOUS
  Filled 2017-09-20: qty 1000

## 2017-09-20 MED ORDER — ASPIRIN EC 81 MG PO TBEC: 81.0000 mg | DELAYED_RELEASE_TABLET | Freq: Two times a day (BID) | ORAL | 0 refills | Status: DC

## 2017-09-20 MED ORDER — PROMETHAZINE HCL 25 MG/ML IJ SOLN: 6.2500 mg | INTRAMUSCULAR | Status: DC | PRN

## 2017-09-20 MED ORDER — BUPIVACAINE IN DEXTROSE 0.75-8.25 % IT SOLN
INTRATHECAL | Status: DC | PRN
  Administered 2017-09-20: 2 mL via INTRATHECAL

## 2017-09-20 MED ORDER — PROPOFOL 1000 MG/100ML IV EMUL
INTRAVENOUS | Status: AC
  Filled 2017-09-20: qty 200

## 2017-09-20 MED ORDER — BISACODYL 5 MG PO TBEC: 5.0000 mg | DELAYED_RELEASE_TABLET | Freq: Every day | ORAL | Status: DC | PRN

## 2017-09-20 MED ORDER — METOCLOPRAMIDE HCL 5 MG/ML IJ SOLN: 5.0000 mg | Freq: Three times a day (TID) | INTRAMUSCULAR | Status: DC | PRN

## 2017-09-20 MED ORDER — ALUM & MAG HYDROXIDE-SIMETH 200-200-20 MG/5ML PO SUSP: 30.0000 mL | ORAL | Status: DC | PRN

## 2017-09-20 MED ORDER — ATENOLOL 50 MG PO TABS
50.0000 mg | ORAL_TABLET | Freq: Every day | ORAL | Status: DC
  Administered 2017-09-21: 50 mg via ORAL
  Filled 2017-09-20: qty 1

## 2017-09-20 MED ORDER — ACETAMINOPHEN 325 MG PO TABS
325.0000 mg | ORAL_TABLET | Freq: Four times a day (QID) | ORAL | Status: DC | PRN
  Administered 2017-09-20 – 2017-09-21 (×2): 650 mg via ORAL
  Filled 2017-09-20 (×3): qty 2

## 2017-09-20 MED ORDER — BUPIVACAINE IN DEXTROSE 0.75-8.25 % IT SOLN: INTRATHECAL | Status: DC | PRN

## 2017-09-20 MED ORDER — ONDANSETRON HCL 4 MG/2ML IJ SOLN
INTRAMUSCULAR | Status: DC | PRN
  Administered 2017-09-20: 4 mg via INTRAVENOUS

## 2017-09-20 MED ORDER — PROPOFOL 10 MG/ML IV BOLUS
INTRAVENOUS | Status: DC | PRN
  Administered 2017-09-20: 20 mg via INTRAVENOUS

## 2017-09-20 MED ORDER — FLUTICASONE PROPIONATE 50 MCG/ACT NA SUSP
1.0000 | Freq: Every day | NASAL | Status: DC
  Filled 2017-09-20: qty 16

## 2017-09-20 MED ORDER — ONDANSETRON HCL 4 MG PO TABS: 4.0000 mg | ORAL_TABLET | Freq: Four times a day (QID) | ORAL | Status: DC | PRN

## 2017-09-20 SURGICAL SUPPLY — 48 items
BAG DECANTER FOR FLEXI CONT (MISCELLANEOUS) ×3 IMPLANT
BLADE SAW SGTL 18X1.27X75 (BLADE) ×2 IMPLANT
BLADE SAW SGTL 18X1.27X75MM (BLADE) ×1
CAPT HIP TOTAL 2 ×3 IMPLANT
CLOSURE STERI-STRIP 1/2X4 (GAUZE/BANDAGES/DRESSINGS) ×1
CLSR STERI-STRIP ANTIMIC 1/2X4 (GAUZE/BANDAGES/DRESSINGS) ×2 IMPLANT
COVER PERINEAL POST (MISCELLANEOUS) ×3 IMPLANT
COVER SURGICAL LIGHT HANDLE (MISCELLANEOUS) ×3 IMPLANT
DRAPE C-ARM 42X72 X-RAY (DRAPES) ×3 IMPLANT
DRAPE STERI IOBAN 125X83 (DRAPES) ×3 IMPLANT
DRAPE U-SHAPE 47X51 STRL (DRAPES) ×6 IMPLANT
DRSG AQUACEL AG ADV 3.5X 6 (GAUZE/BANDAGES/DRESSINGS) ×3 IMPLANT
DRSG AQUACEL AG ADV 3.5X10 (GAUZE/BANDAGES/DRESSINGS) ×3 IMPLANT
DURAPREP 26ML APPLICATOR (WOUND CARE) ×3 IMPLANT
ELECT BLADE 4.0 EZ CLEAN MEGAD (MISCELLANEOUS) ×3
ELECT REM PT RETURN 9FT ADLT (ELECTROSURGICAL) ×3
ELECTRODE BLDE 4.0 EZ CLN MEGD (MISCELLANEOUS) ×1 IMPLANT
ELECTRODE REM PT RTRN 9FT ADLT (ELECTROSURGICAL) ×1 IMPLANT
FACESHIELD WRAPAROUND (MASK) ×6 IMPLANT
GLOVE BIO SURGEON STRL SZ7.5 (GLOVE) ×3 IMPLANT
GLOVE BIO SURGEON STRL SZ8.5 (GLOVE) ×3 IMPLANT
GLOVE BIOGEL PI IND STRL 8 (GLOVE) ×1 IMPLANT
GLOVE BIOGEL PI IND STRL 9 (GLOVE) ×1 IMPLANT
GLOVE BIOGEL PI INDICATOR 8 (GLOVE) ×2
GLOVE BIOGEL PI INDICATOR 9 (GLOVE) ×2
GOWN STRL REUS W/ TWL LRG LVL3 (GOWN DISPOSABLE) ×1 IMPLANT
GOWN STRL REUS W/ TWL XL LVL3 (GOWN DISPOSABLE) ×2 IMPLANT
GOWN STRL REUS W/TWL LRG LVL3 (GOWN DISPOSABLE) ×2
GOWN STRL REUS W/TWL XL LVL3 (GOWN DISPOSABLE) ×4
KIT BASIN OR (CUSTOM PROCEDURE TRAY) ×3 IMPLANT
KIT TURNOVER KIT B (KITS) ×3 IMPLANT
MANIFOLD NEPTUNE II (INSTRUMENTS) ×3 IMPLANT
NEEDLE HYPO 22GX1.5 SAFETY (NEEDLE) ×6 IMPLANT
NS IRRIG 1000ML POUR BTL (IV SOLUTION) ×3 IMPLANT
PACK TOTAL JOINT (CUSTOM PROCEDURE TRAY) ×3 IMPLANT
PAD ARMBOARD 7.5X6 YLW CONV (MISCELLANEOUS) ×6 IMPLANT
SUT ETHIBOND NAB CT1 #1 30IN (SUTURE) ×3 IMPLANT
SUT VIC AB 0 CT1 27 (SUTURE)
SUT VIC AB 0 CT1 27XBRD ANBCTR (SUTURE) IMPLANT
SUT VIC AB 1 CTX 36 (SUTURE) ×2
SUT VIC AB 1 CTX36XBRD ANBCTR (SUTURE) ×1 IMPLANT
SUT VIC AB 2-0 CT1 27 (SUTURE) ×2
SUT VIC AB 2-0 CT1 TAPERPNT 27 (SUTURE) ×1 IMPLANT
SUT VIC AB 3-0 CT1 27 (SUTURE) ×2
SUT VIC AB 3-0 CT1 TAPERPNT 27 (SUTURE) ×1 IMPLANT
SYR CONTROL 10ML LL (SYRINGE) ×6 IMPLANT
TOWEL OR 17X26 10 PK STRL BLUE (TOWEL DISPOSABLE) ×3 IMPLANT
TRAY CATH 16FR W/PLASTIC CATH (SET/KITS/TRAYS/PACK) IMPLANT

## 2017-09-20 NOTE — Progress Notes (Signed)
Orthopedic Tech Progress Note Patient Details:  Ronnie Payne 08/05/1940 [id number]  Ortho Devices Type of Ortho Device: Bone foam zero knee Ortho Device/Splint Location: LLE Ortho Device/Splint Interventions: Ordered, Application   Post Interventions Patient Tolerated: Well Instructions Provided: Care of device   Braulio Bosch 09/20/2017, 6:02 PM

## 2017-09-20 NOTE — Anesthesia Procedure Notes (Signed)
Spinal  Patient location during procedure: OR Start time: 09/20/2017 10:12 AM End time: 09/20/2017 10:17 AM Staffing Anesthesiologist: Lynda Rainwater, MD Performed: anesthesiologist  Preanesthetic Checklist Completed: patient identified, site marked, surgical consent, pre-op evaluation, timeout performed, IV checked, risks and benefits discussed and monitors and equipment checked Spinal Block Patient position: sitting Prep: Betadine Patient monitoring: heart rate, cardiac monitor, continuous pulse ox and blood pressure Approach: midline Location: L3-4 Injection technique: single-shot Needle Needle type: Quincke  Needle gauge: 22 G Needle length: 9 cm

## 2017-09-20 NOTE — Anesthesia Preprocedure Evaluation (Signed)
Anesthesia Evaluation  Patient identified by MRN, date of birth, ID band Patient awake    Reviewed: Allergy & Precautions, NPO status , Patient's Chart, lab work & pertinent test results  Airway Mallampati: II  TM Distance: >3 FB Neck ROM: Full    Dental no notable dental hx.    Pulmonary neg pulmonary ROS,    Pulmonary exam normal breath sounds clear to auscultation       Cardiovascular hypertension, Pt. on medications negative cardio ROS Normal cardiovascular exam Rhythm:Regular Rate:Normal     Neuro/Psych  Headaches, negative neurological ROS  negative psych ROS   GI/Hepatic negative GI ROS, Neg liver ROS,   Endo/Other  negative endocrine ROS  Renal/GU negative Renal ROS  negative genitourinary   Musculoskeletal negative musculoskeletal ROS (+) Arthritis , Osteoarthritis,    Abdominal   Peds negative pediatric ROS (+)  Hematology negative hematology ROS (+)   Anesthesia Other Findings   Reproductive/Obstetrics negative OB ROS                             Anesthesia Physical Anesthesia Plan  ASA: II  Anesthesia Plan: Spinal   Post-op Pain Management:    Induction: Intravenous  PONV Risk Score and Plan: 1 and Ondansetron  Airway Management Planned: Simple Face Mask  Additional Equipment:   Intra-op Plan:   Post-operative Plan:   Informed Consent: I have reviewed the patients History and Physical, chart, labs and discussed the procedure including the risks, benefits and alternatives for the proposed anesthesia with the patient or authorized representative who has indicated his/her understanding and acceptance.   Dental advisory given  Plan Discussed with: CRNA  Anesthesia Plan Comments:         Anesthesia Quick Evaluation

## 2017-09-20 NOTE — Op Note (Signed)
OPERATIVE REPORT    DATE OF PROCEDURE:  09/20/2017       PREOPERATIVE DIAGNOSIS:  LEFT HIP OSTEOARTHRITIS                                                          POSTOPERATIVE DIAGNOSIS:  LEFT HIP OSTEOARTHRITIS                                                           PROCEDURE: Anterior L total hip arthroplasty using a 52 mm DePuy Pinnacle  Cup, [employer], 0-degree polyethylene liner, a +1 36 mm ceramic head, a 52 Depuy Triloc stem   SURGEON: Kerin Salen    ASSISTANT:   Kerry Hough. [employer]  (present throughout entire procedure and necessary for timely completion of the procedure)   ANESTHESIA: Spinal BLOOD LOSS: 300cc FLUID REPLACEMENT: 1500cc crystalloid Antibiotic: 2gm ancef Tranexamic Acid: 1gm IV, 2gm Topical COMPLICATIONS: none    INDICATIONS FOR PROCEDURE: A 77 y.o. year-old With  LEFT HIP OSTEOARTHRITIS   for 3 years, x-rays show bone-on-bone arthritic changes, and osteophytes. Despite conservative measures with observation, anti-inflammatory medicine, narcotics, use of a cane, has severe unremitting pain and can ambulate only a few blocks before resting. Patient desires elective L total hip arthroplasty to decrease pain and increase function. The risks, benefits, and alternatives were discussed at length including but not limited to the risks of infection, bleeding, nerve injury, stiffness, blood clots, the need for revision surgery, cardiopulmonary complications, among others, and they were willing to proceed. Questions answered     PROCEDURE IN DETAIL: The patient was identified by armband,  received preoperative IV antibiotics in the holding area at [facility], taken to the operating room , appropriate anesthetic monitors  were attached and  anesthesia was induced with the patienton the gurney. The HANA boots were applied to the feet and he was then transferred to the HANA table with a peroneal post and support underneath the non-operative le,  which was locked in 5 lb traction. Theoperative lower extremity was then prepped and draped in the usual sterile fashion from just above the iliac crest to the knee. And a timeout procedure was performed. We then made a 10 cm incision along the interval at the leading edge of the tensor fascia lata of starting at 2 cm lateral to and 2 cm distal to the ASIS. Small bleeders in the skin and subcutaneous tissue identified and cauterized we dissected down to the fascia and made an incision in the fascia allowing [place] to elevate the fascia of the tensor muscle and exploited the interval between the rectus and the tensor fascia lata. A Hohmann retractor was then placed along the superior neck of the femur and a Cobra retractor along the inferior neck of the femur we teed the capsule starting out at the superior anterior aspect of the acetabulum going distally and made the T along the neck both leaflets of the T were tagged with #2 Ethibond suture. Cobra retractors were then placed along the inferior and superior neck allowing [place] to perform a standard neck cut and  removed the femoral head with a power corkscrew. We then placed a right angle Hohmann retractor along the anterior aspect of the acetabulum a spiked Cobra in the cotyloid notch and posteriorly a Muelller retractor. We then sequentially reamed up to a 52 mm basket reamer obtaining good coverage in all quadrants, verified by C-arm imaging. Under C-arm control with and hammered into place a 52 mm Pinnacle cup in 45 of abduction and 15 of anteversion. The cup seated nicely and required no supplemental screws. We then placed a central hole Eliminator and a 0 polyethylene liner. The foot was then externally rotated to 110, the HANA elevator was placed around the flare of the greater trochanter and the limb was extended and abducted delivering the proximal femur up into the wound. A medium Hohmann retractor was placed over the greater trochanter and a Mueller retractor  along the posterior femoral neck completing the exposure. We then performed releases superiorly and and inferiorly of the capsule going back to the pirformis fossa superiorly and to the lesser trochanter inferiorly. We then entered the proximal femur with the box cutting offset chisel followed by, a canal sounder, the chili pepper and broaching up to a 2 broach. This seated nicely and we reamed the calcar. A trial reduction was performed with a 1.5 mm 36 mm head.The limb lengths were excellent the hip was stable in 90 of external rotation. At this point the trial components removed and we hammered into place a # 2   Offset Tri-Lock stem with Gryption coating. A + 1.5 32 mm ceramic ball was then hammered into place the hip was reduced and final C-arm images obtained. The wound was thoroughly irrigated with normal saline solution. We repaired the ant capsule and the tensor fascia lot a with running 0 vicryl suture. the subcutaneous tissue was closed with 2-0 and 3-0 Vicryl suture followed by an Aquacil dressing. At this point the patient was awaken and transferred to hospital gurney without difficulty. The subcutaneous tissue with 0 and 2-0 undyed Vicryl suture and the skin with running  3-0 vicryl subcuticular suture. Aquacil dressing was applied. The patient was then unclamped, rolled supine, awaken extubated and taken to recovery room without difficulty in stable condition.   Kerin Salen 09/20/2017, 11:29 AM

## 2017-09-20 NOTE — Plan of Care (Signed)
  Problem: Education: Goal: Knowledge of General Education information will improve Outcome: Progressing   Problem: Clinical Measurements: Goal: Ability to maintain clinical measurements within normal limits will improve Outcome: Progressing   Problem: Clinical Measurements: Goal: Will remain free from infection Outcome: Progressing   Problem: Activity: Goal: Risk for activity intolerance will decrease Outcome: Progressing   Problem: Nutrition: Goal: Adequate nutrition will be maintained Outcome: Progressing   Problem: Pain Managment: Goal: General experience of comfort will improve Outcome: Progressing   Problem: Safety: Goal: Ability to remain free from injury will improve Outcome: Progressing

## 2017-09-20 NOTE — Anesthesia Postprocedure Evaluation (Signed)
Anesthesia Post Note  Patient: MARGUIS MATHIESON  Procedure(s) Performed: TOTAL HIP ARTHROPLASTY ANTERIOR APPROACH (Left Hip)     Patient location during evaluation: PACU Anesthesia Type: MAC and Spinal Level of consciousness: oriented and awake and alert Pain management: pain level controlled Vital Signs Assessment: post-procedure vital signs reviewed and stable Respiratory status: spontaneous breathing and respiratory function stable Cardiovascular status: blood pressure returned to baseline and stable Postop Assessment: no headache, no backache and no apparent nausea or vomiting Anesthetic complications: no    Last Vitals:  Vitals:   09/20/17 0752 09/20/17 1211  BP: (!) 133/55   Pulse: 62   Resp: 18   Temp: 36.5 C (!) 36.4 C  SpO2: 100%     Last Pain:  Vitals:   09/20/17 1230  TempSrc:   PainSc: 0-No pain                 Lynda Rainwater

## 2017-09-20 NOTE — Interval H&P Note (Signed)
History and Physical Interval Note:  09/20/2017 7:26 AM  Ronnie Payne  has presented today for surgery, with the diagnosis of LEFT HIP OSTEOARTHRITIS  The various methods of treatment have been discussed with the patient and family. After consideration of risks, benefits and other options for treatment, the patient has consented to  Procedure(s): TOTAL HIP ARTHROPLASTY ANTERIOR APPROACH (Left) as a surgical intervention .  The patient's history has been reviewed, patient examined, no change in status, stable for surgery.  I have reviewed the patient's chart and labs.  Questions were answered to the patient's satisfaction.     Kerin Salen

## 2017-09-20 NOTE — Transfer of Care (Signed)
Immediate Anesthesia Transfer of Care Note  Patient: Ronnie Payne  Procedure(s) Performed: TOTAL HIP ARTHROPLASTY ANTERIOR APPROACH (Left Hip)  Patient Location: PACU  Anesthesia Type:MAC and Spinal  Level of Consciousness: awake, alert , oriented and sedated  Airway & Oxygen Therapy: Patient Spontanous Breathing and Patient connected to nasal cannula oxygen  Post-op Assessment: Report given to RN and Post -op Vital signs reviewed and stable  Post vital signs: Reviewed and stable  Last Vitals:  Vitals Value Taken Time  BP 136/74 09/20/2017  1:27 PM  Temp    Pulse 57 09/20/2017  1:29 PM  Resp 17 09/20/2017  1:29 PM  SpO2 100 % 09/20/2017  1:29 PM  Vitals shown include unvalidated device data.  Last Pain:  Vitals:   09/20/17 1230  TempSrc:   PainSc: 0-No pain      Patients Stated Pain Goal: 2 (83/47/58 3074)  Complications: No apparent anesthesia complications

## 2017-09-20 NOTE — Anesthesia Procedure Notes (Signed)
Procedure Name: MAC Date/Time: 09/20/2017 10:15 AM Performed by: Scheryl Darter, CRNA Pre-anesthesia Checklist: Patient identified, Emergency Drugs available, Suction available, Patient being monitored and Timeout performed Patient Re-evaluated:Patient Re-evaluated prior to induction Oxygen Delivery Method: Nasal cannula Placement Confirmation: positive ETCO2

## 2017-09-20 NOTE — Discharge Instructions (Signed)

## 2017-09-21 LAB — BASIC METABOLIC PANEL
ANION GAP: 10 (ref 5–15)
BUN: 13 mg/dL (ref 6–20)
CALCIUM: 8.5 mg/dL — AB (ref 8.9–10.3)
CO2: 27 mmol/L (ref 22–32)
Chloride: 99 mmol/L — ABNORMAL LOW (ref 101–111)
Creatinine, Ser: 0.9 mg/dL (ref 0.61–1.24)
GFR calc Af Amer: >60 mL/min (ref >60)
Glucose, Bld: 130 mg/dL — ABNORMAL HIGH (ref 65–99)
Potassium: 4.1 mmol/L (ref 3.5–5.1)
SODIUM: 136 mmol/L (ref 135–145)

## 2017-09-21 LAB — CBC
HCT: 41.9 % (ref 39.0–52.0)
Hemoglobin: 14 g/dL (ref 13.0–17.0)
MCH: 30.6 pg (ref 26.0–34.0)
MCHC: 33.4 g/dL (ref 30.0–36.0)
MCV: 91.5 fL (ref 78.0–100.0)
PLATELETS: 153 10*3/uL (ref 150–400)
RBC: 4.58 MIL/uL (ref 4.22–5.81)
RDW: 13.2 % (ref 11.5–15.5)
WBC: 10 10*3/uL (ref 4.0–10.5)

## 2017-09-21 NOTE — Plan of Care (Signed)
  Problem: Nutrition: Goal: Adequate nutrition will be maintained Outcome: Progressing   Problem: Elimination: Goal: Will not experience complications related to bowel motility Outcome: Progressing   Problem: Pain Managment: Goal: General experience of comfort will improve Outcome: Progressing   Problem: Safety: Goal: Ability to remain free from injury will improve Outcome: Progressing   

## 2017-09-21 NOTE — Progress Notes (Addendum)
PATIENT ID: Ronnie Payne  MRN: [id number]  DOB/AGE:  [birth year] / 77 y.o.  1 Day Post-Op Procedure(s) (LRB): TOTAL HIP ARTHROPLASTY ANTERIOR APPROACH (Left)    PROGRESS NOTE Subjective: Patient is alert, oriented, no Nausea, no Vomiting, yes passing gas, . Taking PO well. Denies SOB, Chest or Calf Pain. Using Incentive Spirometer, PAS in place. Ambulate WBAT with pt getting up to go to the restroom, has not had formal therapy yet. Patient reports pain as  2-3/10  .    Objective: Vital signs in last 24 hours: Vitals:   09/20/17 1600 09/20/17 1632 09/20/17 2150 09/21/17 0557  BP:  138/78 (!) 125/54 129/60  Pulse:  70 71 75  Resp:  16    Temp: 97.8 F (36.6 C) 98.2 F (36.8 C) 98.9 F (37.2 C) 98.5 F (36.9 C)  TempSrc:  Oral Oral Oral  SpO2:  97% 98% 93%  Weight:      Height:          Intake/Output from previous day: I/O last 3 completed shifts: In: 1410 [I.V.:1200; IV Piggyback:210] Out: 550 [Urine:350; Blood:200]   Intake/Output this shift: No intake/output data recorded.   LABORATORY DATA: Recent Labs    09/21/17 0432  WBC 10.0  HGB 14.0  HCT 41.9  PLT 153  NA 136  K 4.1  CL 99*  CO2 27  BUN 13  CREATININE 0.90  GLUCOSE 130*  CALCIUM 8.5*    Examination: Neurologically intact Neurovascular intact Sensation intact distally Intact pulses distally Dorsiflexion/Plantar flexion intact Incision: dressing C/D/I and no drainage No cellulitis present Compartment soft} XR AP&Lat of hip shows well placed\fixed THA  Assessment:   1 Day Post-Op Procedure(s) (LRB): TOTAL HIP ARTHROPLASTY ANTERIOR APPROACH (Left) ADDITIONAL DIAGNOSIS:  Expected Acute Blood Loss Anemia, Hypertension  Plan: PT/OT WBAT, THA  DVT Prophylaxis: SCDx72 hrs, ASA 81 mg BID x 4 weeks  DISCHARGE PLAN: Home, when pt meets therapy goals, possibly later today  DISCHARGE NEEDS: HHPT, Walker and 3-in-1 comode seat

## 2017-09-21 NOTE — Evaluation (Signed)
Physical Therapy Evaluation Patient Details Name: Ronnie Payne MRN: [id number] DOB: 05-18-1940 Today's Date: 09/21/2017   History of Present Illness  Pt is a 77 y/o male s/p L THA, anterior approach. PMH including but not limited to HTN and HLD.  Clinical Impression  Pt presented supine in bed with HOB elevated, awake and willing to participate in therapy session. Prior to admission, pt reported that he was independent with functional mobility and ADLs. Pt enjoys exercising and playing tennis. Pt's spouse present throughout session as well. Pt able to perform bed mobility with supervision, transfers with supervision and ambulate (~200') in hallway with RW and min guard. Pt also successfully completed stair training this session with no difficulties. Pt with some nausea during session, required sitting rest break. Pt would continue to benefit from skilled physical therapy services at this time while admitted and after d/c to address the below listed limitations in order to improve overall safety and independence with functional mobility.     Follow Up Recommendations Home health PT    Equipment Recommendations  Rolling walker with 5" wheels    Recommendations for Other Services       Precautions / Restrictions Precautions Precautions: Fall Restrictions Weight Bearing Restrictions: Yes LLE Weight Bearing: Weight bearing as tolerated      Mobility  Bed Mobility Overal bed mobility: Needs Assistance Bed Mobility: Sit to Supine       Sit to supine: Supervision   General bed mobility comments: pt able to use R LE to assist L LE onto bed, supervision for safety  Transfers Overall transfer level: Needs assistance Equipment used: Rolling walker (2 wheeled) Transfers: Sit to/from Stand Sit to Stand: Supervision         General transfer comment: supervision for safety  Ambulation/Gait Ambulation/Gait assistance: Min guard Ambulation Distance (Feet): 200 Feet Assistive  device: Rolling walker (2 wheeled) Gait Pattern/deviations: Step-through pattern;Decreased step length - right;Decreased step length - left;Decreased stride length;Decreased weight shift to left Gait velocity: decreased Gait velocity interpretation: <1.31 ft/sec, indicative of household ambulator General Gait Details: mild instability but no overt LOB or need for physical assistance, min guard for safety; pt limited by nausea and required one sitting rest break  Stairs Stairs: Yes Stairs assistance: Min guard Stair Management: Two rails;Step to pattern;Forwards Number of Stairs: 2 General stair comments: cueing for technique, no issues or concerns  Wheelchair Mobility    Modified Rankin (Stroke Patients Only)       Balance Overall balance assessment: Needs assistance Sitting-balance support: Feet supported Sitting balance-Leahy Scale: Good     Standing balance support: During functional activity;No upper extremity supported Standing balance-Leahy Scale: Fair                               Pertinent Vitals/Pain Pain Assessment: 0-10 Pain Score: 4  Pain Location: L hip Pain Descriptors / Indicators: Aching;Sore Pain Intervention(s): Monitored during session;Repositioned    Home Living Family/patient expects to be discharged to:: Private residence Living Arrangements: Spouse/significant other Available Help at Discharge: Family;Available 24 hours/day Type of Home: House Home Access: Level entry     Home Layout: Two level;Able to live on main level with bedroom/bathroom Home Equipment: Shower seat      Prior Function Level of Independence: Independent         Comments: enjoys playing tennis and exercising     Hand Dominance        Extremity/Trunk Assessment  Upper Extremity Assessment Upper Extremity Assessment: Overall WFL for tasks assessed    Lower Extremity Assessment Lower Extremity Assessment: LLE deficits/detail LLE Deficits /  Details: decreased strength and AROM limitations secondary to post-op    Cervical / Trunk Assessment Cervical / Trunk Assessment: Normal  Communication   Communication: No difficulties  Cognition Arousal/Alertness: Awake/alert Behavior During Therapy: WFL for tasks assessed/performed Overall Cognitive Status: Within Functional Limits for tasks assessed                                        General Comments      Exercises Other Exercises Other Exercises: provided pt with Hip therex handout and demonstrated (ankle pumps, quad sets, hip abduction, heel slides)   Assessment/Plan    PT Assessment Patient needs continued PT services  PT Problem List Decreased strength;Decreased range of motion;Decreased mobility;Decreased balance;Decreased activity tolerance;Decreased coordination;Decreased knowledge of use of DME;Decreased safety awareness;Decreased knowledge of precautions;Pain       PT Treatment Interventions DME instruction;Gait training;Stair training;Functional mobility training;Therapeutic activities;Therapeutic exercise;Neuromuscular re-education;Balance training;Patient/family education    PT Goals (Current goals can be found in the Care Plan section)  Acute Rehab PT Goals Patient Stated Goal: return home PT Goal Formulation: With patient/family Time For Goal Achievement: 10/05/17 Potential to Achieve Goals: Good    Frequency 7X/week   Barriers to discharge        Co-evaluation               AM-PAC PT "6 Clicks" Daily Activity  Outcome Measure Difficulty turning over in bed (including adjusting bedclothes, sheets and blankets)?: None Difficulty moving from lying on back to sitting on the side of the bed? : None Difficulty sitting down on and standing up from a chair with arms (e.g., wheelchair, bedside commode, etc,.)?: Unable Help needed moving to and from a bed to chair (including a wheelchair)?: None Help needed walking in hospital room?:  A Little Help needed climbing 3-5 steps with a railing? : A Little 6 Click Score: 19    End of Session Equipment Utilized During Treatment: Gait belt Activity Tolerance: Patient tolerated treatment well Patient left: in bed;with call bell/phone within reach;with family/visitor present Nurse Communication: Mobility status PT Visit Diagnosis: Other abnormalities of gait and mobility (R26.89)    Time: 5597-4163 PT Time Calculation (min) (ACUTE ONLY): 31 min   Charges:   PT Evaluation $PT Eval Moderate Complexity: 1 Mod PT Treatments $Gait Training: 8-22 mins   PT G Codes:        [place], PT, DPT [place] 09/21/2017, 11:12 AM

## 2017-09-21 NOTE — Discharge Summary (Signed)
Patient ID: Ronnie Payne MRN: [id number] DOB/AGE: 06/18/1940 77 y.o.  Admit date: 09/20/2017 Discharge date: 09/21/2017  Admission Diagnoses:  Principal Problem:   Osteoarthritis of left hip Active Problems:   Primary osteoarthritis of left knee   Discharge Diagnoses:  Same  Past Medical History:  Diagnosis Date  . Arthritis   . Frequent headaches   . History of skin cancer   . Hyperlipidemia   . Hypertension     Surgeries: Procedure(s): TOTAL HIP ARTHROPLASTY ANTERIOR APPROACH on 09/20/2017   Consultants:   Discharged Condition: Improved  Hospital Course: Ronnie Payne is an 77 y.o. male who was admitted 09/20/2017 for operative treatment ofOsteoarthritis of left hip. Patient has severe unremitting pain that affects sleep, daily activities, and work/hobbies. After pre-op clearance the patient was taken to the operating room on 09/20/2017 and underwent  Procedure(s): TOTAL HIP ARTHROPLASTY ANTERIOR APPROACH.    Patient was given perioperative antibiotics:  Anti-infectives (From admission, onward)   Start     Dose/Rate Route Frequency Ordered Stop   09/20/17 0800  ceFAZolin (ANCEF) IVPB 2g/100 mL premix     2 g 200 mL/hr over 30 Minutes Intravenous To [facility] 09/19/17 1213 09/20/17 1018       Patient was given sequential compression devices, early ambulation, and chemoprophylaxis to prevent DVT.  Patient benefited maximally from hospital stay and there were no complications.    Recent vital signs:  Patient Vitals for the past 24 hrs:  BP Temp Temp src Pulse Resp SpO2  09/21/17 0557 129/60 98.5 F (36.9 C) Oral 75 - 93 %  09/20/17 2150 (!) 125/54 98.9 F (37.2 C) Oral 71 - 98 %  09/20/17 1632 138/78 98.2 F (36.8 C) Oral 70 16 97 %  09/20/17 1600 - 97.8 F (36.6 C) - - - -  09/20/17 1425 122/69 - - 61 14 97 %  09/20/17 1415 - - - 62 15 96 %  09/20/17 1410 126/65 - - (!) 59 12 97 %  09/20/17 1400 - (!) 97.5 F (36.4 C) - 62 13 97 %   09/20/17 1355 119/70 - - 63 19 99 %  09/20/17 1345 - - - (!) 58 17 99 %  09/20/17 1342 135/69 - - (!) 59 14 100 %  09/20/17 1330 - - - (!) 58 15 100 %  09/20/17 1327 136/74 - - 60 15 99 %  09/20/17 1315 - - - 61 13 98 %  09/20/17 1310 113/88 - - 65 17 98 %  09/20/17 1300 - - - 64 18 98 %  09/20/17 1255 132/73 - - 62 15 100 %  09/20/17 1245 - - - 68 15 100 %  09/20/17 1240 113/60 - - 62 15 100 %  09/20/17 1230 - - - 62 16 100 %  09/20/17 1225 (!) 104/58 - - (!) 56 11 100 %  09/20/17 1215 - - - (!) 59 11 96 %  09/20/17 1211 - (!) 97.5 F (36.4 C) - - - -  09/20/17 1210 (!) 98/51 - - 60 12 100 %     Recent laboratory studies:  Recent Labs    09/21/17 0432  WBC 10.0  HGB 14.0  HCT 41.9  PLT 153  NA 136  K 4.1  CL 99*  CO2 27  BUN 13  CREATININE 0.90  GLUCOSE 130*  CALCIUM 8.5*     Discharge Medications:   Allergies as of 09/21/2017   No Known Allergies  Medication List    STOP taking these medications   acetaminophen 500 MG tablet Commonly known as:  TYLENOL     TAKE these medications   aspirin EC 81 MG tablet Take 1 tablet (81 mg total) by mouth 2 (two) times daily.   atenolol 50 MG tablet Commonly known as:  TENORMIN Take 1 tablet (50 mg total) by mouth daily.   atorvastatin 20 MG tablet Commonly known as:  LIPITOR Take 20 mg by mouth every evening.   cholecalciferol 1000 units tablet Commonly known as:  VITAMIN D Take 1,000 Units by mouth daily.   fluticasone 50 MCG/ACT nasal spray Commonly known as:  FLONASE Place into both nostrils daily.   multivitamin-iron-minerals-folic acid chewable tablet Chew 1 tablet by mouth daily.   OCUVITE PO Take 1 tablet by mouth daily.   oxyCODONE-acetaminophen 5-325 MG tablet Commonly known as:  PERCOCET/ROXICET Take 1 tablet by mouth every 4 (four) hours as needed for severe pain.   tiZANidine 2 MG tablet Commonly known as:  ZANAFLEX Take 1 tablet (2 mg total) by mouth every 6 (six) hours as  needed.            Durable Medical Equipment  (From admission, onward)        Start     Ordered   09/20/17 1625  DME Walker rolling  Once    Question:  Patient needs a walker to treat with the following condition  Answer:  Status post total left knee replacement   09/20/17 1624   09/20/17 1625  DME 3 n 1  Once     09/20/17 1624       Discharge Care Instructions  (From admission, onward)        Start     Ordered   09/21/17 0000  Weight bearing as tolerated     09/21/17 1144      Diagnostic Studies: Dg Chest 2 View  Result Date: 09/10/2017 CLINICAL DATA:  Preoperative evaluation for upcoming hip surgery, initial encounter EXAM: CHEST - 2 VIEW COMPARISON:  None. FINDINGS: The heart size and mediastinal contours are within normal limits. Both lungs are hyperinflated. The visualized skeletal structures are unremarkable. IMPRESSION: COPD without acute abnormality. Electronically Signed   By: Inez Catalina M.D.   On: 09/10/2017 16:23   Dg C-arm 1-60 Min  Result Date: 09/20/2017 CLINICAL DATA:  Total left hip arthroplasty EXAM: DG C-ARM 61-120 MIN; OPERATIVE LEFT HIP WITH PELVIS COMPARISON:  None FLUOROSCOPY TIME:  20 seconds FINDINGS: Left total hip arthroplasty without failure or complication. No dislocation. IMPRESSION: Left total hip arthroplasty. Electronically Signed   By: Kathreen Devoid   On: 09/20/2017 11:48   Dg Hip Operative Unilat With Pelvis Left  Result Date: 09/20/2017 CLINICAL DATA:  Total left hip arthroplasty EXAM: DG C-ARM 61-120 MIN; OPERATIVE LEFT HIP WITH PELVIS COMPARISON:  None FLUOROSCOPY TIME:  20 seconds FINDINGS: Left total hip arthroplasty without failure or complication. No dislocation. IMPRESSION: Left total hip arthroplasty. Electronically Signed   By: Kathreen Devoid   On: 09/20/2017 11:48    Disposition: Discharge disposition: 01-Home or Self Care       Discharge Instructions    Call MD / Call 911   Complete by:  As directed    If you  experience chest pain or shortness of breath, CALL 911 and be transported to the hospital emergency room.  If you develope a fever above 101 F, pus (white drainage) or increased drainage or redness at the wound,  or calf pain, call your surgeon's office.   Constipation Prevention   Complete by:  As directed    Drink plenty of fluids.  Prune juice may be helpful.  You may use a stool softener, such as Colace (over the counter) 100 mg twice a day.  Use MiraLax (over the counter) for constipation as needed.   Diet - low sodium heart healthy   Complete by:  As directed    Driving restrictions   Complete by:  As directed    No driving for 2 weeks   Follow the hip precautions as taught in Physical Therapy   Complete by:  As directed    Increase activity slowly as tolerated   Complete by:  As directed    Patient may shower   Complete by:  As directed    You may shower without a dressing once there is no drainage.  Do not wash over the wound.  If drainage remains, cover wound with plastic wrap and then shower.   Weight bearing as tolerated   Complete by:  As directed       Follow-up Information    Frederik Pear, MD In 2 weeks.   Specialty:  Orthopedic Surgery Contact information: [place] [address] [phone]            Signed: Joanell Rising 09/21/2017, 11:45 AM

## 2017-09-21 NOTE — Care Management Note (Signed)
Case Management Note  Patient Details  Name: Ronnie Payne MRN: [id number] Date of Birth: 06/29/1940  Subjective/Objective:               Patient pre assigned to [facility] for [facility] [address], DME requested to be delivered to room prior to DC. RW and 3/1. No other CM needs.      Action/Plan:   Expected Discharge Date:  09/21/17               Expected Discharge Plan:  [place]  In-House Referral:     Discharge planning Services  CM Consult  Post Acute Care Choice:  Home Health, Durable Medical Equipment Choice offered to:  Patient  DME Arranged:  3-N-1, Walker rolling DME Agency:  [place]:  PT [place]:  [facility] ([facility]  Status of Service:  Completed, signed off  If discussed at [employer], dates discussed:    Additional Comments:  Carles Collet, RN 09/21/2017, 1:16 PM

## 2017-09-21 NOTE — Progress Notes (Signed)
Provided discharge education/instructions, all questions and concerns addressed, Pt discharged home with belongings accompanied by wife.

## 2017-09-21 NOTE — Progress Notes (Signed)
Physical Therapy Treatment Patient Details Name: Ronnie Payne MRN: [id number] DOB: [birth year] Today's Date: 09/21/2017    History of Present Illness Pt is a 77 y/o male s/p L THA, anterior approach. PMH including but not limited to HTN and HLD.    PT Comments    Pt making steady progress with functional mobility. Thorough review and participation in HEP for strengthening. Pt's wife present throughout as well. Plan is for pt to d/c home today. Pt is ready to d/c home from PT perspective. PT will continue to follow acutely.    Follow Up Recommendations  Home health PT     Equipment Recommendations  Rolling walker with 5" wheels    Recommendations for Other Services       Precautions / Restrictions Precautions Precautions: Fall Restrictions Weight Bearing Restrictions: Yes LLE Weight Bearing: Weight bearing as tolerated    Mobility  Bed Mobility               General bed mobility comments: pt sitting OOB in recliner chair  Transfers Overall transfer level: Needs assistance Equipment used: Rolling walker (2 wheeled) Transfers: Sit to/from Stand Sit to Stand: Supervision         General transfer comment: supervision for safety  Ambulation/Gait             General Gait Details: pt ambulated in AM session; focus of this session was on Therex   Stairs             Wheelchair Mobility    Modified Rankin (Stroke Patients Only)       Balance Overall balance assessment: Needs assistance Sitting-balance support: Feet supported Sitting balance-Leahy Scale: Good     Standing balance support: During functional activity;No upper extremity supported Standing balance-Leahy Scale: Fair                              Cognition Arousal/Alertness: Awake/alert Behavior During Therapy: WFL for tasks assessed/performed Overall Cognitive Status: Within Functional Limits for tasks assessed                                        Exercises Total Joint Exercises Gluteal Sets: AROM;Both;10 reps;Seated Heel Slides: AAROM;Left;10 reps;Seated Hip ABduction/ADduction: AAROM;Right;10 reps;Seated;Standing;Other (comment)(x10 seated with AAROM, x10 standing with no A) Long Arc Quad: AROM;Strengthening;Left;10 reps;Seated Knee Flexion: AROM;Strengthening;Left;10 reps;Standing Marching in Standing: AROM;Strengthening;Left;10 reps;Seated    General Comments        Pertinent Vitals/Pain Pain Assessment: No/denies pain    Home Living                      Prior Function            PT Goals (current goals can now be found in the care plan section) Acute Rehab PT Goals PT Goal Formulation: With patient/family Time For Goal Achievement: 10/05/17 Potential to Achieve Goals: Good Progress towards PT goals: Progressing toward goals    Frequency    7X/week      PT Plan Current plan remains appropriate    Co-evaluation              AM-PAC PT "6 Clicks" Daily Activity  Outcome Measure  Difficulty turning over in bed (including adjusting bedclothes, sheets and blankets)?: None Difficulty moving from lying on back to sitting on the side of the bed? :  None Difficulty sitting down on and standing up from a chair with arms (e.g., wheelchair, bedside commode, etc,.)?: None Help needed moving to and from a bed to chair (including a wheelchair)?: None Help needed walking in hospital room?: None Help needed climbing 3-5 steps with a railing? : A Little 6 Click Score: 23    End of Session   Activity Tolerance: Patient tolerated treatment well Patient left: in chair;with call bell/phone within reach;with family/visitor present Nurse Communication: Mobility status PT Visit Diagnosis: Other abnormalities of gait and mobility (R26.89)     Time: 7471-8550 PT Time Calculation (min) (ACUTE ONLY): 15 min  Charges:  $Therapeutic Exercise: 8-22 mins                    G Codes:       [place], PT, [place] 09/21/2017, 2:19 PM

## 2017-09-22 DIAGNOSIS — Z7982 Long term (current) use of aspirin: Secondary | ICD-10-CM | POA: Diagnosis not present

## 2017-09-22 DIAGNOSIS — E785 Hyperlipidemia, unspecified: Secondary | ICD-10-CM | POA: Diagnosis not present

## 2017-09-22 DIAGNOSIS — I10 Essential (primary) hypertension: Secondary | ICD-10-CM | POA: Diagnosis not present

## 2017-09-22 DIAGNOSIS — Z85828 Personal history of other malignant neoplasm of skin: Secondary | ICD-10-CM | POA: Diagnosis not present

## 2017-09-22 DIAGNOSIS — Z471 Aftercare following joint replacement surgery: Secondary | ICD-10-CM | POA: Diagnosis not present

## 2017-09-22 DIAGNOSIS — Z96642 Presence of left artificial hip joint: Secondary | ICD-10-CM | POA: Diagnosis not present

## 2017-09-23 ENCOUNTER — Telehealth: Payer: Self-pay

## 2017-09-23 NOTE — Telephone Encounter (Deleted)
Transition Care Management Follow-up Telephone Call  ADMISSION DATE:   DISCHARGE DATE:    How have you been since you were released from the hospital?   Do you understand why you were in the hospital?    Do you understand the discharge instrcutions?     Items Reviewed:  Medications reviewed:    Allergies reviewed:   Dietary changes reviewed:   Referrals reviewed:   Functional Questionnaire:   Activities of Daily Living (ADLs):   Any patient concerns?    Confirmed importance and date/time of follow-up visits scheduled:   Confirmed with patient if condition begins to worsen call PCP or go to the ER.    Patient was given the office number and encouragred to call back with questions or concerns.

## 2017-09-24 DIAGNOSIS — E785 Hyperlipidemia, unspecified: Secondary | ICD-10-CM | POA: Diagnosis not present

## 2017-09-24 DIAGNOSIS — Z96642 Presence of left artificial hip joint: Secondary | ICD-10-CM | POA: Diagnosis not present

## 2017-09-24 DIAGNOSIS — Z7982 Long term (current) use of aspirin: Secondary | ICD-10-CM | POA: Diagnosis not present

## 2017-09-24 DIAGNOSIS — Z471 Aftercare following joint replacement surgery: Secondary | ICD-10-CM | POA: Diagnosis not present

## 2017-09-24 DIAGNOSIS — Z85828 Personal history of other malignant neoplasm of skin: Secondary | ICD-10-CM | POA: Diagnosis not present

## 2017-09-24 DIAGNOSIS — I10 Essential (primary) hypertension: Secondary | ICD-10-CM | POA: Diagnosis not present

## 2017-09-25 ENCOUNTER — Encounter (HOSPITAL_COMMUNITY): Payer: Self-pay | Admitting: Orthopedic Surgery

## 2017-09-26 ENCOUNTER — Encounter: Payer: Self-pay | Admitting: Family Medicine

## 2017-09-26 DIAGNOSIS — Z85828 Personal history of other malignant neoplasm of skin: Secondary | ICD-10-CM | POA: Diagnosis not present

## 2017-09-26 DIAGNOSIS — E785 Hyperlipidemia, unspecified: Secondary | ICD-10-CM | POA: Diagnosis not present

## 2017-09-26 DIAGNOSIS — Z7982 Long term (current) use of aspirin: Secondary | ICD-10-CM | POA: Diagnosis not present

## 2017-09-26 DIAGNOSIS — Z96642 Presence of left artificial hip joint: Secondary | ICD-10-CM | POA: Diagnosis not present

## 2017-09-26 DIAGNOSIS — Z471 Aftercare following joint replacement surgery: Secondary | ICD-10-CM | POA: Diagnosis not present

## 2017-09-26 DIAGNOSIS — I10 Essential (primary) hypertension: Secondary | ICD-10-CM | POA: Diagnosis not present

## 2017-09-30 DIAGNOSIS — Z7982 Long term (current) use of aspirin: Secondary | ICD-10-CM | POA: Diagnosis not present

## 2017-09-30 DIAGNOSIS — E785 Hyperlipidemia, unspecified: Secondary | ICD-10-CM | POA: Diagnosis not present

## 2017-09-30 DIAGNOSIS — Z471 Aftercare following joint replacement surgery: Secondary | ICD-10-CM | POA: Diagnosis not present

## 2017-09-30 DIAGNOSIS — I10 Essential (primary) hypertension: Secondary | ICD-10-CM | POA: Diagnosis not present

## 2017-09-30 DIAGNOSIS — Z85828 Personal history of other malignant neoplasm of skin: Secondary | ICD-10-CM | POA: Diagnosis not present

## 2017-09-30 DIAGNOSIS — Z96642 Presence of left artificial hip joint: Secondary | ICD-10-CM | POA: Diagnosis not present

## 2017-10-02 DIAGNOSIS — M1612 Unilateral primary osteoarthritis, left hip: Secondary | ICD-10-CM | POA: Diagnosis not present

## 2017-10-02 DIAGNOSIS — M79652 Pain in left thigh: Secondary | ICD-10-CM | POA: Diagnosis not present

## 2017-10-02 DIAGNOSIS — Z96642 Presence of left artificial hip joint: Secondary | ICD-10-CM | POA: Diagnosis not present

## 2017-10-02 DIAGNOSIS — Z471 Aftercare following joint replacement surgery: Secondary | ICD-10-CM | POA: Diagnosis not present

## 2017-10-09 DIAGNOSIS — M79652 Pain in left thigh: Secondary | ICD-10-CM | POA: Diagnosis not present

## 2017-10-09 DIAGNOSIS — Z96642 Presence of left artificial hip joint: Secondary | ICD-10-CM | POA: Diagnosis not present

## 2017-10-14 DIAGNOSIS — M79652 Pain in left thigh: Secondary | ICD-10-CM | POA: Diagnosis not present

## 2017-10-14 DIAGNOSIS — Z96642 Presence of left artificial hip joint: Secondary | ICD-10-CM | POA: Diagnosis not present

## 2017-10-21 DIAGNOSIS — M79652 Pain in left thigh: Secondary | ICD-10-CM | POA: Diagnosis not present

## 2017-10-21 DIAGNOSIS — Z96642 Presence of left artificial hip joint: Secondary | ICD-10-CM | POA: Diagnosis not present

## 2017-10-23 DIAGNOSIS — M79652 Pain in left thigh: Secondary | ICD-10-CM | POA: Diagnosis not present

## 2017-10-23 DIAGNOSIS — Z96642 Presence of left artificial hip joint: Secondary | ICD-10-CM | POA: Diagnosis not present

## 2017-10-24 ENCOUNTER — Encounter: Payer: Self-pay | Admitting: Family Medicine

## 2017-10-25 ENCOUNTER — Encounter: Payer: Self-pay | Admitting: Family Medicine

## 2017-10-25 ENCOUNTER — Ambulatory Visit (INDEPENDENT_AMBULATORY_CARE_PROVIDER_SITE_OTHER): Payer: Medicare Other | Admitting: Family Medicine

## 2017-10-25 VITALS — BP 120/62 | HR 64 | Temp 97.7°F | Ht 64.0 in | Wt 147.0 lb

## 2017-10-25 DIAGNOSIS — M549 Dorsalgia, unspecified: Secondary | ICD-10-CM | POA: Diagnosis not present

## 2017-10-25 DIAGNOSIS — R7303 Prediabetes: Secondary | ICD-10-CM | POA: Diagnosis not present

## 2017-10-25 DIAGNOSIS — I10 Essential (primary) hypertension: Secondary | ICD-10-CM

## 2017-10-25 LAB — HEMOGLOBIN A1C: HEMOGLOBIN A1C: 5.3 % (ref 4.6–6.5)

## 2017-10-25 MED ORDER — CYCLOBENZAPRINE HCL 5 MG PO TABS: 5.0000 mg | ORAL_TABLET | Freq: Two times a day (BID) | ORAL | 0 refills | Status: DC | PRN

## 2017-10-25 NOTE — Progress Notes (Signed)
Musculoskeletal Exam  Patient: Ronnie Payne DOB: 05-Dec-1940  DOS: 10/25/2017  SUBJECTIVE:  Chief Complaint:   Chief Complaint  Patient presents with  . Pain    Ronnie Payne is a 77 y.o.  male for evaluation and treatment of upper back pain.   Onset:  3 days ago.  No inj or change in activity. Had something similar a few months ago that had spontaneously resolved with supportive care. Location: R upper back Character:  aching  Progression of issue:  is unchanged Associated symptoms: none Treatment: to date has been: Zanaflex.   Neurovascular symptoms: no  ROS: Musculoskeletal/Extremities: +Upper back pain  Past Medical History:  Diagnosis Date  . Arthritis   . Frequent headaches   . History of skin cancer   . Hyperlipidemia   . Hypertension   . Prediabetes     Objective: VITAL SIGNS: BP 120/62 (BP Location: Left Arm, Patient Position: Sitting, Cuff Size: Normal)   Pulse 64   Temp 97.7 F (36.5 C) (Oral)   Ht 5\' 4"  (1.626 m)   Wt 147 lb (66.7 kg)   SpO2 96%   BMI 25.23 kg/m  Constitutional: Well formed, well developed. No acute distress. Thorax & Lungs: No accessory muscle use Musculoskeletal: Upper back.   Tenderness to palpation: Yes over prox ES group on R Deformity: no Ecchymosis: no Neurologic: Normal sensory function. No focal deficits noted.  Psychiatric: Normal mood. Age appropriate judgment and insight. Alert & oriented x 3.    Assessment:  Upper back pain - Plan: cyclobenzaprine (FLEXERIL) 5 MG tablet  Essential hypertension  Prediabetes - Plan: Hemoglobin A1c  Plan: Low-dose of Flexeril.  Patient reports he is doing well with his medicine in the past.  Heat, ice, Tylenol.  If no improvement will refer to physical therapy. F/u prn. The patient voiced understanding and agreement to the plan.   [place], DO 10/25/17  12:04 PM

## 2017-10-25 NOTE — Patient Instructions (Signed)
Stretch the area. See if you can get someone to massage the area.  OK to take Tylenol 1000 mg (2 extra strength tabs) or 975 mg (3 regular strength tabs) every 6 hours as needed.  Heat (pad or rice pillow in microwave) over affected area, 10-15 minutes twice daily.   Let us know if you need anything.

## 2017-10-28 DIAGNOSIS — Z96642 Presence of left artificial hip joint: Secondary | ICD-10-CM | POA: Diagnosis not present

## 2017-10-28 DIAGNOSIS — M79652 Pain in left thigh: Secondary | ICD-10-CM | POA: Diagnosis not present

## 2017-10-30 DIAGNOSIS — Z9889 Other specified postprocedural states: Secondary | ICD-10-CM | POA: Diagnosis not present

## 2017-10-30 DIAGNOSIS — Z96642 Presence of left artificial hip joint: Secondary | ICD-10-CM | POA: Diagnosis not present

## 2017-10-30 DIAGNOSIS — M25552 Pain in left hip: Secondary | ICD-10-CM | POA: Diagnosis not present

## 2017-11-04 DIAGNOSIS — M79652 Pain in left thigh: Secondary | ICD-10-CM | POA: Diagnosis not present

## 2017-11-04 DIAGNOSIS — Z96642 Presence of left artificial hip joint: Secondary | ICD-10-CM | POA: Diagnosis not present

## 2017-11-08 DIAGNOSIS — Z96642 Presence of left artificial hip joint: Secondary | ICD-10-CM | POA: Diagnosis not present

## 2017-11-08 DIAGNOSIS — M79652 Pain in left thigh: Secondary | ICD-10-CM | POA: Diagnosis not present

## 2017-11-12 DIAGNOSIS — Z96642 Presence of left artificial hip joint: Secondary | ICD-10-CM | POA: Diagnosis not present

## 2017-11-12 DIAGNOSIS — M79652 Pain in left thigh: Secondary | ICD-10-CM | POA: Diagnosis not present

## 2017-11-19 ENCOUNTER — Encounter: Payer: Self-pay | Admitting: Family Medicine

## 2017-11-20 ENCOUNTER — Encounter: Payer: Self-pay | Admitting: Family Medicine

## 2017-11-20 ENCOUNTER — Ambulatory Visit (INDEPENDENT_AMBULATORY_CARE_PROVIDER_SITE_OTHER): Payer: Medicare Other | Admitting: Family Medicine

## 2017-11-20 VITALS — BP 104/62 | HR 66 | Temp 97.9°F | Ht 64.0 in | Wt 145.4 lb

## 2017-11-20 DIAGNOSIS — B001 Herpesviral vesicular dermatitis: Secondary | ICD-10-CM | POA: Diagnosis not present

## 2017-11-20 DIAGNOSIS — M545 Low back pain, unspecified: Secondary | ICD-10-CM

## 2017-11-20 MED ORDER — TIZANIDINE HCL 2 MG PO TABS: 2.0000 mg | ORAL_TABLET | Freq: Three times a day (TID) | ORAL | 0 refills | Status: DC | PRN

## 2017-11-20 MED ORDER — ACYCLOVIR 400 MG PO TABS: ORAL_TABLET | ORAL | 2 refills | Status: DC

## 2017-11-20 NOTE — Progress Notes (Signed)
Pre visit review using our clinic review tool, if applicable. No additional management support is needed unless otherwise documented below in the visit note. 

## 2017-11-20 NOTE — Patient Instructions (Signed)
Ice/cold pack over area for 10-15 min twice daily.  Heat (pad or rice pillow in microwave) over affected area, 10-15 minutes twice daily.   OK to take Tylenol 1000 mg (2 extra strength tabs) or 975 mg (3 regular strength tabs) every 6 hours as needed.  EXERCISES  RANGE OF MOTION (ROM) AND STRETCHING EXERCISES - Low Back Pain Most people with lower back pain will find that their symptoms get worse with excessive bending forward (flexion) or arching at the lower back (extension). The exercises that will help resolve your symptoms will focus on the opposite motion.  If you have pain, numbness or tingling which travels down into your buttocks, leg or foot, the goal of the therapy is for these symptoms to move closer to your back and eventually resolve. Sometimes, these leg symptoms will get better, but your lower back pain may worsen. This is often an indication of progress in your rehabilitation. Be very alert to any changes in your symptoms and the activities in which you participated in the 24 hours prior to the change. Sharing this information with your caregiver will allow him or her to most efficiently treat your condition. These exercises may help you when beginning to rehabilitate your injury. Your symptoms may resolve with or without further involvement from your physician, physical therapist or athletic trainer. While completing these exercises, remember:   Restoring tissue flexibility helps normal motion to return to the joints. This allows healthier, less painful movement and activity.  An effective stretch should be held for at least 30 seconds.  A stretch should never be painful. You should only feel a gentle lengthening or release in the stretched tissue. FLEXION RANGE OF MOTION AND STRETCHING EXERCISES:  STRETCH - Flexion, Single Knee to Chest   Lie on a firm bed or floor with both legs extended in front of you.  Keeping one leg in contact with the floor, bring your opposite knee  to your chest. Hold your leg in place by either grabbing behind your thigh or at your knee.  Pull until you feel a gentle stretch in your low back. Hold 30 seconds.  Slowly release your grasp and repeat the exercise with the opposite side. Repeat 2 times. Complete this exercise 3 times per week.   STRETCH - Flexion, Double Knee to Chest  Lie on a firm bed or floor with both legs extended in front of you.  Keeping one leg in contact with the floor, bring your opposite knee to your chest.  Tense your stomach muscles to support your back and then lift your other knee to your chest. Hold your legs in place by either grabbing behind your thighs or at your knees.  Pull both knees toward your chest until you feel a gentle stretch in your low back. Hold 30 seconds.  Tense your stomach muscles and slowly return one leg at a time to the floor. Repeat 2 times. Complete this exercise 3 times per week.   STRETCH - Low Trunk Rotation  Lie on a firm bed or floor. Keeping your legs in front of you, bend your knees so they are both pointed toward the ceiling and your feet are flat on the floor.  Extend your arms out to the side. This will stabilize your upper body by keeping your shoulders in contact with the floor.  Gently and slowly drop both knees together to one side until you feel a gentle stretch in your low back. Hold for 30 seconds.  Tense  your stomach muscles to support your lower back as you bring your knees back to the starting position. Repeat the exercise to the other side. Repeat 2 times. Complete this exercise at least 3 times per week.   EXTENSION RANGE OF MOTION AND FLEXIBILITY EXERCISES:  STRETCH - Extension, Prone on Elbows   Lie on your stomach on the floor, a bed will be too soft. Place your palms about shoulder width apart and at the height of your head.  Place your elbows under your shoulders. If this is too painful, stack pillows under your chest.  Allow your body to  relax so that your hips drop lower and make contact more completely with the floor.  Hold this position for 30 seconds.  Slowly return to lying flat on the floor. Repeat 2 times. Complete this exercise 3 times per week.   RANGE OF MOTION - Extension, Prone Press Ups  Lie on your stomach on the floor, a bed will be too soft. Place your palms about shoulder width apart and at the height of your head.  Keeping your back as relaxed as possible, slowly straighten your elbows while keeping your hips on the floor. You may adjust the placement of your hands to maximize your comfort. As you gain motion, your hands will come more underneath your shoulders.  Hold this position 30 seconds.  Slowly return to lying flat on the floor. Repeat 2 times. Complete this exercise 3 times per week.   RANGE OF MOTION- Quadruped, Neutral Spine   Assume a hands and knees position on a firm surface. Keep your hands under your shoulders and your knees under your hips. You may place padding under your knees for comfort.  Drop your head and point your tailbone toward the ground below you. This will round out your lower back like an angry cat. Hold this position for 30 seconds.  Slowly lift your head and release your tail bone so that your back sags into a large arch, like an old horse.  Hold this position for 30 seconds.  Repeat this until you feel limber in your low back.  Now, find your "sweet spot." This will be the most comfortable position somewhere between the two previous positions. This is your neutral spine. Once you have found this position, tense your stomach muscles to support your low back.  Hold this position for 30 seconds. Repeat 2 times. Complete this exercise 3 times per week.   STRENGTHENING EXERCISES - Low Back Sprain These exercises may help you when beginning to rehabilitate your injury. These exercises should be done near your "sweet spot." This is the neutral, low-back arch, somewhere  between fully rounded and fully arched, that is your least painful position. When performed in this safe range of motion, these exercises can be used for people who have either a flexion or extension based injury. These exercises may resolve your symptoms with or without further involvement from your physician, physical therapist or athletic trainer. While completing these exercises, remember:   Muscles can gain both the endurance and the strength needed for everyday activities through controlled exercises.  Complete these exercises as instructed by your physician, physical therapist or athletic trainer. Increase the resistance and repetitions only as guided.  You may experience muscle soreness or fatigue, but the pain or discomfort you are trying to eliminate should never worsen during these exercises. If this pain does worsen, stop and make certain you are following the directions exactly. If the pain is still  present after adjustments, discontinue the exercise until you can discuss the trouble with your caregiver.  STRENGTHENING - Deep Abdominals, Pelvic Tilt   Lie on a firm bed or floor. Keeping your legs in front of you, bend your knees so they are both pointed toward the ceiling and your feet are flat on the floor.  Tense your lower abdominal muscles to press your low back into the floor. This motion will rotate your pelvis so that your tail bone is scooping upwards rather than pointing at your feet or into the floor. With a gentle tension and even breathing, hold this position for 3 seconds. Repeat 2 times. Complete this exercise 3 times per week.   STRENGTHENING - Abdominals, Crunches   Lie on a firm bed or floor. Keeping your legs in front of you, bend your knees so they are both pointed toward the ceiling and your feet are flat on the floor. Cross your arms over your chest.  Slightly tip your chin down without bending your neck.  Tense your abdominals and slowly lift your trunk high  enough to just clear your shoulder blades. Lifting higher can put excessive stress on the lower back and does not further strengthen your abdominal muscles.  Control your return to the starting position. Repeat 2 times. Complete this exercise 3 times per week.   STRENGTHENING - Quadruped, Opposite UE/LE Lift   Assume a hands and knees position on a firm surface. Keep your hands under your shoulders and your knees under your hips. You may place padding under your knees for comfort.  Find your neutral spine and gently tense your abdominal muscles so that you can maintain this position. Your shoulders and hips should form a rectangle that is parallel with the floor and is not twisted.  Keeping your trunk steady, lift your right hand no higher than your shoulder and then your left leg no higher than your hip. Make sure you are not holding your breath. Hold this position for 30 seconds.  Continuing to keep your abdominal muscles tense and your back steady, slowly return to your starting position. Repeat with the opposite arm and leg. Repeat 2 times. Complete this exercise 3 times per week.   STRENGTHENING - Abdominals and Quadriceps, Straight Leg Raise   Lie on a firm bed or floor with both legs extended in front of you.  Keeping one leg in contact with the floor, bend the other knee so that your foot can rest flat on the floor.  Find your neutral spine, and tense your abdominal muscles to maintain your spinal position throughout the exercise.  Slowly lift your straight leg off the floor about 6 inches for a count of 3, making sure to not hold your breath.  Still keeping your neutral spine, slowly lower your leg all the way to the floor. Repeat this exercise with each leg 2 times. Complete this exercise 3 times per week.  POSTURE AND BODY MECHANICS CONSIDERATIONS - Low Back Sprain Keeping correct posture when sitting, standing or completing your activities will reduce the stress put on  different body tissues, allowing injured tissues a chance to heal and limiting painful experiences. The following are general guidelines for improved posture.  While reading these guidelines, remember:  The exercises prescribed by your provider will help you have the flexibility and strength to maintain correct postures.  The correct posture provides the best environment for your joints to work. All of your joints have less wear and tear when properly  supported by a spine with good posture. This means you will experience a healthier, less painful body.  Correct posture must be practiced with all of your activities, especially prolonged sitting and standing. Correct posture is as important when doing repetitive low-stress activities (typing) as it is when doing a single heavy-load activity (lifting).  RESTING POSITIONS Consider which positions are most painful for you when choosing a resting position. If you have pain with flexion-based activities (sitting, bending, stooping, squatting), choose a position that allows you to rest in a less flexed posture. You would want to avoid curling into a fetal position on your side. If your pain worsens with extension-based activities (prolonged standing, working overhead), avoid resting in an extended position such as sleeping on your stomach. Most people will find more comfort when they rest with their spine in a more neutral position, neither too rounded nor too arched. Lying on a non-sagging bed on your side with a pillow between your knees, or on your back with a pillow under your knees will often provide some relief. Keep in mind, being in any one position for a prolonged period of time, no matter how correct your posture, can still lead to stiffness.  PROPER SITTING POSTURE In order to minimize stress and discomfort on your spine, you must sit with correct posture. Sitting with good posture should be effortless for a healthy body. Returning to good posture is  a gradual process. Many people can work toward this most comfortably by using various supports until they have the flexibility and strength to maintain this posture on their own. When sitting with proper posture, your ears will fall over your shoulders and your shoulders will fall over your hips. You should use the back of the chair to support your upper back. Your lower back will be in a neutral position, just slightly arched. You may place a small pillow or folded towel at the base of your lower back for  support.  When working at a desk, create an environment that supports good, upright posture. Without extra support, muscles tire, which leads to excessive strain on joints and other tissues. Keep these recommendations in mind:  CHAIR:  A chair should be able to slide under your desk when your back makes contact with the back of the chair. This allows you to work closely.  The chair's height should allow your eyes to be level with the upper part of your monitor and your hands to be slightly lower than your elbows.  BODY POSITION  Your feet should make contact with the floor. If this is not possible, use a foot rest.  Keep your ears over your shoulders. This will reduce stress on your neck and low back.  INCORRECT SITTING POSTURES  If you are feeling tired and unable to assume a healthy sitting posture, do not slouch or slump. This puts excessive strain on your back tissues, causing more damage and pain. Healthier options include:  Using more support, like a lumbar pillow.  Switching tasks to something that requires you to be upright or walking.  Talking a brief walk.  Lying down to rest in a neutral-spine position.  PROLONGED STANDING WHILE SLIGHTLY LEANING FORWARD  When completing a task that requires you to lean forward while standing in one place for a long time, place either foot up on a stationary 2-4 inch high object to help maintain the best posture. When both feet are on the  ground, the lower back tends to lose its  slight inward curve. If this curve flattens (or becomes too large), then the back and your other joints will experience too much stress, tire more quickly, and can cause pain.  CORRECT STANDING POSTURES Proper standing posture should be assumed with all daily activities, even if they only take a few moments, like when brushing your teeth. As in sitting, your ears should fall over your shoulders and your shoulders should fall over your hips. You should keep a slight tension in your abdominal muscles to brace your spine. Your tailbone should point down to the ground, not behind your body, resulting in an over-extended swayback posture.   INCORRECT STANDING POSTURES  Common incorrect standing postures include a forward head, locked knees and/or an excessive swayback. WALKING Walk with an upright posture. Your ears, shoulders and hips should all line-up.  PROLONGED ACTIVITY IN A FLEXED POSITION When completing a task that requires you to bend forward at your waist or lean over a low surface, try to find a way to stabilize 3 out of 4 of your limbs. You can place a hand or elbow on your thigh or rest a knee on the surface you are reaching across. This will provide you more stability, so that your muscles do not tire as quickly. By keeping your knees relaxed, or slightly bent, you will also reduce stress across your lower back. CORRECT LIFTING TECHNIQUES  DO :  Assume a wide stance. This will provide you more stability and the opportunity to get as close as possible to the object which you are lifting.  Tense your abdominals to brace your spine. Bend at the knees and hips. Keeping your back locked in a neutral-spine position, lift using your leg muscles. Lift with your legs, keeping your back straight.  Test the weight of unknown objects before attempting to lift them.  Try to keep your elbows locked down at your sides in order get the best strength from your  shoulders when carrying an object.     Always ask for help when lifting heavy or awkward objects. INCORRECT LIFTING TECHNIQUES DO NOT:   Lock your knees when lifting, even if it is a small object.  Bend and twist. Pivot at your feet or move your feet when needing to change directions.  Assume that you can safely pick up even a paperclip without proper posture.

## 2017-11-20 NOTE — Progress Notes (Signed)
Musculoskeletal Exam  Patient: Ronnie Payne DOB: [birth year]  DOS: 11/20/2017  SUBJECTIVE:  Chief Complaint:   Chief Complaint  Patient presents with  . Back Pain    Ronnie Payne is a 77 y.o.  male for evaluation and treatment of his back pain.   Onset:  4 days ago.  No inj or change in activity.  Location: R lower Character:  sharp  Progression of issue:  is unchanged Associated symptoms: hurts with movements Denies bowel/bladder incontinence or weakness Treatment: to date has been none.   Neurovascular symptoms: no  ROS: Musculoskeletal/Extremities: +back pain Neurologic: no numbness, tingling no weakness   Past Medical History:  Diagnosis Date  . Arthritis   . Cold sore   . Frequent headaches   . History of skin cancer   . Hyperlipidemia   . Hypertension   . Prediabetes     Objective:  VITAL SIGNS: BP 104/62 (BP Location: Left Arm, Patient Position: Sitting, Cuff Size: Normal)   Pulse 66   Temp 97.9 F (36.6 C) (Oral)   Ht 5\' 4"  (1.626 m)   Wt 145 lb 6 oz (65.9 kg)   SpO2 95%   BMI 24.95 kg/m  Constitutional: Well formed, well developed. No acute distress. HENT: Normocephalic, atraumatic.  Thorax & Lungs:  No accessory muscle use Extremities: No clubbing. No cyanosis. No edema.  Skin: Warm. Dry. No erythema. No rash.  Musculoskeletal: low back.   Tenderness to palpation: yes over R lower lumbar and ES muscle group Deformity: no Ecchymosis: no Straight leg test: negative for Poor hamstring flexibility b/l. Neurologic: Normal sensory function. No focal deficits noted. DTR's equal and symmetry in LE's. No clonus. Psychiatric: Normal mood. Age appropriate judgment and insight. Alert & oriented x 3.    Assessment:  Acute low back pain without sciatica, unspecified back pain laterality - Plan: tiZANidine (ZANAFLEX) 2 MG tablet  Cold sore - Plan: acyclovir (ZOVIRAX) 400 MG tablet  Plan: Orders as above. Stretches/exercises, heat, ice,  Tylenol. Refill Acyclovir. F/u prn. The patient voiced understanding and agreement to the plan.   [place], DO 11/20/17  2:39 PM

## 2017-11-21 DIAGNOSIS — M79652 Pain in left thigh: Secondary | ICD-10-CM | POA: Diagnosis not present

## 2017-11-21 DIAGNOSIS — Z96642 Presence of left artificial hip joint: Secondary | ICD-10-CM | POA: Diagnosis not present

## 2018-01-29 ENCOUNTER — Encounter: Payer: Self-pay | Admitting: Family Medicine

## 2018-02-03 ENCOUNTER — Ambulatory Visit (INDEPENDENT_AMBULATORY_CARE_PROVIDER_SITE_OTHER): Payer: Medicare Other

## 2018-02-03 DIAGNOSIS — Z23 Encounter for immunization: Secondary | ICD-10-CM | POA: Diagnosis not present

## 2018-02-06 DIAGNOSIS — H01021 Squamous blepharitis right upper eyelid: Secondary | ICD-10-CM | POA: Diagnosis not present

## 2018-02-06 DIAGNOSIS — H01025 Squamous blepharitis left lower eyelid: Secondary | ICD-10-CM | POA: Diagnosis not present

## 2018-02-06 DIAGNOSIS — H0015 Chalazion left lower eyelid: Secondary | ICD-10-CM | POA: Diagnosis not present

## 2018-02-06 DIAGNOSIS — H01024 Squamous blepharitis left upper eyelid: Secondary | ICD-10-CM | POA: Diagnosis not present

## 2018-02-06 DIAGNOSIS — H2513 Age-related nuclear cataract, bilateral: Secondary | ICD-10-CM | POA: Diagnosis not present

## 2018-02-06 DIAGNOSIS — H02052 Trichiasis without entropian right lower eyelid: Secondary | ICD-10-CM | POA: Diagnosis not present

## 2018-02-06 DIAGNOSIS — H31091 Other chorioretinal scars, right eye: Secondary | ICD-10-CM | POA: Diagnosis not present

## 2018-02-06 DIAGNOSIS — H01022 Squamous blepharitis right lower eyelid: Secondary | ICD-10-CM | POA: Diagnosis not present

## 2018-02-10 DIAGNOSIS — H02052 Trichiasis without entropian right lower eyelid: Secondary | ICD-10-CM | POA: Diagnosis not present

## 2018-02-10 DIAGNOSIS — H0015 Chalazion left lower eyelid: Secondary | ICD-10-CM | POA: Diagnosis not present

## 2018-02-10 DIAGNOSIS — H01022 Squamous blepharitis right lower eyelid: Secondary | ICD-10-CM | POA: Diagnosis not present

## 2018-02-10 DIAGNOSIS — H01024 Squamous blepharitis left upper eyelid: Secondary | ICD-10-CM | POA: Diagnosis not present

## 2018-02-10 DIAGNOSIS — H01021 Squamous blepharitis right upper eyelid: Secondary | ICD-10-CM | POA: Diagnosis not present

## 2018-02-10 DIAGNOSIS — H31091 Other chorioretinal scars, right eye: Secondary | ICD-10-CM | POA: Diagnosis not present

## 2018-02-10 DIAGNOSIS — H2513 Age-related nuclear cataract, bilateral: Secondary | ICD-10-CM | POA: Diagnosis not present

## 2018-02-10 DIAGNOSIS — H01025 Squamous blepharitis left lower eyelid: Secondary | ICD-10-CM | POA: Diagnosis not present

## 2018-02-19 DIAGNOSIS — M7062 Trochanteric bursitis, left hip: Secondary | ICD-10-CM | POA: Diagnosis not present

## 2018-02-19 DIAGNOSIS — M25552 Pain in left hip: Secondary | ICD-10-CM | POA: Diagnosis not present

## 2018-02-19 DIAGNOSIS — Z96642 Presence of left artificial hip joint: Secondary | ICD-10-CM | POA: Diagnosis not present

## 2018-03-04 ENCOUNTER — Other Ambulatory Visit: Payer: Self-pay | Admitting: Family Medicine

## 2018-03-04 DIAGNOSIS — I10 Essential (primary) hypertension: Secondary | ICD-10-CM

## 2018-03-11 DIAGNOSIS — B353 Tinea pedis: Secondary | ICD-10-CM | POA: Diagnosis not present

## 2018-03-11 DIAGNOSIS — D225 Melanocytic nevi of trunk: Secondary | ICD-10-CM | POA: Diagnosis not present

## 2018-03-11 DIAGNOSIS — Z85820 Personal history of malignant melanoma of skin: Secondary | ICD-10-CM | POA: Diagnosis not present

## 2018-03-11 DIAGNOSIS — L820 Inflamed seborrheic keratosis: Secondary | ICD-10-CM | POA: Diagnosis not present

## 2018-03-11 DIAGNOSIS — L821 Other seborrheic keratosis: Secondary | ICD-10-CM | POA: Diagnosis not present

## 2018-03-27 DIAGNOSIS — H31091 Other chorioretinal scars, right eye: Secondary | ICD-10-CM | POA: Diagnosis not present

## 2018-03-27 DIAGNOSIS — H01022 Squamous blepharitis right lower eyelid: Secondary | ICD-10-CM | POA: Diagnosis not present

## 2018-03-27 DIAGNOSIS — H01024 Squamous blepharitis left upper eyelid: Secondary | ICD-10-CM | POA: Diagnosis not present

## 2018-03-27 DIAGNOSIS — H01021 Squamous blepharitis right upper eyelid: Secondary | ICD-10-CM | POA: Diagnosis not present

## 2018-03-27 DIAGNOSIS — H2513 Age-related nuclear cataract, bilateral: Secondary | ICD-10-CM | POA: Diagnosis not present

## 2018-03-27 DIAGNOSIS — H01025 Squamous blepharitis left lower eyelid: Secondary | ICD-10-CM | POA: Diagnosis not present

## 2018-04-12 ENCOUNTER — Encounter: Payer: Self-pay | Admitting: Family Medicine

## 2018-04-14 MED ORDER — ATORVASTATIN CALCIUM 20 MG PO TABS: 20.0000 mg | ORAL_TABLET | Freq: Every evening | ORAL | 1 refills | Status: DC

## 2018-05-13 DIAGNOSIS — H31091 Other chorioretinal scars, right eye: Secondary | ICD-10-CM | POA: Diagnosis not present

## 2018-05-13 DIAGNOSIS — H01025 Squamous blepharitis left lower eyelid: Secondary | ICD-10-CM | POA: Diagnosis not present

## 2018-05-13 DIAGNOSIS — H2513 Age-related nuclear cataract, bilateral: Secondary | ICD-10-CM | POA: Diagnosis not present

## 2018-05-13 DIAGNOSIS — H01021 Squamous blepharitis right upper eyelid: Secondary | ICD-10-CM | POA: Diagnosis not present

## 2018-05-13 DIAGNOSIS — H01022 Squamous blepharitis right lower eyelid: Secondary | ICD-10-CM | POA: Diagnosis not present

## 2018-05-13 DIAGNOSIS — H01024 Squamous blepharitis left upper eyelid: Secondary | ICD-10-CM | POA: Diagnosis not present

## 2018-05-13 DIAGNOSIS — H0015 Chalazion left lower eyelid: Secondary | ICD-10-CM | POA: Diagnosis not present

## 2018-07-10 ENCOUNTER — Encounter: Payer: Self-pay | Admitting: Family Medicine

## 2018-07-10 ENCOUNTER — Ambulatory Visit: Payer: Medicare Other | Admitting: Family Medicine

## 2018-07-10 ENCOUNTER — Ambulatory Visit (INDEPENDENT_AMBULATORY_CARE_PROVIDER_SITE_OTHER): Payer: Medicare Other | Admitting: Family Medicine

## 2018-07-10 VITALS — BP 110/68 | HR 69 | Temp 98.6°F | Ht 64.0 in | Wt 151.0 lb

## 2018-07-10 DIAGNOSIS — J069 Acute upper respiratory infection, unspecified: Secondary | ICD-10-CM | POA: Diagnosis not present

## 2018-07-10 NOTE — Patient Instructions (Signed)
Continue to push fluids, practice good hand hygiene, and cover your mouth if you cough.  If you start having fevers, shaking or shortness of breath, seek immediate care or let us know.   Let us know if you need anything.

## 2018-07-10 NOTE — Progress Notes (Signed)
Chief Complaint  Patient presents with  . Sore Throat    sick for 2 weeks  . Morgan here for URI complaints.  Duration: 2 weeks  Associated symptoms: sore throat Denies: sinus congestion, sinus pain, rhinorrhea, itchy watery eyes, ear pain, ear drainage, wheezing, shortness of breath, myalgia and fevers Treatment to date: none Sick contacts: Yes   ROS:  Const: Denies fevers HEENT: As noted in HPI Lungs: No SOB  Past Medical History:  Diagnosis Date  . Arthritis   . Cold sore   . Frequent headaches   . History of skin cancer   . Hyperlipidemia   . Hypertension   . Prediabetes     BP 110/68 (BP Location: Left Arm, Patient Position: Sitting, Cuff Size: Normal)   Pulse 69   Temp 98.6 F (37 C) (Oral)   Ht 5\' 4"  (1.626 m)   Wt 151 lb (68.5 kg)   SpO2 96%   BMI 25.92 kg/m  General: Awake, alert, appears stated age HEENT: AT, [place], ears patent b/l and TM's neg, nares patent w/o discharge, pharynx pink and without exudates, MMM Neck: No masses or asymmetry Heart: RRR Lungs: CTAB, no accessory muscle use Psych: Age appropriate judgment and insight, normal mood and affect  Upper respiratory tract infection, unspecified type  Throat improved. Concerned about coronavirus.  Continue to push fluids, practice good hand hygiene, cover mouth when coughing. Rapid step neg. F/u prn. If starting to experience fevers, shaking, or shortness of breath, seek immediate care. Pt voiced understanding and agreement to the plan.  Greater than 15 minutes were spent face to face with the patient with greater than 50% of this time spent counseling on URI s/s's, coronavirus etiology/incubation period/presentation.   [place], DO 07/10/18 4:01 PM

## 2018-07-11 ENCOUNTER — Ambulatory Visit: Payer: Self-pay

## 2018-07-11 ENCOUNTER — Other Ambulatory Visit: Payer: Self-pay | Admitting: Family Medicine

## 2018-07-11 MED ORDER — OSELTAMIVIR PHOSPHATE 75 MG PO CAPS: 75.0000 mg | ORAL_CAPSULE | Freq: Two times a day (BID) | ORAL | 0 refills | Status: AC

## 2018-07-11 NOTE — Progress Notes (Signed)
Calling with flu like symptoms, with the neg strep test, will opt for this first. If no improvement, will consider Amoxicillin for possible strep.

## 2018-07-11 NOTE — Telephone Encounter (Signed)
Called patient to advised him per Dr. Nani Ravens rx for tamiflu sent to his local pharmacy and if not better by tomorrow called to be seen at Saturday clinic.

## 2018-07-11 NOTE — Telephone Encounter (Signed)
Pt. Called to report worsening of symptoms since seen in the office yesterday.  C/o headache, body aches, and dry cough.  Reported sore throat continues.  Pt. Feels he is able to drink oral fluids adequately.  Temp. 103 degrees, Ax. at 9:00 AM.  Took Tylenol 2 tabs at that time.  Wife rechecked temp. at 9:50 AM; 101.3 axillary.   Called [facility]; spoke with Rod Holler.  Discussed with Dr. Nani Ravens, and will be calling in Rx to pt's pharmacy.  If not better by tomorrow, he should call to be seen at the Saturday clinic.  Noted that pt. Disconnected, and Rod Holler will call pt. Back with above recommendations.   Reason for Disposition . [1] Fever > 101 F (38.3 C) AND [2] age > 60    Onset of fever 103 degrees axillary at 9:00 AM; c/o headache, body aches, dry cough. Seen in office on 07/10/18; called office for further reecommendations.  Answer Assessment - Initial Assessment Questions 1. TEMPERATURE: "What is the most recent temperature?"  "How was it measured?"      103 degrees at 9:00 AM 2. ONSET: "When did the fever start?"      See above  3. SYMPTOMS: "Do you have any other symptoms besides the fever?"  (e.g., colds, headache, sore throat, earache, dry cough, rash, diarrhea, vomiting, abdominal pain)     Headache, sore throat, cough, bodyaches 4. CAUSE: If there are no symptoms, ask: "What do you think is causing the fever?"     N/a  5. CONTACTS: "Does anyone else in the family have an infection?"    2 weeks ago was exposed to friend that was coughing, and that had been traveling to [place] and [place]   6. TREATMENT: "What have you done so far to treat this fever?" (e.g., medications)     Tylenol for fever 7. IMMUNOCOMPROMISE: "Do you have of the following: diabetes, HIV positive, splenectomy, cancer chemotherapy, chronic steroid treatment, transplant patient, etc."     Denied any of the above  8. PREGNANCY: "Is there any chance you are pregnant?" "When was your last menstrual period?"    N/a  9. TRAVEL:  "Have you traveled out of the country in the last month?" (e.g., travel history, exposures)     No  Protocols used: FEVER-A-AH

## 2018-07-14 ENCOUNTER — Ambulatory Visit (HOSPITAL_BASED_OUTPATIENT_CLINIC_OR_DEPARTMENT_OTHER)
Admission: RE | Admit: 2018-07-14 | Discharge: 2018-07-14 | Disposition: A | Discharge status: Home / Self Care | Payer: Medicare Other | Source: Ambulatory Visit | Attending: Family Medicine | Admitting: Family Medicine

## 2018-07-14 ENCOUNTER — Ambulatory Visit (INDEPENDENT_AMBULATORY_CARE_PROVIDER_SITE_OTHER): Payer: Medicare Other | Admitting: Family Medicine

## 2018-07-14 ENCOUNTER — Encounter: Payer: Self-pay | Admitting: Family Medicine

## 2018-07-14 ENCOUNTER — Other Ambulatory Visit: Payer: Self-pay | Admitting: Family Medicine

## 2018-07-14 VITALS — BP 120/68 | HR 77 | Temp 99.2°F | Ht 64.0 in | Wt 148.5 lb

## 2018-07-14 DIAGNOSIS — R079 Chest pain, unspecified: Secondary | ICD-10-CM | POA: Diagnosis not present

## 2018-07-14 DIAGNOSIS — R6889 Other general symptoms and signs: Secondary | ICD-10-CM | POA: Diagnosis not present

## 2018-07-14 LAB — POCT INFLUENZA A/B
INFLUENZA A, POC: NEGATIVE
INFLUENZA B, POC: NEGATIVE

## 2018-07-14 IMAGING — DX CHEST - 2 VIEW
2 series · 2 of 2 positions shown · non-contrast
Comparison: None.

CLINICAL DATA: Left chest pain, tightness and flu symptoms for 1
week.

EXAM:
CHEST - 2 VIEW

[chest pa]
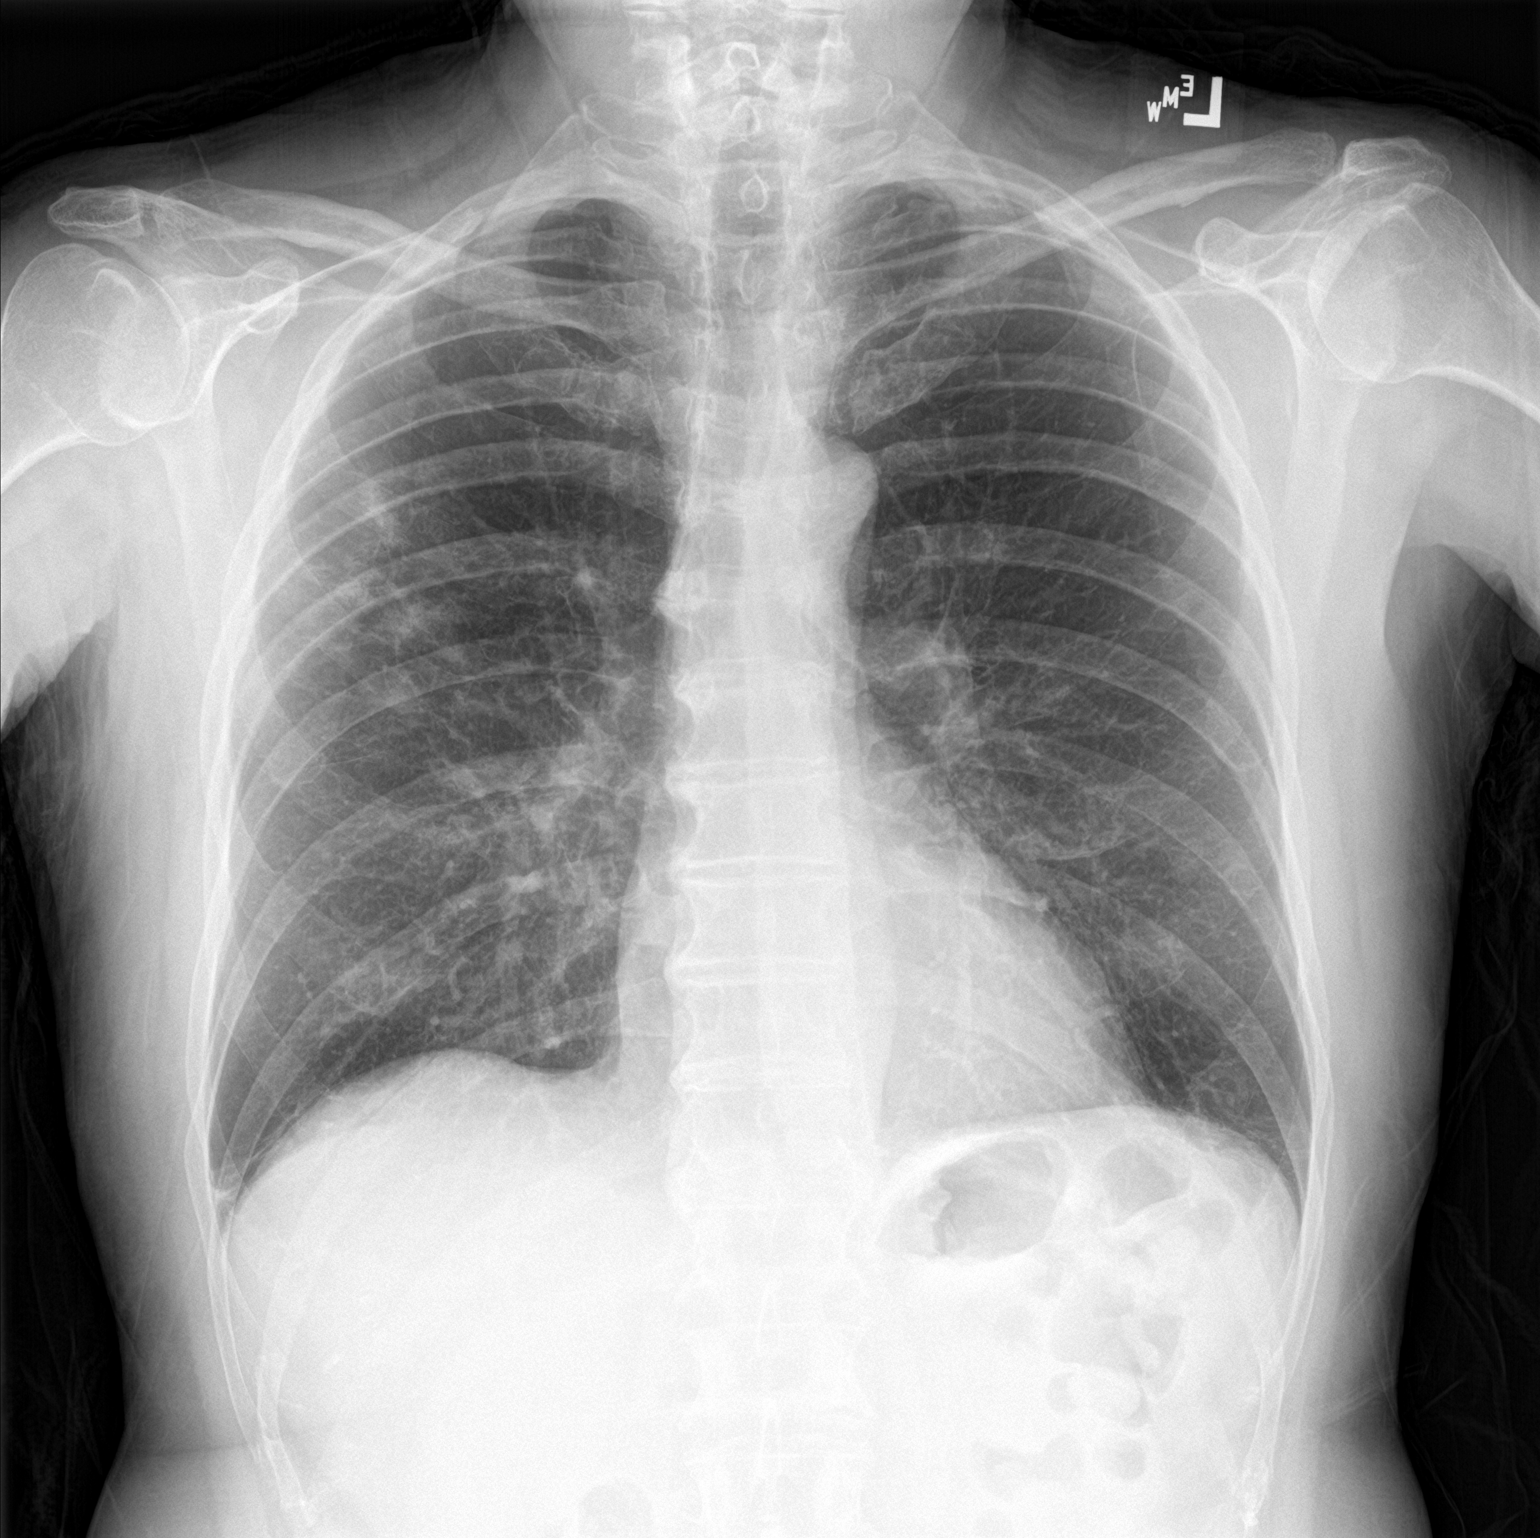

[chest lat]
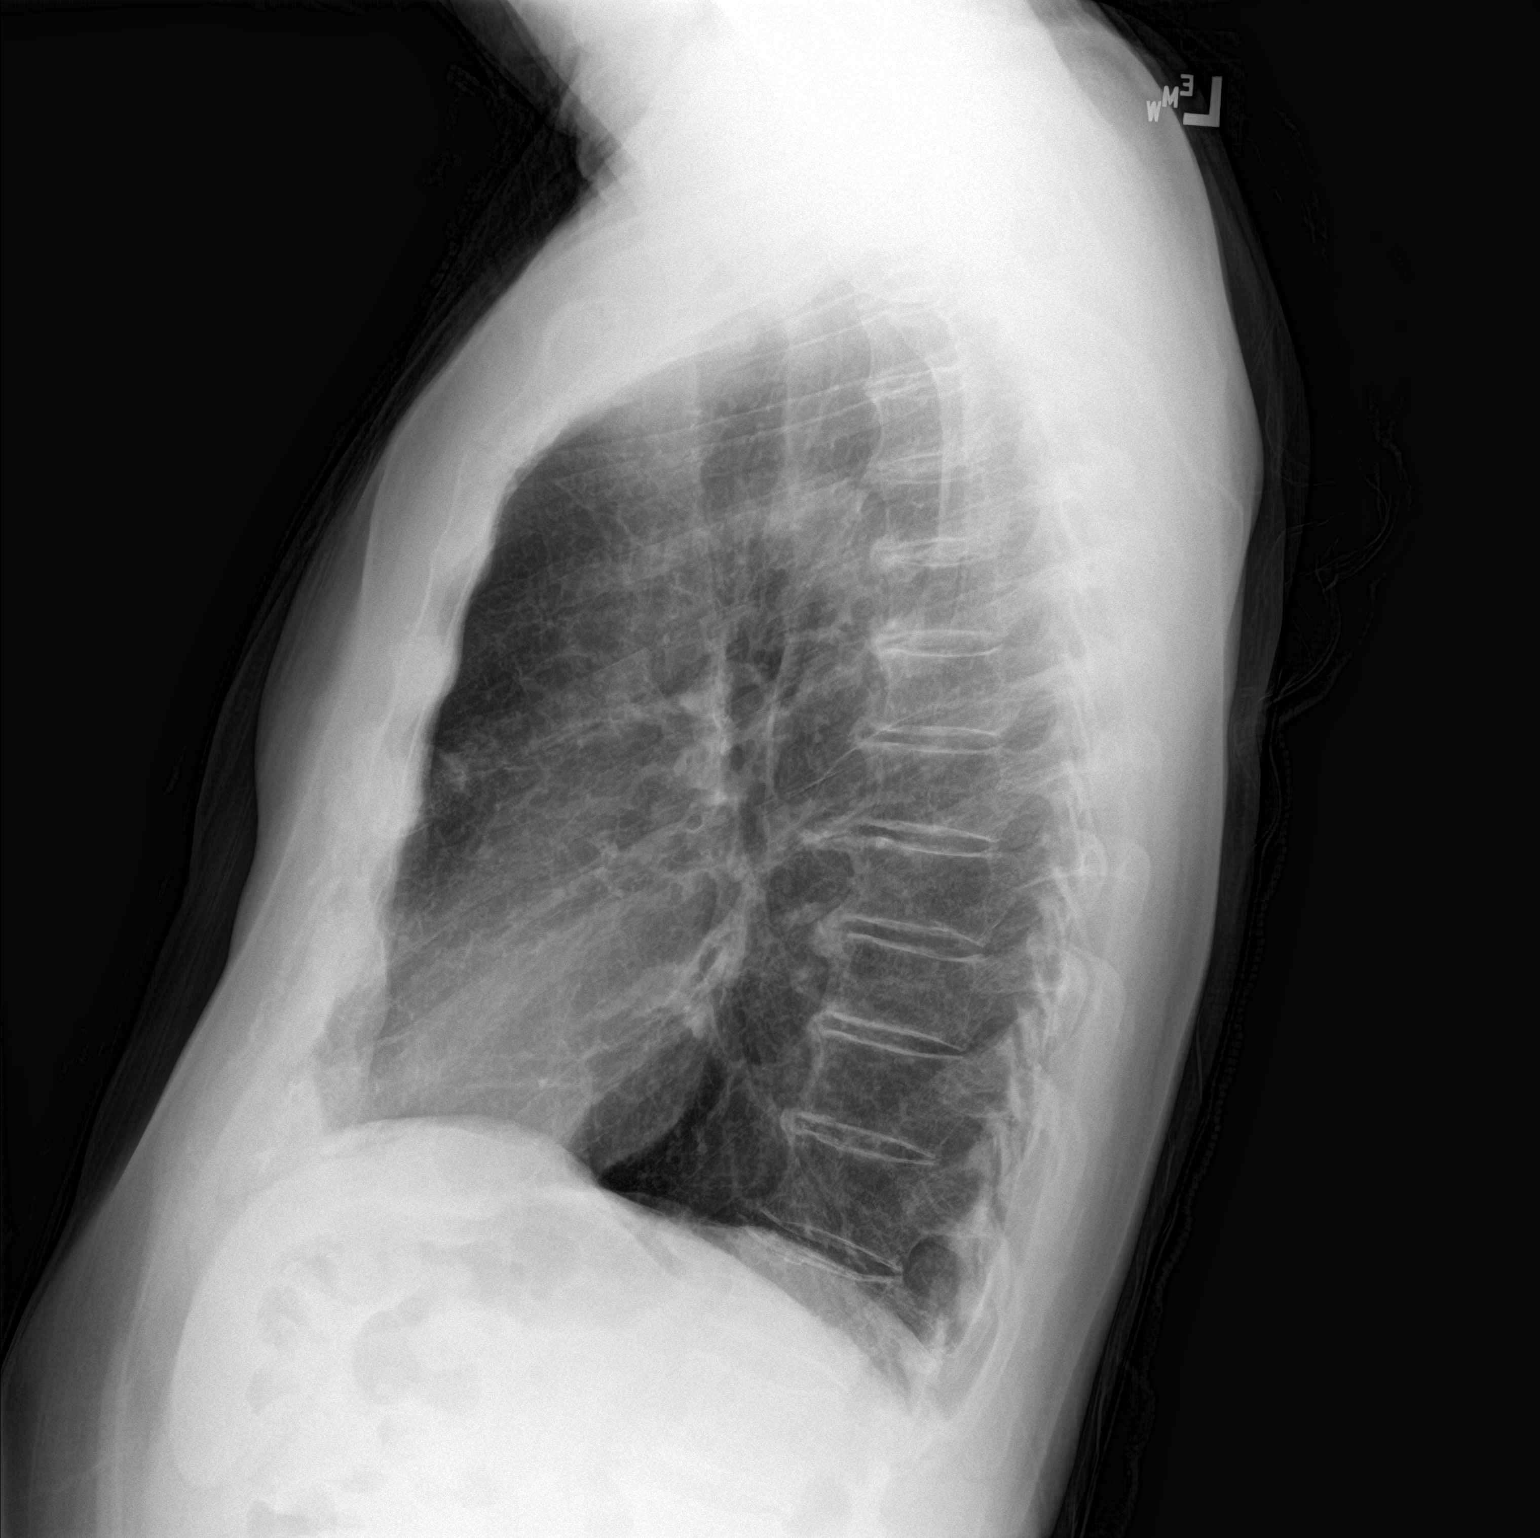

[2 of 2 positions shown; findings below may reference images not displayed]

FINDINGS: Patchy airspace disease in the right upper lobe is new since the
prior exam. The left lung is clear. Heart size is normal. No
pneumothorax or pleural fluid.
IMPRESSION: Patchy right upper lobe airspace disease most consistent with
pneumonia.

## 2018-07-14 MED ORDER — DOXYCYCLINE HYCLATE 100 MG PO TABS: 100.0000 mg | ORAL_TABLET | Freq: Two times a day (BID) | ORAL | 0 refills | Status: DC

## 2018-07-14 NOTE — Patient Instructions (Addendum)
Continue to push fluids, practice good hand hygiene, and cover your mouth if you cough.  If you start having fevers, shaking or shortness of breath, seek immediate care.  For symptoms, consider using Vick's VapoRub on chest or under nose, air humidifier, Benadryl at night, and elevating the head of the bed. Tylenol for aches and pains you may be experiencing.   OK to take Tylenol 1000 mg (2 extra strength tabs) or 975 mg (3 regular strength tabs) every 6 hours as needed.  We will be in touch regarding your X-ray.   Let us know if you need anything.

## 2018-07-14 NOTE — Progress Notes (Signed)
Chief Complaint  Patient presents with  . Fever    runny nose  . Generalized Body Aches  . Sore Throat  . Cough    Ronnie Payne here for URI complaints.  Duration: 4 days - had beeen doing OK at last visit and got worse after he left Associated symptoms: Fever (101 F), sinus congestion, rhinorrhea, sore throat, shortness of breath, myalgia and cough Denies: sinus pain, itchy watery eyes, ear fullness, ear pain, ear drainage, wheezing and shortness of breath Treatment to date: Tylenol, Vit C, Tamiflu Sick contacts: None recently  ROS:  Const: + fevers HEENT: As noted in HPI Lungs: +cough  Past Medical History:  Diagnosis Date  . Arthritis   . Cold sore   . Frequent headaches   . History of skin cancer   . Hyperlipidemia   . Hypertension   . Prediabetes     BP 120/68 (BP Location: Left Arm, Patient Position: Sitting, Cuff Size: Normal)   Pulse 77   Temp 99.2 F (37.3 C) (Oral)   Ht 5\' 4"  (1.626 m)   Wt 148 lb 8 oz (67.4 kg)   SpO2 97%   BMI 25.49 kg/m  General: Awake, alert, appears stated age HEENT: AT, [place], ears patent b/l and TM's neg, nares patent w/o discharge, pharynx pink and without exudates, MMM Neck: No masses or asymmetry Heart: RRR Lungs: CTAB, no accessory muscle use Psych: Age appropriate judgment and insight, normal mood and affect  Flu-like symptoms - Plan: DG Chest 2 View  Cont Tamiflu. Discussed flu test, he would like it as his wife is now having similar s/s's.  Ck XR to r/o imposed PNA.  Continue to push fluids, practice good hand hygiene, cover mouth when coughing. F/u prn. If starting to experience increasing fevers, shaking, or shortness of breath, seek immediate care. Pt voiced understanding and agreement to the plan.  [place], DO 07/14/18 10:11 AM

## 2018-07-14 NOTE — Addendum Note (Signed)
Addended by: Sharon Seller B on: 07/14/2018 10:32 AM   Modules accepted: Orders

## 2018-07-15 ENCOUNTER — Telehealth: Payer: Self-pay

## 2018-07-15 MED ORDER — AZITHROMYCIN 250 MG PO TABS: ORAL_TABLET | ORAL | 0 refills | Status: DC

## 2018-07-15 NOTE — Telephone Encounter (Signed)
Copied from [place] [phone]. Topic: General - Other >> Jul 15, 2018  9:08 AM Leward Quan A wrote: Reason for CRM: Patient called to say that he is allergic to the doxycycline (VIBRA-TABS) 100 MG tablet stated that it was given to him once before and he had a reaction. He was not able to describe the allergic reaction just said it made him feel miserable. He is asking for a different antibiotic to be sent to his pharmacy please. Also asking can it be put in his chart that he is allergic to this particular medication.

## 2018-07-15 NOTE — Telephone Encounter (Signed)
Done

## 2018-07-16 ENCOUNTER — Ambulatory Visit: Payer: Medicare Other | Admitting: Family Medicine

## 2018-07-23 ENCOUNTER — Encounter: Payer: Self-pay | Admitting: Family Medicine

## 2018-07-27 ENCOUNTER — Encounter: Payer: Self-pay | Admitting: Family Medicine

## 2018-07-28 MED ORDER — BENZONATATE 100 MG PO CAPS: 100.0000 mg | ORAL_CAPSULE | Freq: Three times a day (TID) | ORAL | 0 refills | Status: DC | PRN

## 2018-08-01 ENCOUNTER — Telehealth: Payer: Self-pay | Admitting: Emergency Medicine

## 2018-08-01 NOTE — Telephone Encounter (Signed)
Spoke with pt. Advised him that RB is not currently seeing pts in the office. He has been scheduled with MW on 08/04/2018 at 1:30pm. CXR is accessible in [place]. Nothing further was needed.

## 2018-08-04 ENCOUNTER — Other Ambulatory Visit: Payer: Self-pay

## 2018-08-04 ENCOUNTER — Ambulatory Visit (INDEPENDENT_AMBULATORY_CARE_PROVIDER_SITE_OTHER): Payer: Medicare Other | Admitting: Internal Medicine

## 2018-08-04 ENCOUNTER — Encounter: Payer: Self-pay | Admitting: Internal Medicine

## 2018-08-04 VITALS — BP 118/60 | HR 65 | Temp 98.0°F | Ht 64.0 in | Wt 146.0 lb

## 2018-08-04 DIAGNOSIS — J189 Pneumonia, unspecified organism: Secondary | ICD-10-CM | POA: Insufficient documentation

## 2018-08-04 DIAGNOSIS — R0789 Other chest pain: Secondary | ICD-10-CM | POA: Diagnosis not present

## 2018-08-04 DIAGNOSIS — J181 Lobar pneumonia, unspecified organism: Secondary | ICD-10-CM

## 2018-08-04 NOTE — Assessment & Plan Note (Signed)
Onset with L cp in setting of cough with R peripheral bronchopna 07/14/2018    Main rx at this point is treat the cough eg delsym/tessalon and reassure pt that since the cxr showed limited as dz on R and he's doing much better x for residual cough with risk of exposing himself or others does not need f/u cxr for now.  Ideally needs f/u in about one month, sooner if needed.   Total time devoted to counseling  > 50 % of initial 60 min office visit:  review case with pt/ discussion of options/alternatives/ personally creating written customized instructions  in presence of pt  then going over those specific  Instructions directly with the pt including how to use all of the meds but in particular covering each new medication in detail and the difference between the maintenance= "automatic" meds and the prns using an action plan format for the latter (If this problem/symptom => do that organization reading Left to right).  Please see AVS from this visit for a full list of these instructions which I personally wrote for this pt and  are unique to this visit.

## 2018-08-04 NOTE — Patient Instructions (Addendum)
You need to practice physical isolation as per present guidelines through the end of April 2020 pending further instructions.   You will need a follow up xray around Sep 05 2018 but this is not mandatory and can wait until restrictions are lifted.   For cough > delsym 2 tsp every 12 hours as needed and  Add tessalon if needed.  Call if condition worsens

## 2018-08-04 NOTE — Assessment & Plan Note (Signed)
Onset of symptoms 06/29/2018 with cxr RUL patchy focal as dz rx zpak 07/14/18 and clinically resolved 08/04/2018   Most likely this was viral pna and relatively mild (as was the case with his wife) but has resolved clinically x for persistent cough for which he should be self isolating for now (until corona restrictions have been lifted) at which time a f/u cxr can be done - of course we can see him sooner for worse cough/ sob / cp.

## 2018-08-04 NOTE — Progress Notes (Signed)
Ronnie Payne, male    DOB: 11-09-1940    MRN: [id number]   Brief patient profile:  78 yo never smoker  [place] male with contact with two relatives who had traveled to [place] with last Contact Feb 22nd and the next day sorethroat then 07/11/18  fever to 104, dry cough, runny nose > PCP eval 07/12/2018 c/w viral infection with  and 07/14/2018 onset L cp >  cxr c/w RUL peripheral pna and neg flu screen rx zithromax and fever resolved completely as did sore throat and cough 75% better but still some L upper cp with cough so self referred to Pulmonary clinic 08/04/2018      History of Present Illness  08/04/2018  Pulmonary/ 1st office eval/  Chief Complaint  Patient presents with  . Pulmonary Consult    Self referral for abnormal cxr. Pt states dxed with PNA 07/14/2018. He c/o cough- ?  sputum color.   Dyspnea:  Not limited by breathing from desired activities   Cough: sporadic, nonproductive 75% better > taking tessalon bid initially now down to none or one per day   Sleep: fine p melatonin  SABA use: none  Fever 100% resolved New L CP persistent since 07/14/2018  Not aware while sleeping/ slt worse with cough and deep breath/ not really positional, does not wake him up. Appetite good / no NS   No obvious day to day or daytime variability or assoc excess/ purulent sputum or mucus plugs or hemoptysis or   chest tightness, subjective wheeze or overt sinus or hb symptoms.   Sleeping  without nocturnal  or early am exacerbation  of respiratory  c/o's or need for noct saba. Also denies any obvious fluctuation of symptoms with weather or environmental changes or other aggravating or alleviating factors except as outlined above   No unusual exposure hx or h/o childhood pna/ asthma or knowledge of premature birth.  Current Allergies, Complete Past Medical History, Past Surgical History, Family History, and Social History were reviewed in [employer] record.  ROS   The following are not active complaints unless bolded Hoarseness, sore throat, dysphagia, dental problems, itching, sneezing,  nasal congestion or discharge of excess mucus or purulent secretions, ear ache,   fever, chills, sweats, unintended wt loss or wt gain, classically pleuritic or exertional cp,  orthopnea pnd or arm/hand swelling  or leg swelling, presyncope, palpitations, abdominal pain, anorexia, nausea, vomiting, diarrhea  or change in bowel habits or change in bladder habits, change in stools or change in urine, dysuria, hematuria,  rash, arthralgias, visual complaints, headache, numbness, weakness or ataxia or problems with walking or coordination,  change in mood or  memory.                Past Medical History:  Diagnosis Date  . Arthritis   . Cold sore   . Frequent headaches   . History of skin cancer   . Hyperlipidemia   . Hypertension   . Prediabetes     Outpatient Medications Prior to Visit  Medication Sig Dispense Refill  . atenolol (TENORMIN) 50 MG tablet TAKE 1 TABLET BY MOUTH ONCE DAILY 90 tablet 1  . atorvastatin (LIPITOR) 20 MG tablet Take 1 tablet (20 mg total) by mouth every evening. 90 tablet 1  . benzonatate (TESSALON) 100 MG capsule Take 1 capsule (100 mg total) by mouth 3 (three) times daily as needed. 30 capsule 0  . cholecalciferol (VITAMIN D) 1000 units tablet Take 1,000 Units  by mouth daily.    . fluticasone (FLONASE) 50 MCG/ACT nasal spray Place into both nostrils daily.    . Multiple Vitamins-Minerals (OCUVITE PO) Take 1 tablet by mouth daily.    . multivitamin-iron-minerals-folic acid (CENTRUM) chewable tablet Chew 1 tablet by mouth daily.        Objective:     BP 118/60 (BP Location: Left Arm, Cuff Size: Normal)   Pulse 65   Temp 98 F (36.7 C) (Oral)   Ht 5\' 4"  (1.626 m)   Wt 146 lb (66.2 kg)   SpO2 98%   BMI 25.06 kg/m   SpO2: 98 %  RA   HEENT: nl dentition, turbinates bilaterally, and oropharynx. Nl external ear canals without  cough reflex   NECK :  without JVD/Nodes/TM/ nl carotid upstrokes bilaterally   LUNGS: no acc muscle use,  Nl contour chest which is clear to A and P bilaterally without cough on insp or exp maneuvers   CV:  RRR  no s3 or murmur or increase in P2, and no edema   ABD:  soft and nontender with nl inspiratory excursion in the supine position. No bruits or organomegaly appreciated, bowel sounds nl  MS:  Nl gait/ ext warm without deformities, calf tenderness, cyanosis or clubbing No obvious joint restrictions   SKIN: warm and dry without lesions    NEURO:  alert, approp, nl sensorium with  no motor or cerebellar deficits apparent.     I personally reviewed images and agree with radiology impression as follows:  CXR:   07/14/18  Patchy right upper lobe airspace disease most consistent with pneumonia.       Assessment   CAP (community acquired pneumonia) Onset of symptoms 06/29/2018 with cxr RUL patchy focal as dz rx zpak 07/14/18 and clinically resolved 08/04/2018   Most likely this was viral pna and relatively mild (as was the case with his wife) but has resolved clinically x for persistent cough for which he should be self isolating for now (until COVID-19  restrictions have been lifted) at which time a f/u cxr can be done - of course we can see him sooner for worse cough/ sob / cp.      Chest pain, musculoskeletal Onset with L cp in setting of cough with R peripheral bronchopna 07/14/2018    Main rx at this point is treat the cough eg delsym/tessalon and reassure pt that since the cxr showed limited as dz on R and he's doing much better x for residual cough with risk of exposing himself or others does not need f/u cxr for now.  Ideally needs f/u in about one month, sooner if needed.       Total time devoted to counseling  > 50 % of initial 60 min office visit:  review case with pt/ discussion of options/alternatives/ personally creating written customized instructions  in  presence of pt  then going over those specific  Instructions directly with the pt including how to use all of the meds but in particular covering each new medication in detail and the difference between the maintenance= "automatic" meds and the prns using an action plan format for the latter (If this problem/symptom => do that organization reading Left to right).  Please see AVS from this visit for a full list of these instructions which I personally wrote for this pt and  are unique to this visit.      Christinia Gully, MD 08/04/2018

## 2018-08-16 ENCOUNTER — Encounter: Payer: Self-pay | Admitting: Family Medicine

## 2018-09-16 ENCOUNTER — Other Ambulatory Visit: Payer: Self-pay

## 2018-09-16 ENCOUNTER — Telehealth: Payer: Self-pay | Admitting: Internal Medicine

## 2018-09-16 ENCOUNTER — Ambulatory Visit: Payer: Medicare Other

## 2018-09-16 DIAGNOSIS — J189 Pneumonia, unspecified organism: Secondary | ICD-10-CM

## 2018-09-16 NOTE — Telephone Encounter (Signed)
Instructions from OV with MW 08/04/2018  You need to practice physical isolation as per present guidelines through the end of April 2020 pending further instructions.   You will need a follow up xray around Sep 05 2018 but this is not mandatory and can wait until restrictions are lifted.   For cough > delsym 2 tsp every 12 hours as needed and  Add tessalon if needed.  Call if condition worsens      Order has been placed for the cxr. Called and spoke with pt stating this to him that based on last OV, MW did want him to have a cxr sometime in May and stated to pt the order has been placed and he could come to office to have cxr performed. Pt expressed understanding. Nothing further needed.

## 2018-09-17 ENCOUNTER — Ambulatory Visit (INDEPENDENT_AMBULATORY_CARE_PROVIDER_SITE_OTHER): Payer: Medicare Other

## 2018-09-17 DIAGNOSIS — J181 Lobar pneumonia, unspecified organism: Secondary | ICD-10-CM

## 2018-09-17 DIAGNOSIS — J189 Pneumonia, unspecified organism: Secondary | ICD-10-CM | POA: Diagnosis not present

## 2018-09-17 IMAGING — DX CHEST - 2 VIEW
2 series · 2 of 2 positions shown · non-contrast
Comparison: [DATE], [DATE]

CLINICAL DATA: 78-year-old male with a history of pneumonia

EXAM:
CHEST - 2 VIEW

[chest pa]
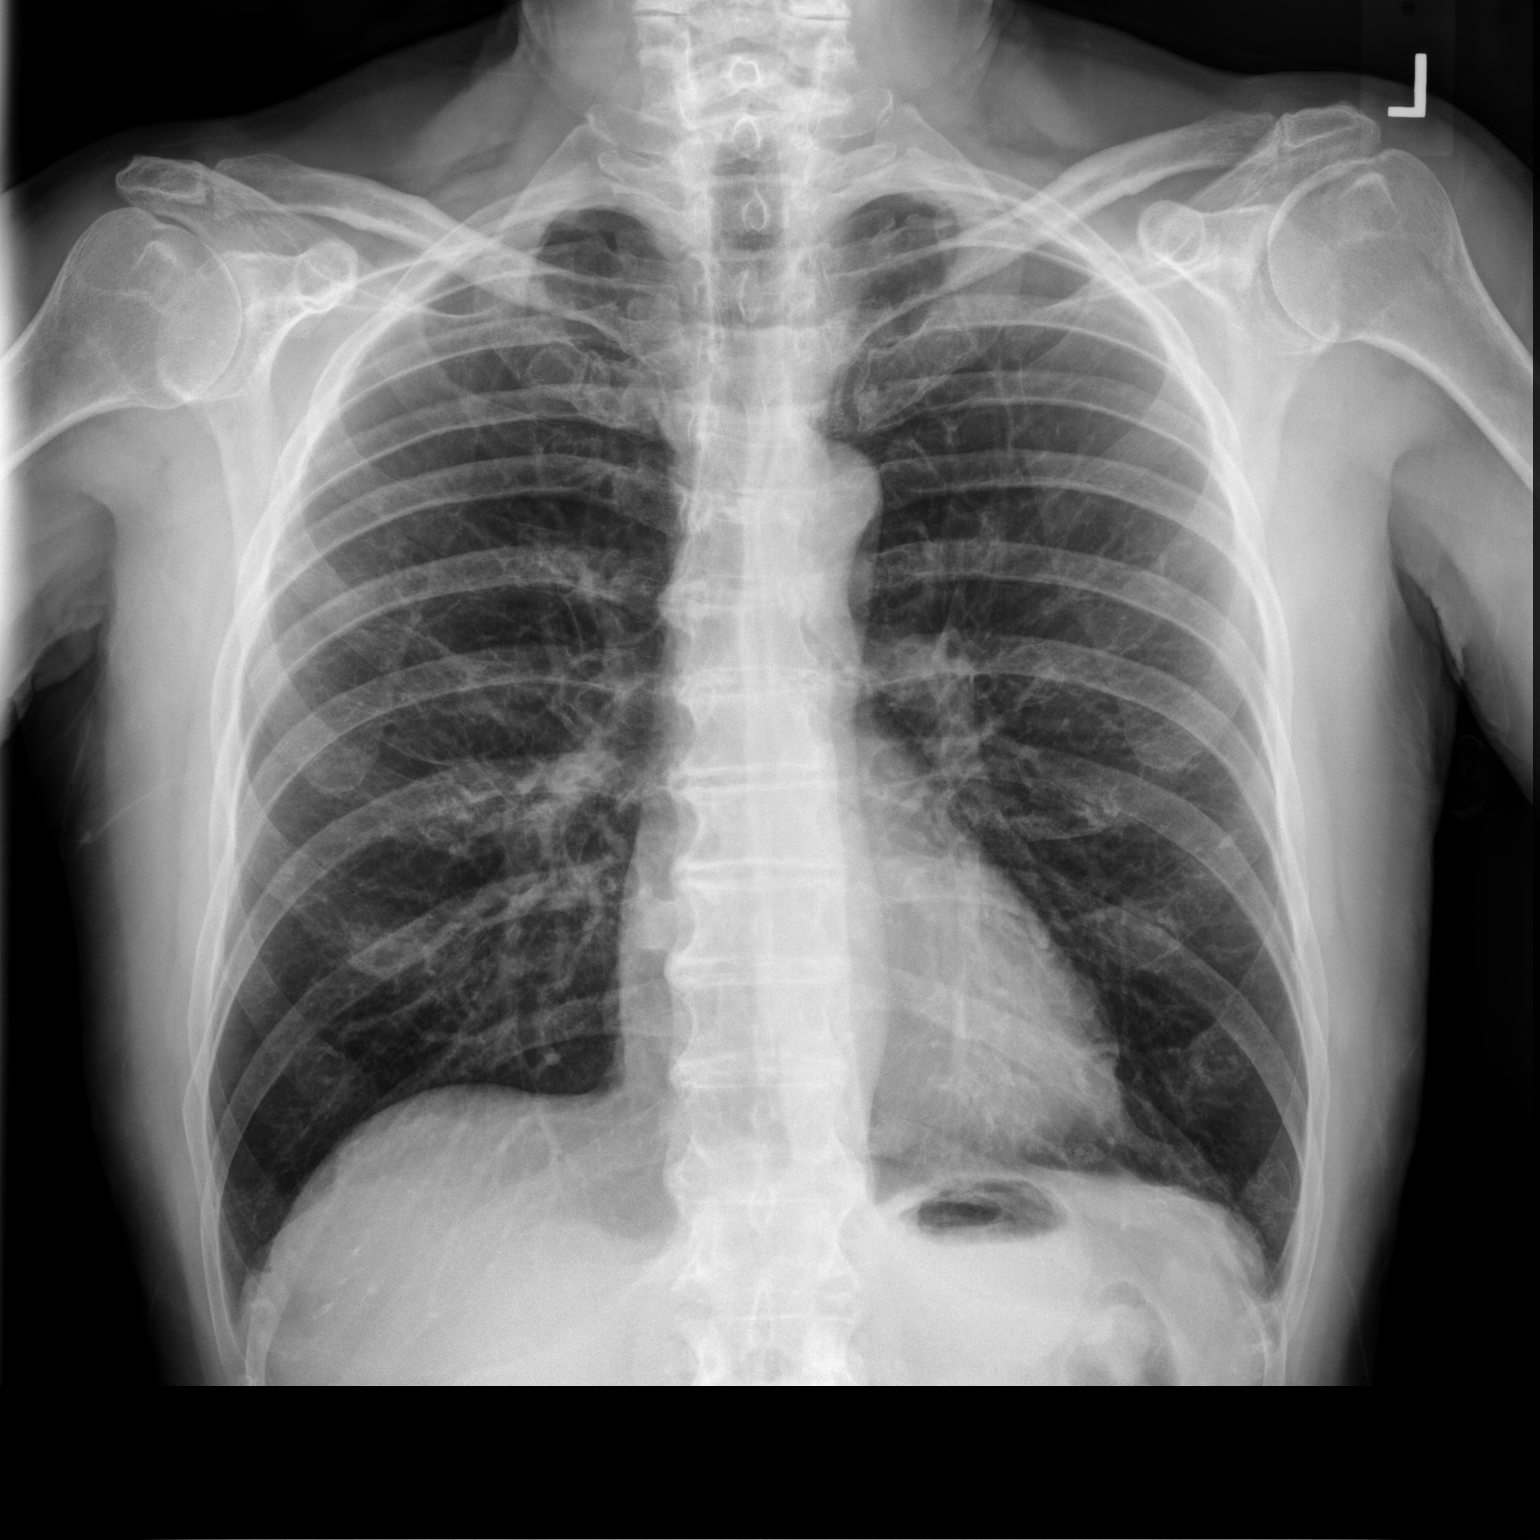

[chest lat]
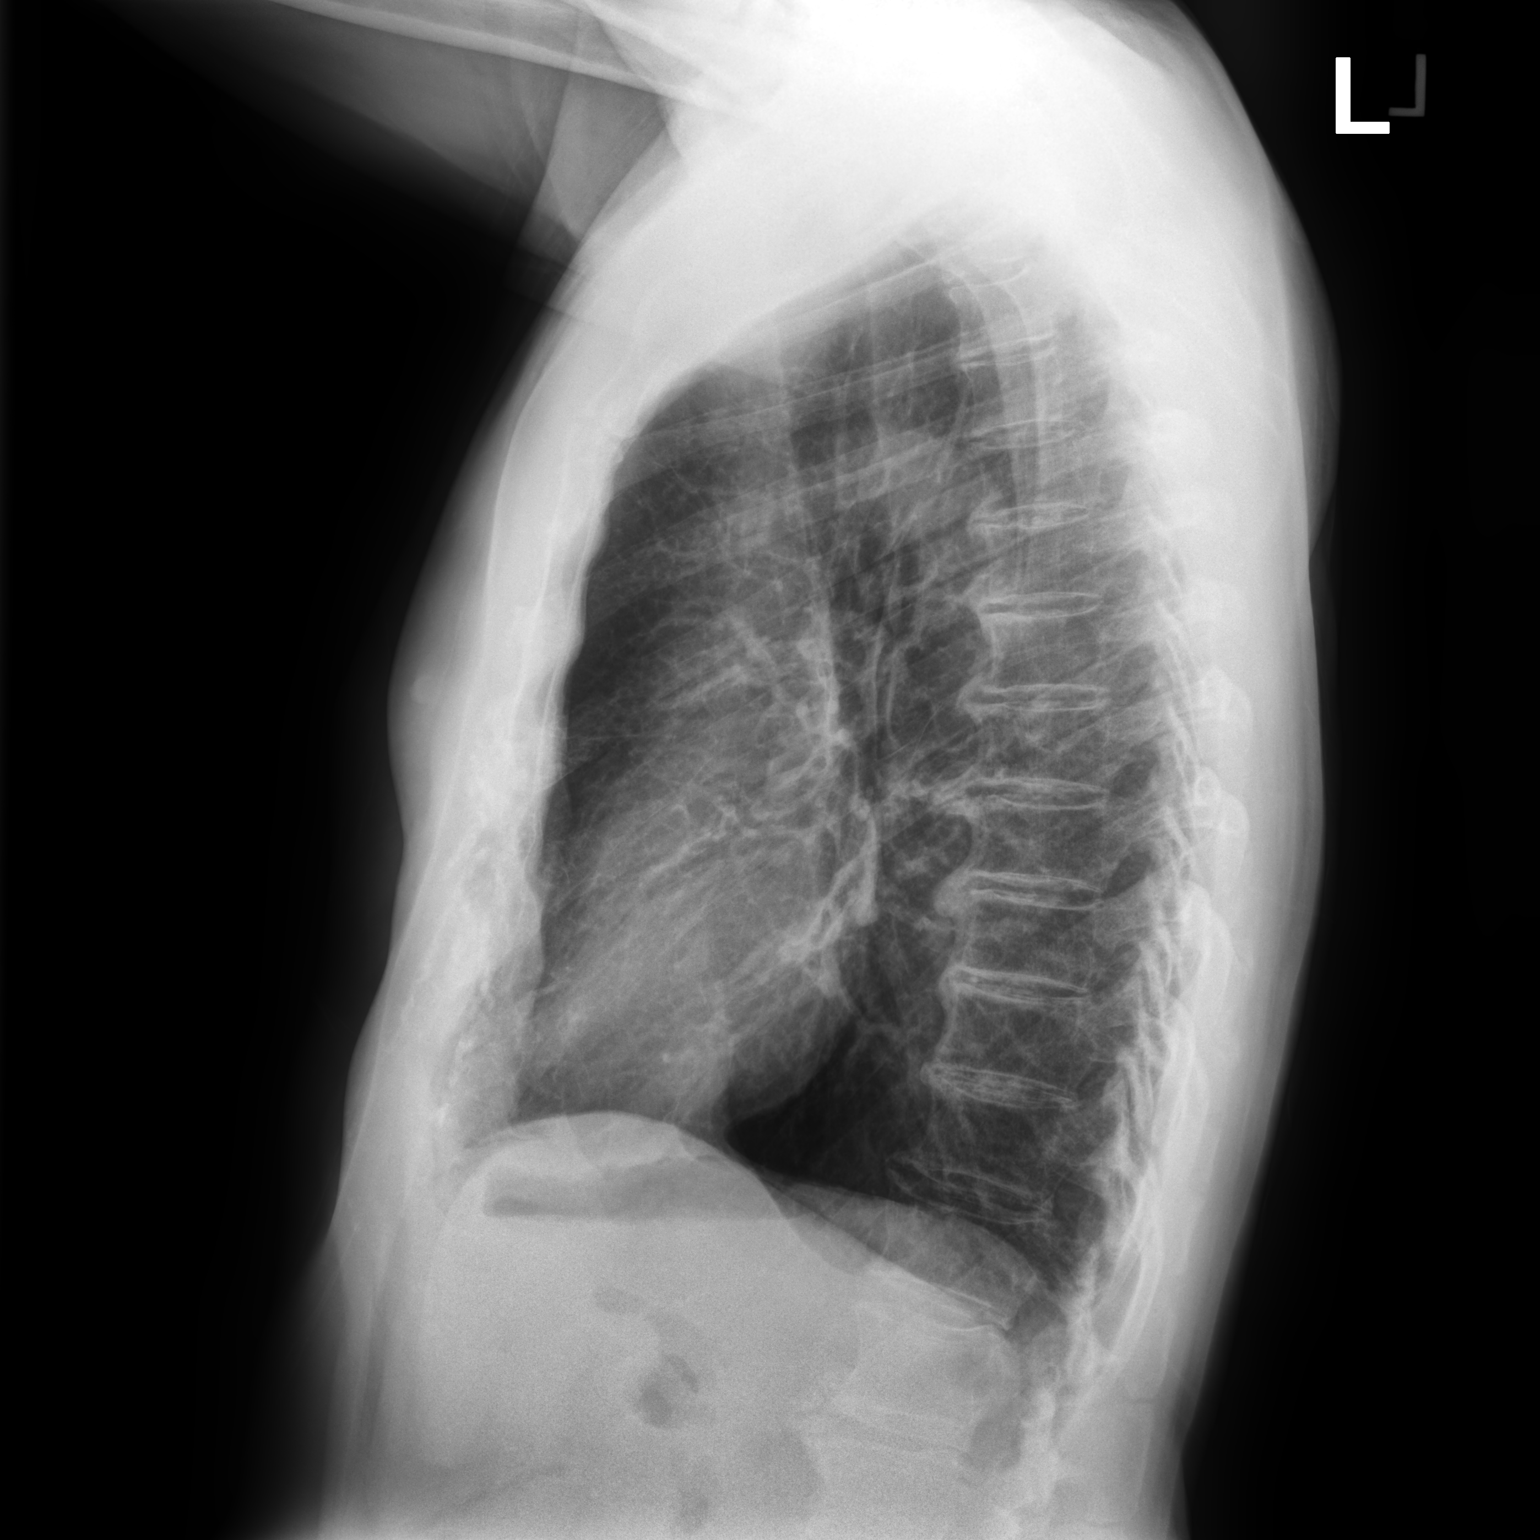

[2 of 2 positions shown; findings below may reference images not displayed]

FINDINGS: Cardiomediastinal silhouette unchanged in size and contour. No
pneumothorax or pleural effusion. No confluent airspace disease.
Coarsened interstitial markings. Improved patchy airspace opacity of
the right lung.

Stigmata of emphysema, with increased retrosternal airspace,
flattened hemidiaphragms, increased AP diameter, and hyperinflation
on the AP view.

No displaced fracture.  Degenerative changes of the spine
IMPRESSION: Improving aeration, suggesting resolving pneumonia with a background
of emphysema and chronic changes.

## 2018-09-18 ENCOUNTER — Telehealth: Payer: Self-pay | Admitting: Internal Medicine

## 2018-09-18 NOTE — Progress Notes (Signed)
Spoke with pt and notified of results per Dr. Wert. Pt verbalized understanding and denied any questions. 

## 2018-09-18 NOTE — Telephone Encounter (Signed)
Dr. Melvyn Novas after pt view report on mychart he became concerned when he read: IMPRESSION: Improving aeration, suggesting resolving pneumonia with a background of emphysema and chronic changes.  He says no one ever mentioned emphysema to him in the past. He just want to be sure there is nothing he should be concerned about. Especially since he still plays sports.    Pt was given results on yesterday:  Call pt: Reviewed cxr and all acute changes resolved, But if not 100% back to baseline With no Cough and nl ex tol needs f/u with pfts when the restrictions have been lifted.

## 2018-09-22 ENCOUNTER — Encounter: Payer: Self-pay | Admitting: Family Medicine

## 2018-09-22 ENCOUNTER — Ambulatory Visit (INDEPENDENT_AMBULATORY_CARE_PROVIDER_SITE_OTHER): Payer: Medicare Other | Admitting: Family Medicine

## 2018-09-22 ENCOUNTER — Other Ambulatory Visit: Payer: Self-pay

## 2018-09-22 VITALS — BP 122/74 | HR 64 | Temp 98.2°F | Ht 64.0 in | Wt 145.0 lb

## 2018-09-22 DIAGNOSIS — S51802A Unspecified open wound of left forearm, initial encounter: Secondary | ICD-10-CM | POA: Diagnosis not present

## 2018-09-22 DIAGNOSIS — Z7189 Other specified counseling: Secondary | ICD-10-CM | POA: Diagnosis not present

## 2018-09-22 DIAGNOSIS — Z23 Encounter for immunization: Secondary | ICD-10-CM | POA: Diagnosis not present

## 2018-09-22 MED ORDER — SILVER SULFADIAZINE 1 % EX CREA: 1.0000 "application " | TOPICAL_CREAM | Freq: Every day | CUTANEOUS | 0 refills | Status: DC

## 2018-09-22 NOTE — Progress Notes (Signed)
Chief Complaint  Patient presents with  . Wound Infection    left arm    Ronnie Payne is a 78 y.o. male here for a skin complaint.  Duration: 4 days Location: L forearm Pruritic? No Painful? Yes- slight, not severe New soaps/lotions/topicals/detergents? No Sick contacts? No Other associated symptoms: some drainage Therapies tried thus far: peroxide Does not remember when last tetanus shot was.   Also believes he may have had COVID19 early in spring. He is questioning about antibody testing stating his wife has been tested and he would like to be as well. He has no current s/s's such as sob, fevers, cough, myalgias, or GI s/s's.   ROS:  Const: No fevers Skin: As noted in HPI  Past Medical History:  Diagnosis Date  . Arthritis   . Cold sore   . Frequent headaches   . History of skin cancer   . Hyperlipidemia   . Hypertension   . Prediabetes     BP 122/74 (BP Location: Left Arm, Patient Position: Sitting, Cuff Size: Normal)   Pulse 64   Temp 98.2 F (36.8 C) (Oral)   Ht 5\' 4"  (1.626 m)   Wt 145 lb (65.8 kg)   SpO2 98%   BMI 24.89 kg/m  Gen: awake, alert, appearing stated age Lungs: No accessory muscle use Skin: see below. No drainage, excessive warmth, TTP, fluctuance, excoriation Psych: Age appropriate judgment and insight   L lateral forearm   Open wound of left forearm, initial encounter - Plan: silver sulfADIAZINE (SILVADENE) 1 % cream  Educated About Covid-19 Virus Infection - Plan: SARS-CoV-2 Antibody, IgM  I don't think it is infected, above to prevent infection. Warning signs and symptoms verbalized and written down in AVS.  Dressed today. Tdap. Counseled on downfall of above test being positive for any coronavirus and the lack of knowledge for the implications of a positive test. He would like to proceed despite this.  F/u prn. The patient voiced understanding and agreement to the plan.  [place], DO 09/22/18 11:51 AM

## 2018-09-22 NOTE — Patient Instructions (Addendum)
Keep the area clean dry.  When you do wash it, use only soap and water. Do not vigorously scrub. Keep the area clean and dry.   Things to look out for: increasing pain not relieved by ibuprofen/acetaminophen, fevers, spreading redness, drainage of pus, or foul odor.  Give [place] 5-7 business days to get the results of your labs back.   Send me a MyChart message with a picture if anything changes (for the worst) with the skin.   Let us know if you need anything.

## 2018-09-23 LAB — SARS-COV-2 ANTIBODY, IGM: SARS-CoV-2 Antibody, IgM: NEGATIVE

## 2018-09-26 DIAGNOSIS — H0102A Squamous blepharitis right eye, upper and lower eyelids: Secondary | ICD-10-CM | POA: Diagnosis not present

## 2018-09-26 DIAGNOSIS — H0102B Squamous blepharitis left eye, upper and lower eyelids: Secondary | ICD-10-CM | POA: Diagnosis not present

## 2018-09-30 ENCOUNTER — Other Ambulatory Visit: Payer: Self-pay | Admitting: Family Medicine

## 2018-09-30 DIAGNOSIS — L239 Allergic contact dermatitis, unspecified cause: Secondary | ICD-10-CM | POA: Diagnosis not present

## 2018-09-30 DIAGNOSIS — I10 Essential (primary) hypertension: Secondary | ICD-10-CM

## 2018-09-30 DIAGNOSIS — D225 Melanocytic nevi of trunk: Secondary | ICD-10-CM | POA: Diagnosis not present

## 2018-09-30 DIAGNOSIS — L0100 Impetigo, unspecified: Secondary | ICD-10-CM | POA: Diagnosis not present

## 2018-09-30 DIAGNOSIS — Z85828 Personal history of other malignant neoplasm of skin: Secondary | ICD-10-CM | POA: Diagnosis not present

## 2018-09-30 DIAGNOSIS — B360 Pityriasis versicolor: Secondary | ICD-10-CM | POA: Diagnosis not present

## 2018-09-30 DIAGNOSIS — L820 Inflamed seborrheic keratosis: Secondary | ICD-10-CM | POA: Diagnosis not present

## 2018-10-15 ENCOUNTER — Other Ambulatory Visit: Payer: Self-pay | Admitting: Family Medicine

## 2018-11-10 DIAGNOSIS — R351 Nocturia: Secondary | ICD-10-CM | POA: Diagnosis not present

## 2018-11-10 DIAGNOSIS — N5201 Erectile dysfunction due to arterial insufficiency: Secondary | ICD-10-CM | POA: Diagnosis not present

## 2018-11-19 ENCOUNTER — Other Ambulatory Visit: Payer: Self-pay | Admitting: Family Medicine

## 2018-11-19 DIAGNOSIS — I10 Essential (primary) hypertension: Secondary | ICD-10-CM

## 2018-11-19 MED ORDER — ATORVASTATIN CALCIUM 20 MG PO TABS: 20.0000 mg | ORAL_TABLET | Freq: Every evening | ORAL | 1 refills | Status: DC

## 2018-11-19 MED ORDER — ATENOLOL 50 MG PO TABS: 50.0000 mg | ORAL_TABLET | Freq: Every day | ORAL | 1 refills | Status: DC

## 2018-11-25 DIAGNOSIS — M25552 Pain in left hip: Secondary | ICD-10-CM | POA: Diagnosis not present

## 2018-11-25 DIAGNOSIS — Z96641 Presence of right artificial hip joint: Secondary | ICD-10-CM | POA: Diagnosis not present

## 2018-12-02 DIAGNOSIS — Z96642 Presence of left artificial hip joint: Secondary | ICD-10-CM | POA: Diagnosis not present

## 2018-12-02 DIAGNOSIS — M7632 Iliotibial band syndrome, left leg: Secondary | ICD-10-CM | POA: Diagnosis not present

## 2018-12-02 DIAGNOSIS — M6281 Muscle weakness (generalized): Secondary | ICD-10-CM | POA: Diagnosis not present

## 2018-12-03 DIAGNOSIS — Z96642 Presence of left artificial hip joint: Secondary | ICD-10-CM | POA: Diagnosis not present

## 2018-12-03 DIAGNOSIS — M6281 Muscle weakness (generalized): Secondary | ICD-10-CM | POA: Diagnosis not present

## 2018-12-03 DIAGNOSIS — M7632 Iliotibial band syndrome, left leg: Secondary | ICD-10-CM | POA: Diagnosis not present

## 2018-12-05 ENCOUNTER — Other Ambulatory Visit: Payer: Self-pay

## 2018-12-10 DIAGNOSIS — M7632 Iliotibial band syndrome, left leg: Secondary | ICD-10-CM | POA: Diagnosis not present

## 2018-12-10 DIAGNOSIS — Z96642 Presence of left artificial hip joint: Secondary | ICD-10-CM | POA: Diagnosis not present

## 2018-12-10 DIAGNOSIS — M6281 Muscle weakness (generalized): Secondary | ICD-10-CM | POA: Diagnosis not present

## 2018-12-17 DIAGNOSIS — Z96642 Presence of left artificial hip joint: Secondary | ICD-10-CM | POA: Diagnosis not present

## 2018-12-17 DIAGNOSIS — M6281 Muscle weakness (generalized): Secondary | ICD-10-CM | POA: Diagnosis not present

## 2018-12-17 DIAGNOSIS — M7632 Iliotibial band syndrome, left leg: Secondary | ICD-10-CM | POA: Diagnosis not present

## 2018-12-22 ENCOUNTER — Other Ambulatory Visit: Payer: Self-pay

## 2018-12-22 ENCOUNTER — Encounter: Payer: Self-pay | Admitting: Family Medicine

## 2018-12-22 ENCOUNTER — Ambulatory Visit (INDEPENDENT_AMBULATORY_CARE_PROVIDER_SITE_OTHER): Payer: Medicare Other | Admitting: Family Medicine

## 2018-12-22 VITALS — BP 122/82 | HR 65 | Temp 95.0°F | Ht 64.0 in | Wt 147.4 lb

## 2018-12-22 DIAGNOSIS — M67441 Ganglion, right hand: Secondary | ICD-10-CM | POA: Diagnosis not present

## 2018-12-22 NOTE — Progress Notes (Signed)
Chief Complaint  Patient presents with  . Follow-up    lump in finger    Ronnie Payne is a 78 y.o. male here for a skin complaint.  Duration: 1 month Location: R pinky Pruritic? No Painful? Yes Drainage? No New soaps/lotions/topicals/detergents? No Sick contacts? No Other associated symptoms: none Therapies tried thus far: none  ROS:  Const: No fevers Skin: As noted in HPI  Past Medical History:  Diagnosis Date  . Arthritis   . Cold sore   . Frequent headaches   . History of skin cancer   . Hyperlipidemia   . Hypertension   . Prediabetes     BP 122/82 (BP Location: Left Arm, Patient Position: Sitting, Cuff Size: Normal)   Pulse 65   Temp (!) 95 F (35 C) (Temporal)   Ht 5\' 4"  (1.626 m)   Wt 147 lb 6 oz (66.8 kg)   SpO2 98%   BMI 25.30 kg/m  Gen: awake, alert, appearing stated age Lungs: No accessory muscle use Skin: there is a gelatinous feeling and raised lesion on the dorsum on R middle phalanx of 5th digit. It is freely moveable and there is no pigment changes. No drainage, erythema, TTP, fluctuance, excoriation Psych: Age appropriate judgment and insight  Ganglion cyst of finger of right hand - Plan: Ambulatory referral to Hand Surgery  Discussed watchful waiting, injection/aspiration, referral. He opted for referral.  F/u prn. The patient voiced understanding and agreement to the plan.  [place], DO 12/22/18 2:28 PM

## 2018-12-22 NOTE — Patient Instructions (Addendum)
If you do not hear anything about your referral in the next 1-2 weeks, call our office and ask for an update.   Ganglion Cyst  A ganglion cyst is a non-cancerous, fluid-filled lump that occurs near a joint or tendon. The cyst grows out of a joint or the lining of a tendon. Ganglion cysts most often develop in the hand or wrist, but they can also develop in the shoulder, elbow, hip, knee, ankle, or foot. Ganglion cysts are ball-shaped or egg-shaped. Their size can range from the size of a pea to larger than a grape. Increased activity may cause the cyst to get bigger because more fluid starts to build up. What are the causes? The exact cause of this condition is not known, but it may be related to:  Inflammation or irritation around the joint.  An injury.  Repetitive movements or overuse.  Arthritis. What increases the risk? You are more likely to develop this condition if:  You are a woman.  You are 58-52 years old. What are the signs or symptoms? The main symptom of this condition is a lump. It most often appears on the hand or wrist. In many cases, there are no other symptoms, but a cyst can sometimes cause:  Tingling.  Pain.  Numbness.  Muscle weakness.  Weak grip.  Less range of motion in a joint. How is this diagnosed? Ganglion cysts are usually diagnosed based on a physical exam. Your health care provider will feel the lump and may shine a light next to it. If it is a ganglion cyst, the light will likely shine through it. Your health care provider may order an X-ray, ultrasound, or MRI to rule out other conditions. How is this treated? Ganglion cysts often go away on their own without treatment. If you have pain or other symptoms, treatment may be needed. Treatment is also needed if the ganglion cyst limits your movement or if it gets infected. Treatment may include:  Wearing a brace or splint on your wrist or finger.  Taking anti-inflammatory medicine.  Having  fluid drained from the lump with a needle (aspiration).  Getting a steroid injected into the joint.  Having surgery to remove the ganglion cyst.  Placing a pad on your shoe or wearing shoes that will not rub against the cyst if it is on your foot. Follow these instructions at home:  Do not press on the ganglion cyst, poke it with a needle, or hit it.  Take over-the-counter and prescription medicines only as told by your health care provider.  If you have a brace or splint: ? Wear it as told by your health care provider. ? Remove it as told by your health care provider. Ask if you need to remove it when you take a shower or a bath.  Watch your ganglion cyst for any changes.  Keep all follow-up visits as told by your health care provider. This is important. Contact a health care provider if:  Your ganglion cyst becomes larger or more painful.  You have pus coming from the lump.  You have weakness or numbness in the affected area.  You have a fever or chills. Get help right away if:  You have a fever and have any of these in the cyst area: ? Increased redness. ? Red streaks. ? Swelling. Summary  A ganglion cyst is a non-cancerous, fluid-filled lump that occurs near a joint or tendon.  Ganglion cysts most often develop in the hand or wrist, but  they can also develop in the shoulder, elbow, hip, knee, ankle, or foot.  Ganglion cysts often go away on their own without treatment. This information is not intended to replace advice given to you by your health care provider. Make sure you discuss any questions you have with your health care provider. Document Released: 04/20/2000 Document Revised: 04/05/2017 Document Reviewed: 12/21/2016 Elsevier Patient Education  2020 [employer].

## 2018-12-24 DIAGNOSIS — Z96642 Presence of left artificial hip joint: Secondary | ICD-10-CM | POA: Diagnosis not present

## 2018-12-24 DIAGNOSIS — M6281 Muscle weakness (generalized): Secondary | ICD-10-CM | POA: Diagnosis not present

## 2018-12-24 DIAGNOSIS — M7632 Iliotibial band syndrome, left leg: Secondary | ICD-10-CM | POA: Diagnosis not present

## 2018-12-31 DIAGNOSIS — M7632 Iliotibial band syndrome, left leg: Secondary | ICD-10-CM | POA: Diagnosis not present

## 2018-12-31 DIAGNOSIS — Z96642 Presence of left artificial hip joint: Secondary | ICD-10-CM | POA: Diagnosis not present

## 2018-12-31 DIAGNOSIS — M6281 Muscle weakness (generalized): Secondary | ICD-10-CM | POA: Diagnosis not present

## 2019-01-06 DIAGNOSIS — M67441 Ganglion, right hand: Secondary | ICD-10-CM | POA: Diagnosis not present

## 2019-01-07 DIAGNOSIS — M7632 Iliotibial band syndrome, left leg: Secondary | ICD-10-CM | POA: Diagnosis not present

## 2019-01-07 DIAGNOSIS — Z96642 Presence of left artificial hip joint: Secondary | ICD-10-CM | POA: Diagnosis not present

## 2019-01-07 DIAGNOSIS — M6281 Muscle weakness (generalized): Secondary | ICD-10-CM | POA: Diagnosis not present

## 2019-01-14 DIAGNOSIS — M6281 Muscle weakness (generalized): Secondary | ICD-10-CM | POA: Diagnosis not present

## 2019-01-14 DIAGNOSIS — M7632 Iliotibial band syndrome, left leg: Secondary | ICD-10-CM | POA: Diagnosis not present

## 2019-01-14 DIAGNOSIS — Z96642 Presence of left artificial hip joint: Secondary | ICD-10-CM | POA: Diagnosis not present

## 2019-01-17 ENCOUNTER — Other Ambulatory Visit: Payer: Self-pay | Admitting: Family Medicine

## 2019-01-28 DIAGNOSIS — M7632 Iliotibial band syndrome, left leg: Secondary | ICD-10-CM | POA: Diagnosis not present

## 2019-01-28 DIAGNOSIS — M6281 Muscle weakness (generalized): Secondary | ICD-10-CM | POA: Diagnosis not present

## 2019-01-28 DIAGNOSIS — Z96642 Presence of left artificial hip joint: Secondary | ICD-10-CM | POA: Diagnosis not present

## 2019-02-04 DIAGNOSIS — M7632 Iliotibial band syndrome, left leg: Secondary | ICD-10-CM | POA: Diagnosis not present

## 2019-02-04 DIAGNOSIS — M6281 Muscle weakness (generalized): Secondary | ICD-10-CM | POA: Diagnosis not present

## 2019-02-04 DIAGNOSIS — Z96642 Presence of left artificial hip joint: Secondary | ICD-10-CM | POA: Diagnosis not present

## 2019-02-11 DIAGNOSIS — M7632 Iliotibial band syndrome, left leg: Secondary | ICD-10-CM | POA: Diagnosis not present

## 2019-02-11 DIAGNOSIS — Z96642 Presence of left artificial hip joint: Secondary | ICD-10-CM | POA: Diagnosis not present

## 2019-02-11 DIAGNOSIS — M6281 Muscle weakness (generalized): Secondary | ICD-10-CM | POA: Diagnosis not present

## 2019-03-25 ENCOUNTER — Encounter: Payer: Self-pay | Admitting: Family Medicine

## 2019-03-25 DIAGNOSIS — I10 Essential (primary) hypertension: Secondary | ICD-10-CM

## 2019-03-26 MED ORDER — ATENOLOL 50 MG PO TABS: 50.0000 mg | ORAL_TABLET | Freq: Every day | ORAL | 1 refills | Status: DC

## 2019-04-20 ENCOUNTER — Other Ambulatory Visit: Payer: Self-pay | Admitting: Family Medicine

## 2019-04-29 ENCOUNTER — Encounter: Payer: Self-pay | Admitting: Family Medicine

## 2019-04-30 MED ORDER — TEMAZEPAM 15 MG PO CAPS: 15.0000 mg | ORAL_CAPSULE | Freq: Every evening | ORAL | 0 refills | Status: DC | PRN

## 2019-06-15 DIAGNOSIS — H0102A Squamous blepharitis right eye, upper and lower eyelids: Secondary | ICD-10-CM | POA: Diagnosis not present

## 2019-06-15 DIAGNOSIS — H25813 Combined forms of age-related cataract, bilateral: Secondary | ICD-10-CM | POA: Diagnosis not present

## 2019-06-15 DIAGNOSIS — H0102B Squamous blepharitis left eye, upper and lower eyelids: Secondary | ICD-10-CM | POA: Diagnosis not present

## 2019-06-15 DIAGNOSIS — H31091 Other chorioretinal scars, right eye: Secondary | ICD-10-CM | POA: Diagnosis not present

## 2019-06-24 DIAGNOSIS — J343 Hypertrophy of nasal turbinates: Secondary | ICD-10-CM | POA: Diagnosis not present

## 2019-06-24 DIAGNOSIS — J342 Deviated nasal septum: Secondary | ICD-10-CM | POA: Diagnosis not present

## 2019-07-16 ENCOUNTER — Other Ambulatory Visit: Payer: Self-pay | Admitting: Family Medicine

## 2019-07-23 DIAGNOSIS — Z01818 Encounter for other preprocedural examination: Secondary | ICD-10-CM | POA: Diagnosis not present

## 2019-07-27 DIAGNOSIS — J342 Deviated nasal septum: Secondary | ICD-10-CM | POA: Diagnosis not present

## 2019-07-27 DIAGNOSIS — J343 Hypertrophy of nasal turbinates: Secondary | ICD-10-CM | POA: Diagnosis not present

## 2019-07-30 ENCOUNTER — Encounter: Payer: Self-pay | Admitting: Family Medicine

## 2019-07-31 ENCOUNTER — Other Ambulatory Visit: Payer: Self-pay | Admitting: Family Medicine

## 2019-07-31 DIAGNOSIS — B001 Herpesviral vesicular dermatitis: Secondary | ICD-10-CM

## 2019-07-31 MED ORDER — ACYCLOVIR 400 MG PO TABS: ORAL_TABLET | ORAL | 2 refills | Status: DC

## 2019-08-31 ENCOUNTER — Encounter: Payer: Self-pay | Admitting: Family Medicine

## 2019-08-31 ENCOUNTER — Other Ambulatory Visit: Payer: Self-pay

## 2019-08-31 ENCOUNTER — Ambulatory Visit (INDEPENDENT_AMBULATORY_CARE_PROVIDER_SITE_OTHER): Payer: Medicare Other | Admitting: Family Medicine

## 2019-08-31 VITALS — BP 118/68 | HR 59 | Temp 97.0°F | Ht 64.0 in | Wt 147.2 lb

## 2019-08-31 DIAGNOSIS — B001 Herpesviral vesicular dermatitis: Secondary | ICD-10-CM | POA: Diagnosis not present

## 2019-08-31 DIAGNOSIS — E785 Hyperlipidemia, unspecified: Secondary | ICD-10-CM | POA: Diagnosis not present

## 2019-08-31 DIAGNOSIS — R7303 Prediabetes: Secondary | ICD-10-CM | POA: Diagnosis not present

## 2019-08-31 LAB — COMPREHENSIVE METABOLIC PANEL
ALT: 30 U/L (ref 0–53)
AST: 24 U/L (ref 0–37)
Albumin: 4 g/dL (ref 3.5–5.2)
Alkaline Phosphatase: 98 U/L (ref 39–117)
BUN: 19 mg/dL (ref 6–23)
CO2: 30 mEq/L (ref 19–32)
Calcium: 9.2 mg/dL (ref 8.4–10.5)
Chloride: 101 mEq/L (ref 96–112)
Creatinine, Ser: 0.91 mg/dL (ref 0.40–1.50)
GFR: 80.36 mL/min (ref >60.00)
Glucose, Bld: 88 mg/dL (ref 70–99)
Potassium: 4.2 mEq/L (ref 3.5–5.1)
Sodium: 136 mEq/L (ref 135–145)
Total Bilirubin: 0.7 mg/dL (ref 0.2–1.2)
Total Protein: 6.1 g/dL (ref 6.0–8.3)

## 2019-08-31 LAB — LIPID PANEL
Cholesterol: 156 mg/dL (ref 0–200)
HDL: 40 mg/dL (ref >39.00)
LDL Cholesterol: 82 mg/dL (ref 0–99)
NonHDL: 116.16
Total CHOL/HDL Ratio: 4
Triglycerides: 172 mg/dL — ABNORMAL HIGH (ref 0.0–149.0)
VLDL: 34.4 mg/dL (ref 0.0–40.0)

## 2019-08-31 LAB — HEMOGLOBIN A1C: Hgb A1c MFr Bld: 5.4 % (ref 4.6–6.5)

## 2019-08-31 NOTE — Progress Notes (Signed)
Chief Complaint  Patient presents with  . Follow-up    rash on lip    Ronnie Payne is a 79 y.o. male here for a skin complaint.  Duration: 1 month Location: lower lip Pruritic? Yes Painful? No Drainage? No New soaps/lotions/topicals/detergents? No Sick contacts? No Other associated symptoms: keeps recurring Therapies tried thus far: Acyclovir helps and then it comes back  yperlipidemia Patient presents for dyslipidemia follow up. Currently being treated with Lipitor 20 mg/d and compliance with treatment thus far has been good. He complains of myalgias. He is adhering to a healthy diet. Exercise: goes to gym The patient is not known to have coexisting coronary artery disease.  Past Medical History:  Diagnosis Date  . Arthritis   . Cold sore   . Frequent headaches   . History of skin cancer   . Hyperlipidemia   . Hypertension   . Prediabetes     BP 118/68 (BP Location: Right Arm, Patient Position: Sitting, Cuff Size: Normal)   Pulse (!) 59   Temp (!) 97 F (36.1 C) (Temporal)   Ht 5\' 4"  (1.626 m)   Wt 147 lb 4 oz (66.8 kg)   SpO2 98%   BMI 25.28 kg/m  Gen: awake, alert, appearing stated age Lungs: No accessory muscle use Skin: no current lesions on face. No drainage, erythema, TTP, fluctuance, excoriation Psych: Age appropriate judgment and insight  Cold sore  Prediabetes - Plan: Hemoglobin A1c  Hyperlipidemia, unspecified hyperlipidemia type - Plan: Lipid panel, Comprehensive metabolic panel  1- gave options of 1% HC cream trial, suppressive therapy or just continue to tx as needed w TID acyclovir.  2- Ck a1c. 3- Cont statin, ck labs.  F/u in 6 weeks to reck skin. The patient voiced understanding and agreement to the plan.  [place], DO 08/31/19 11:15 AM

## 2019-08-31 NOTE — Patient Instructions (Signed)
Use the Acyclovir if you are having a flare.  An option is to take 1 tab twice daily for the next month and we can reconvene.  Another option is to try 1% hydrocortisone cream twice daily over the itchy area of the skin under your lip.  Give [place] 2-3 business days to get the results of your labs back.   Let us know if you need anything.

## 2019-09-09 DIAGNOSIS — M79662 Pain in left lower leg: Secondary | ICD-10-CM | POA: Diagnosis not present

## 2019-09-26 ENCOUNTER — Other Ambulatory Visit: Payer: Self-pay | Admitting: Family Medicine

## 2019-09-26 DIAGNOSIS — I10 Essential (primary) hypertension: Secondary | ICD-10-CM

## 2019-10-09 ENCOUNTER — Ambulatory Visit (INDEPENDENT_AMBULATORY_CARE_PROVIDER_SITE_OTHER): Payer: Medicare Other | Admitting: Family Medicine

## 2019-10-09 ENCOUNTER — Encounter: Payer: Self-pay | Admitting: Family Medicine

## 2019-10-09 ENCOUNTER — Other Ambulatory Visit: Payer: Self-pay

## 2019-10-09 VITALS — BP 122/78 | HR 79 | Temp 97.1°F | Ht 64.0 in | Wt 148.2 lb

## 2019-10-09 DIAGNOSIS — S46811A Strain of other muscles, fascia and tendons at shoulder and upper arm level, right arm, initial encounter: Secondary | ICD-10-CM

## 2019-10-09 DIAGNOSIS — Z66 Do not resuscitate: Secondary | ICD-10-CM | POA: Diagnosis not present

## 2019-10-09 DIAGNOSIS — M545 Low back pain, unspecified: Secondary | ICD-10-CM

## 2019-10-09 DIAGNOSIS — G4700 Insomnia, unspecified: Secondary | ICD-10-CM | POA: Diagnosis not present

## 2019-10-09 MED ORDER — DOXEPIN HCL 10 MG PO CAPS: 10.0000 mg | ORAL_CAPSULE | Freq: Every evening | ORAL | 3 refills | Status: DC | PRN

## 2019-10-09 MED ORDER — CYCLOBENZAPRINE HCL 10 MG PO TABS: 5.0000 mg | ORAL_TABLET | Freq: Three times a day (TID) | ORAL | 0 refills | Status: DC | PRN

## 2019-10-09 MED ORDER — TEMAZEPAM 15 MG PO CAPS: 15.0000 mg | ORAL_CAPSULE | Freq: Every evening | ORAL | 0 refills | Status: DC | PRN

## 2019-10-09 NOTE — Patient Instructions (Signed)
Heat (pad or rice pillow in microwave) over affected area, 10-15 minutes twice daily.   Ice/cold pack over area for 10-15 min twice daily.  Take Flexeril (cyclobenzaprine) 1-2 hours before planned bedtime. If it makes you drowsy, do not take during the day. You can try half a tab the following night.  OK to take Tylenol 1000 mg (2 extra strength tabs) or 975 mg (3 regular strength tabs) every 6 hours as needed.  Trapezius stretches/exercises Do exercises exactly as told by your health care provider and adjust them as directed. It is normal to feel mild stretching, pulling, tightness, or discomfort as you do these exercises, but you should stop right away if you feel sudden pain or your pain gets worse.  Stretching and range of motion exercises These exercises warm up your muscles and joints and improve the movement and flexibility of your shoulder. These exercises can also help to relieve pain, numbness, and tingling. If you are unable to do any of the following for any reason, do not further attempt to do it.   Exercise A: Flexion, standing    1. Stand and hold a broomstick, a cane, or a similar object. Place your hands a little more than shoulder-width apart on the object. Your left / right hand should be palm-up, and your other hand should be palm-down. 2. Push the stick to raise your left / right arm out to your side and then over your head. Use your other hand to help move the stick. Stop when you feel a stretch in your shoulder, or when you reach the angle that is recommended by your health care provider. ? Avoid shrugging your shoulder while you raise your arm. Keep your shoulder blade tucked down toward your spine. 3. Hold for 30 seconds. 4. Slowly return to the starting position. Repeat 2 times. Complete this exercise 3 times per week.  Exercise B: Abduction, supine    1. Lie on your back and hold a broomstick, a cane, or a similar object. Place your hands a little more than  shoulder-width apart on the object. Your left / right hand should be palm-up, and your other hand should be palm-down. 2. Push the stick to raise your left / right arm out to your side and then over your head. Use your other hand to help move the stick. Stop when you feel a stretch in your shoulder, or when you reach the angle that is recommended by your health care provider. ? Avoid shrugging your shoulder while you raise your arm. Keep your shoulder blade tucked down toward your spine. 3. Hold for 30 seconds. 4. Slowly return to the starting position. Repeat 2 times. Complete this exercise 3 times per week.  Exercise C: Flexion, active-assisted    1. Lie on your back. You may bend your knees for comfort. 2. Hold a broomstick, a cane, or a similar object. Place your hands about shoulder-width apart on the object. Your palms should face toward your feet. 3. Raise the stick and move your arms over your head and behind your head, toward the floor. Use your healthy arm to help your left / right arm move farther. Stop when you feel a gentle stretch in your shoulder, or when you reach the angle where your health care provider tells you to stop. 4. Hold for 30 seconds. 5. Slowly return to the starting position. Repeat 2 times. Complete this exercise 3 times per week.  Exercise D: External rotation and abduction  1. Stand in a door frame with one of your feet slightly in front of the other. This is called a staggered stance. 2. Choose one of the following positions as told by your health care provider: ? Place your hands and forearms on the door frame above your head. ? Place your hands and forearms on the door frame at the height of your head. ? Place your hands on the door frame at the height of your elbows. 3. Slowly move your weight onto your front foot until you feel a stretch across your chest and in the front of your shoulders. Keep your head and chest upright and keep your abdominal  muscles tight. 4. Hold for 30 seconds. 5. To release the stretch, shift your weight to your back foot. Repeat 2 times. Complete this stretch 3 times per week.  Strengthening exercises These exercises build strength and endurance in your shoulder. Endurance is the ability to use your muscles for a long time, even after your muscles get tired. Exercise E: Scapular depression and adduction  1. Sit on a stable chair. Support your arms in front of you with pillows, armrests, or a tabletop. Keep your elbows in line with the sides of your body. 2. Gently move your shoulder blades down toward your middle back. Relax the muscles on the tops of your shoulders and in the back of your neck. 3. Hold for 3 seconds. 4. Slowly release the tension and relax your muscles completely before doing this exercise again. Repeat for a total of 10 repetitions. 5. After you have practiced this exercise, try doing the exercise without the arm support. Then, try the exercise while standing instead of sitting. Repeat 2 times. Complete this exercise 3 times per week.  Exercise F: Shoulder abduction, isometric    1. Stand or sit about 4-6 inches (10-15 cm) from a wall with your left / right side facing the wall. 2. Bend your left / right elbow and gently press your elbow against the wall. 3. Increase the pressure slowly until you are pressing as hard as you can without shrugging your shoulder. 4. Hold for 3 seconds. 5. Slowly release the tension and relax your muscles completely. Repeat for a total of 10 repetitions. Repeat 2 times. Complete this exercise 3 times per week.  Exercise G: Shoulder flexion, isometric    1. Stand or sit about 4-6 inches (10-15 cm) away from a wall with your left / right side facing the wall. 2. Keep your left / right elbow straight and gently press the top of your fist against the wall. Increase the pressure slowly until you are pressing as hard as you can without shrugging your  shoulder. 3. Hold for 10-15 seconds. 4. Slowly release the tension and relax your muscles completely. Repeat for a total of 10 repetitions. Repeat 2 times. Complete this exercise 3 times per week.  Exercise H: Internal rotation    1. Sit in a stable chair without armrests, or stand. Secure an exercise band at your left / right side, at elbow height. 2. Place a soft object, such as a folded towel or a small pillow, under your left / right upper arm so your elbow is a few inches (about 8 cm) away from your side. 3. Hold the end of the exercise band so the band stretches. 4. Keeping your elbow pressed against the soft object under your arm, move your forearm across your body toward your abdomen. Keep your body steady so the movement  is only coming from your shoulder. 5. Hold for 3 seconds. 6. Slowly return to the starting position. Repeat for a total of 10 repetitions. Repeat 2 times. Complete this exercise 3 times per week.  Exercise I: External rotation    1. Sit in a stable chair without armrests, or stand. 2. Secure an exercise band at your left / right side, at elbow height. 3. Place a soft object, such as a folded towel or a small pillow, under your left / right upper arm so your elbow is a few inches (about 8 cm) away from your side. 4. Hold the end of the exercise band so the band stretches. 5. Keeping your elbow pressed against the soft object under your arm, move your forearm out, away from your abdomen. Keep your body steady so the movement is only coming from your shoulder. 6. Hold for 3 seconds. 7. Slowly return to the starting position. Repeat for a total of 10 repetitions. Repeat 2 times. Complete this exercise 3 times per week. Exercise J: Shoulder extension  1. Sit in a stable chair without armrests, or stand. Secure an exercise band to a stable object in front of you so the band is at shoulder height. 2. Hold one end of the exercise band in each hand. Your palms should  face each other. 3. Straighten your elbows and lift your hands up to shoulder height. 4. Step back, away from the secured end of the exercise band, until the band stretches. 5. Squeeze your shoulder blades together and pull your hands down to the sides of your thighs. Stop when your hands are straight down by your sides. Do not let your hands go behind your body. 6. Hold for 3 seconds. 7. Slowly return to the starting position. Repeat for a total of 10 repetitions. Repeat 2 times. Complete this exercise 3 times per week.  Exercise K: Shoulder extension, prone    1. Lie on your abdomen on a firm surface so your left / right arm hangs over the edge. 2. Hold a 5 lb weight in your hand so your palm faces in toward your body. Your arm should be straight. 3. Squeeze your shoulder blade down toward the middle of your back. 4. Slowly raise your arm behind you, up to the height of the surface that you are lying on. Keep your arm straight. 5. Hold for 3 seconds. 6. Slowly return to the starting position and relax your muscles. Repeat for a total of 10 repetitions. Repeat 2 times. Complete this exercise 3 times per week.   Exercise L: Horizontal abduction, prone  1. Lie on your abdomen on a firm surface so your left / right arm hangs over the edge. 2. Hold a 5 lb weight in your hand so your palm faces toward your feet. Your arm should be straight. 3. Squeeze your shoulder blade down toward the middle of your back. 4. Bend your elbow so your hand moves up, until your elbow is bent to an "L" shape (90 degrees). With your elbow bent, slowly move your forearm forward and up. Raise your hand up to the height of the surface that you are lying on. ? Your upper arm should not move, and your elbow should stay bent. ? At the top of the movement, your palm should face the floor. 5. Hold for 3 seconds. 6. Slowly return to the starting position and relax your muscles. Repeat for a total of 10  repetitions. Repeat 2 times. Complete this  exercise 3 times per week.  Exercise M: Horizontal abduction, standing  1. Sit on a stable chair, or stand. 2. Secure an exercise band to a stable object in front of you so the band is at shoulder height. 3. Hold one end of the exercise band in each hand. 4. Straighten your elbows and lift your hands straight in front of you, up to shoulder height. Your palms should face down, toward the floor. 5. Step back, away from the secured end of the exercise band, until the band stretches. 6. Move your arms out to your sides, and keep your arms straight. 7. Hold for 3 seconds. 8. Slowly return to the starting position. Repeat for a total of 10 repetitions. Repeat 2 times. Complete this exercise 3 times per week.  Exercise N: Scapular retraction and elevation  1. Sit on a stable chair, or stand. 2. Secure an exercise band to a stable object in front of you so the band is at shoulder height. 3. Hold one end of the exercise band in each hand. Your palms should face each other. 4. Sit in a stable chair without armrests, or stand. 5. Step back, away from the secured end of the exercise band, until the band stretches. 6. Squeeze your shoulder blades together and lift your hands over your head. Keep your elbows straight. 7. Hold for 3 seconds. 8. Slowly return to the starting position. Repeat for a total of 10 repetitions. Repeat 2 times. Complete this exercise 3 times per week.  This information is not intended to replace advice given to you by your health care provider. Make sure you discuss any questions you have with your health care provider. Document Released: 04/23/2005 Document Revised: 12/29/2015 Document Reviewed: 03/10/2015 Elsevier Interactive Patient Education  2017 [employer].

## 2019-10-09 NOTE — Progress Notes (Signed)
Musculoskeletal Exam  Patient: Ronnie Payne DOB: November 26, 1940  DOS: 10/09/2019  SUBJECTIVE:  Chief Complaint:   Chief Complaint  Patient presents with  . Neck Pain  . Back Pain    Ronnie Payne is a 79 y.o.  male for evaluation and treatment of R neck and lower back pain.   Onset:  2 weeks ago. No inj or change in activity.  Location: R side of neck Waxes and wanes Character:  aching  Progression of issue:  is unchanged Associated symptoms: decreased rotation ROM; no swelling, redness, or bruising Treatment: to date: Has not tried anything.   Neurovascular symptoms: no  R lower back pain over past 2 d. Getting much better. No inj or change in activity or urinary complaints. No fevers. Has not tried anything.  Patient was to discuss resuscitation status and iterated that he prefers to be a DNR.  Patient has a history of insomnia.  He takes temazepam very intermittently.  He had a 30-day supply prescribed over 6 months ago and still has some left.  He has failed trazodone.  Never been on doxepin.  He is open to trying something else.  He is not having any adverse effects of his current medication.  Falling asleep is not terribly difficult but he will wake up and have difficulty returning.  Past Medical History:  Diagnosis Date  . Arthritis   . Cold sore   . Frequent headaches   . History of skin cancer   . Hyperlipidemia   . Hypertension   . Prediabetes     Objective: VITAL SIGNS: BP 122/78 (BP Location: Left Arm, Patient Position: Sitting, Cuff Size: Normal)   Pulse 79   Temp (!) 97.1 F (36.2 C) (Temporal)   Ht 5\' 4"  (1.626 m)   Wt 148 lb 4 oz (67.2 kg)   SpO2 98%   BMI 25.45 kg/m  Constitutional: Well formed, well developed. No acute distress. Cardiovascular: Brisk cap refill Thorax & Lungs: No accessory muscle use Musculoskeletal: Neck.   Normal active range of motion: yes.   Normal passive range of motion: yes Tenderness to palpation: Yes over the  right medial trapezius near T1 Deformity: no Ecchymosis: no There is also tenderness to palpation over the right erector spinae muscle group at approximately L3-L5 Tests positive: none Tests negative: Straight leg and Spurling's Neurologic: Normal sensory function.  Psychiatric: Normal mood. Age appropriate judgment and insight. Alert & oriented x 3.    Assessment:  Strain of right trapezius muscle, initial encounter  Acute right-sided low back pain without sciatica  Insomnia, unspecified type - Plan: doxepin (SINEQUAN) 10 MG capsule, temazepam (RESTORIL) 15 MG capsule  DNR (do not resuscitate)  Plan: 1-heat, ice, Tylenol, stretches and exercises, judicious use of a muscle relaxant which she has done well with in the past. 2-reassurance, this is resolving on its own. 3-continue intermittent use of Restoril.  Will trial a low-dose of doxepin.  He will not take this in the temazepam at the same time.  We will also wait until after he is no longer using the muscle relaxants to start.  I would like to see him in 1 month to recheck this. 4- Update DNR status, form filled out and reviewed with pt. To be scanned into chart.  The patient voiced understanding and agreement to the plan.   [place], DO 10/09/19  11:07 AM

## 2019-10-15 NOTE — Progress Notes (Signed)
Subjective:   Ronnie Payne is a 79 y.o. male who presents for Medicare Annual/Subsequent preventive examination.  Review of Systems:  Home Safety/Smoke Alarms: Feels safe in home. Smoke alarms in place.  Lives w/ wife in 2 story home. Does well with stairs.   Male:   CCS-   Last reported 04/2011 PSA- No results found for: PSA     Objective:    Vitals: BP 134/68 (BP Location: Left Arm, Patient Position: Sitting, Cuff Size: Normal)   Pulse 67   Temp (!) 97 F (36.1 C) (Temporal)   Ht 5\' 4"  (1.626 m)   Wt 149 lb 9.6 oz (67.9 kg)   SpO2 97%   BMI 25.68 kg/m   Body mass index is 25.68 kg/m.  Advanced Directives 10/16/2019 09/10/2017 07/11/2017 05/30/2016  Does Patient Have a Medical Advance Directive? Yes Yes Yes Yes  Type of Paramedic of [place];Living will [place];Living will [place];Living will [place];Living will  Does patient want to make changes to medical advance directive? No - Patient declined - No - Patient declined -  Copy of [place] in Chart? No - copy requested No - copy requested No - copy requested -    Tobacco Social History   Tobacco Use  Smoking Status Never Smoker  Smokeless Tobacco Never Used     Counseling given: Not Answered   Clinical Intake: Pain : No/denies pain     Past Medical History:  Diagnosis Date  . Arthritis   . Cold sore   . Frequent headaches   . History of skin cancer   . Hyperlipidemia   . Hypertension   . Prediabetes    Past Surgical History:  Procedure Laterality Date  . INGUINAL HERNIA REPAIR  10/06/2014  . 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 TRANSURETHRAL RESECTION OF PROSTATE  2000-2012   Family History  Problem Relation Age of Onset  . Thyroid cancer Mother    . Emphysema Father   . Lung cancer Sister   . Colon cancer Neg Hx   . Colon polyps Neg Hx   . Esophageal cancer Neg Hx   . Stomach cancer Neg Hx   . Rectal cancer Neg Hx    Social History   Socioeconomic History  . Marital status: Married    Spouse name: Not on file  . Number of children: 1  . Years of education: Not on file  . Highest education level: Not on file  Occupational History  . Occupation: retired    Comment: Optometrist  Tobacco Use  . Smoking status: Never Smoker  . Smokeless tobacco: Never Used  Vaping Use  . Vaping Use: Never used  Substance and Sexual Activity  . Alcohol use: No  . Drug use: No  . Sexual activity: Yes  Other Topics Concern  . Not on file  Social History Narrative  . Not on file   Social Determinants of Health   Financial Resource Strain: Low Risk   . Difficulty of Paying Living Expenses: Not hard at all  Food Insecurity: No Food Insecurity  . Worried About Charity fundraiser in the Last Year: Never true  . Ran Out of Food in the Last Year: Never true  Transportation Needs: No Transportation Needs  . Lack of Transportation (Medical):  No  . Lack of Transportation (Non-Medical): No  Physical Activity:   . Days of Exercise per Week:   . Minutes of Exercise per Session:   Stress:   . Feeling of Stress :   Social Connections:   . Frequency of Communication with Friends and Family:   . Frequency of Social Gatherings with Friends and Family:   . Attends Religious Services:   . Active Member of Clubs or Organizations:   . Attends Archivist Meetings:   Marland Kitchen Marital Status:     Outpatient Encounter Medications as of 10/16/2019  Medication Sig  . atenolol (TENORMIN) 50 MG tablet Take 1 tablet by mouth once daily  . atorvastatin (LIPITOR) 20 MG tablet TAKE 1 TABLET BY MOUTH ONCE DAILY IN THE EVENING  . cholecalciferol (VITAMIN D) 1000 units tablet Take 1,000 Units by mouth daily.  Marland Kitchen doxepin (SINEQUAN) 10 MG capsule Take 1  capsule (10 mg total) by mouth at bedtime as needed.  . Multiple Vitamins-Minerals (OCUVITE PO) Take 1 tablet by mouth daily.  . multivitamin-iron-minerals-folic acid (CENTRUM) chewable tablet Chew 1 tablet by mouth daily.  . temazepam (RESTORIL) 15 MG capsule Take 1 capsule (15 mg total) by mouth at bedtime as needed for sleep.  Marland Kitchen acyclovir (ZOVIRAX) 400 MG tablet Take 3 times daily for 5 days when you have a flare on your lip. (Patient not taking: Reported on 10/16/2019)  . cyclobenzaprine (FLEXERIL) 10 MG tablet Take 0.5-1 tablets (5-10 mg total) by mouth 3 (three) times daily as needed for muscle spasms. (Patient not taking: Reported on 10/16/2019)   No facility-administered encounter medications on file as of 10/16/2019.    Activities of Daily Living In your present state of health, do you have any difficulty performing the following activities: 10/16/2019  Hearing? N  Vision? N  Difficulty concentrating or making decisions? N  Walking or climbing stairs? N  Dressing or bathing? N  Doing errands, shopping? N  Preparing Food and eating ? N  Using the Toilet? N  In the past six months, have you accidently leaked urine? N  Do you have problems with loss of bowel control? N  Managing your Medications? N  Managing your Finances? N  Housekeeping or managing your Housekeeping? N  Some recent data might be hidden    Patient Care Team: Shelda Pal, DO as PCP - General (Family Medicine)   Assessment:   This is a routine wellness examination for [facility]. Physical assessment deferred to PCP.  Exercise Activities and Dietary recommendations Current Exercise Habits: Home exercise routine, Type of exercise: stretching;walking;treadmill;strength training/weights, Time (Minutes): 60, Frequency (Times/Week): 3, Weekly Exercise (Minutes/Week): 180, Intensity: Moderate, Exercise limited by: None identified Diet (meal preparation, eat out, water intake, caffeinated beverages, dairy  products, fruits and vegetables): well balanced   Goals    . Continue exercising.       Fall Risk Fall Risk  10/16/2019 12/05/2018 07/11/2017 04/25/2016  Falls in the past year? 0 0 No No  Comment - Emmi Telephone Survey: data to providers prior to load - -  Number falls in past yr: 0 - - -  Injury with Fall? 0 - - -  Follow up Education provided;Falls prevention discussed - - -   Depression Screen PHQ 2/9 Scores 10/16/2019 07/11/2017 04/25/2016  PHQ - 2 Score 0 0 0    Cognitive Function Ad8 score reviewed for issues:  Issues making decisions:no  Less interest in hobbies / activities:no  Repeats questions, stories (family complaining):no  Trouble using ordinary gadgets (microwave, computer, phone):no  Forgets the month or year: no  Mismanaging finances: no  Remembering appts:no  Daily problems with thinking and/or memory:no Ad8 score is= 0     MMSE - Mini Mental State Exam 07/11/2017  Orientation to time 5  Orientation to Place 5  Registration 3  Attention/ Calculation 5  Recall 2  Language- name 2 objects 2  Language- repeat 1  Language- follow 3 step command 3  Language- read & follow direction 1  Write a sentence 1  Copy design 1  Total score 29        Immunization History  Administered Date(s) Administered  . Influenza, High Dose Seasonal PF 01/06/2016, 02/19/2017, 02/03/2018  . PFIZER SARS-COV-2 Vaccination 07/15/2019, 08/11/2019  . Pneumococcal Polysaccharide-23 02/19/2017  . Pneumococcal-Unspecified 02/05/2015  . Tdap 09/22/2018   Screening Tests Health Maintenance  Topic Date Due  . Hepatitis C Screening  Never done  . PNA vac Low Risk Adult (2 of 2 - PCV13) 10/11/2020 (Originally 02/19/2018)  . INFLUENZA VACCINE  12/06/2019  . TETANUS/TDAP  09/21/2028  . COVID-19 Vaccine  Completed     Plan:    See you next year!  Keep up the great work!  You may view your AVS in MyChart as requested instead of printout.   I have personally reviewed  and noted the following in the patient's chart:   . Medical and social history . Use of alcohol, tobacco or illicit drugs  . Current medications and supplements . Functional ability and status . Nutritional status . Physical activity . Advanced directives . List of other physicians . Hospitalizations, surgeries, and ER visits in previous 12 months . Vitals . Screenings to include cognitive, depression, and falls . Referrals and appointments  In addition, I have reviewed and discussed with patient certain preventive protocols, quality metrics, and best practice recommendations. A written personalized care plan for preventive services as well as general preventive health recommendations were provided to patient.     Shela Nevin, [place]  10/16/2019

## 2019-10-16 ENCOUNTER — Encounter: Payer: Self-pay | Admitting: *Deleted

## 2019-10-16 ENCOUNTER — Other Ambulatory Visit: Payer: Self-pay

## 2019-10-16 ENCOUNTER — Ambulatory Visit (INDEPENDENT_AMBULATORY_CARE_PROVIDER_SITE_OTHER): Payer: Medicare Other | Admitting: *Deleted

## 2019-10-16 VITALS — BP 134/68 | HR 67 | Temp 97.0°F | Ht 64.0 in | Wt 149.6 lb

## 2019-10-16 DIAGNOSIS — Z0000 Encounter for general adult medical examination without abnormal findings: Secondary | ICD-10-CM | POA: Diagnosis not present

## 2019-10-16 NOTE — Patient Instructions (Signed)
See you next year!  Keep up the great work!   [name] , Thank you for taking time to come for your Medicare Wellness Visit. I appreciate your ongoing commitment to your health goals. Please review the following plan we discussed and let me know if I can assist you in the future.   These are the goals we discussed: Goals     Continue exercising.       This is a list of the screening recommended for you and due dates:  Health Maintenance  Topic Date Due    Hepatitis C: One time screening is recommended by Center for Disease Control  (CDC) for  adults born from [birth year] through 1965.   Never done   Pneumonia vaccines (2 of 2 - PCV13) 10/11/2020*   Flu Shot  12/06/2019   Tetanus Vaccine  09/21/2028   COVID-19 Vaccine  Completed  *Topic was postponed. The date shown is not the original due date.    Preventive Care 79 Years and Older, Male Preventive care refers to lifestyle choices and visits with your health care provider that can promote health and wellness. This includes:  A yearly physical exam. This is also called an annual well check.  Regular dental and eye exams.  Immunizations.  Screening for certain conditions.  Healthy lifestyle choices, such as diet and exercise. What can I expect for my preventive care visit? Physical exam Your health care provider will check:  Height and weight. These may be used to calculate body mass index (BMI), which is a measurement that tells if you are at a healthy weight.  Heart rate and blood pressure.  Your skin for abnormal spots. Counseling Your health care provider may ask you questions about:  Alcohol, tobacco, and drug use.  Emotional well-being.  Home and relationship well-being.  Sexual activity.  Eating habits.  History of falls.  Memory and ability to understand (cognition).  Work and work Statistician. What immunizations do I need?  Influenza (flu) vaccine  This is recommended every  year. Tetanus, diphtheria, and pertussis (Tdap) vaccine  You may need a Td booster every 10 years. Varicella (chickenpox) vaccine  You may need this vaccine if you have not already been vaccinated. Zoster (shingles) vaccine  You may need this after age 29. Pneumococcal conjugate (PCV13) vaccine  One dose is recommended after age 27. Pneumococcal polysaccharide (PPSV23) vaccine  One dose is recommended after age 56. Measles, mumps, and rubella (MMR) vaccine  You may need at least one dose of MMR if you were born in 1957 or later. You may also need a second dose. Meningococcal conjugate (MenACWY) vaccine  You may need this if you have certain conditions. Hepatitis A vaccine  You may need this if you have certain conditions or if you travel or work in places where you may be exposed to hepatitis A. Hepatitis B vaccine  You may need this if you have certain conditions or if you travel or work in places where you may be exposed to hepatitis B. Haemophilus influenzae type b (Hib) vaccine  You may need this if you have certain conditions. You may receive vaccines as individual doses or as more than one vaccine together in one shot (combination vaccines). Talk with your health care provider about the risks and benefits of combination vaccines. What tests do I need? Blood tests  Lipid and cholesterol levels. These may be checked every 5 years, or more frequently depending on your overall health.  Hepatitis C test.  Hepatitis B test. Screening  Lung cancer screening. You may have this screening every year starting at age 51 if you have a 30-pack-year history of smoking and currently smoke or have quit within the past 15 years.  Colorectal cancer screening. All adults should have this screening starting at age 47 and continuing until age 3. Your health care provider may recommend screening at age 65 if you are at increased risk. You will have tests every 1-10 years, depending on  your results and the type of screening test.  Prostate cancer screening. Recommendations will vary depending on your family history and other risks.  Diabetes screening. This is done by checking your blood sugar (glucose) after you have not eaten for a while (fasting). You may have this done every 1-3 years.  Abdominal aortic aneurysm (AAA) screening. You may need this if you are a current or former smoker.  Sexually transmitted disease (STD) testing. Follow these instructions at home: Eating and drinking  Eat a diet that includes fresh fruits and vegetables, whole grains, lean protein, and low-fat dairy products. Limit your intake of foods with high amounts of sugar, saturated fats, and salt.  Take vitamin and mineral supplements as recommended by your health care provider.  Do not drink alcohol if your health care provider tells you not to drink.  If you drink alcohol: ? Limit how much you have to 0-2 drinks a day. ? Be aware of how much alcohol is in your drink. In the [place] one drink equals one 12 oz bottle of beer (355 mL), one 5 oz glass of wine (148 mL), or one 1 oz glass of hard liquor (44 mL). Lifestyle  Take daily care of your teeth and gums.  Stay active. Exercise for at least 30 minutes on 5 or more days each week.  Do not use any products that contain nicotine or tobacco, such as cigarettes, e-cigarettes, and chewing tobacco. If you need help quitting, ask your health care provider.  If you are sexually active, practice safe sex. Use a condom or other form of protection to prevent STIs (sexually transmitted infections).  Talk with your health care provider about taking a low-dose aspirin or statin. What's next?  Visit your health care provider once a year for a well check visit.  Ask your health care provider how often you should have your eyes and teeth checked.  Stay up to date on all vaccines. This information is not intended to replace advice given to you by  your health care provider. Make sure you discuss any questions you have with your health care provider. Document Revised: 04/17/2018 Document Reviewed: 04/17/2018 Elsevier Patient Education  2020 [employer].

## 2019-10-21 ENCOUNTER — Other Ambulatory Visit: Payer: Self-pay | Admitting: Family Medicine

## 2019-10-21 MED ORDER — ATORVASTATIN CALCIUM 20 MG PO TABS: 20.0000 mg | ORAL_TABLET | Freq: Every evening | ORAL | 1 refills | Status: DC

## 2019-10-22 ENCOUNTER — Other Ambulatory Visit: Payer: Self-pay | Admitting: Family Medicine

## 2019-11-12 DIAGNOSIS — L905 Scar conditions and fibrosis of skin: Secondary | ICD-10-CM | POA: Diagnosis not present

## 2019-11-12 DIAGNOSIS — D485 Neoplasm of uncertain behavior of skin: Secondary | ICD-10-CM | POA: Diagnosis not present

## 2019-11-12 DIAGNOSIS — D225 Melanocytic nevi of trunk: Secondary | ICD-10-CM | POA: Diagnosis not present

## 2019-11-12 DIAGNOSIS — B353 Tinea pedis: Secondary | ICD-10-CM | POA: Diagnosis not present

## 2019-11-12 DIAGNOSIS — L820 Inflamed seborrheic keratosis: Secondary | ICD-10-CM | POA: Diagnosis not present

## 2019-11-12 DIAGNOSIS — Z85820 Personal history of malignant melanoma of skin: Secondary | ICD-10-CM | POA: Diagnosis not present

## 2019-11-12 DIAGNOSIS — Z85828 Personal history of other malignant neoplasm of skin: Secondary | ICD-10-CM | POA: Diagnosis not present

## 2019-11-24 DIAGNOSIS — R1084 Generalized abdominal pain: Secondary | ICD-10-CM | POA: Diagnosis not present

## 2019-11-24 DIAGNOSIS — M5137 Other intervertebral disc degeneration, lumbosacral region: Secondary | ICD-10-CM | POA: Diagnosis not present

## 2019-11-24 DIAGNOSIS — M1611 Unilateral primary osteoarthritis, right hip: Secondary | ICD-10-CM | POA: Diagnosis not present

## 2019-11-24 DIAGNOSIS — I700 Atherosclerosis of aorta: Secondary | ICD-10-CM | POA: Diagnosis not present

## 2019-11-24 DIAGNOSIS — K7689 Other specified diseases of liver: Secondary | ICD-10-CM | POA: Diagnosis not present

## 2019-12-07 DIAGNOSIS — M25561 Pain in right knee: Secondary | ICD-10-CM | POA: Diagnosis not present

## 2019-12-14 ENCOUNTER — Other Ambulatory Visit: Payer: Self-pay | Admitting: Family Medicine

## 2019-12-14 DIAGNOSIS — B001 Herpesviral vesicular dermatitis: Secondary | ICD-10-CM

## 2019-12-23 ENCOUNTER — Other Ambulatory Visit: Payer: Self-pay | Admitting: Family Medicine

## 2019-12-23 DIAGNOSIS — I10 Essential (primary) hypertension: Secondary | ICD-10-CM

## 2020-01-06 DIAGNOSIS — M25561 Pain in right knee: Secondary | ICD-10-CM | POA: Diagnosis not present

## 2020-03-03 DIAGNOSIS — M545 Low back pain, unspecified: Secondary | ICD-10-CM | POA: Diagnosis not present

## 2020-03-03 DIAGNOSIS — S39012A Strain of muscle, fascia and tendon of lower back, initial encounter: Secondary | ICD-10-CM | POA: Diagnosis not present

## 2020-03-25 ENCOUNTER — Other Ambulatory Visit: Payer: Self-pay | Admitting: Family Medicine

## 2020-03-25 DIAGNOSIS — R310 Gross hematuria: Secondary | ICD-10-CM | POA: Diagnosis not present

## 2020-03-25 DIAGNOSIS — N401 Enlarged prostate with lower urinary tract symptoms: Secondary | ICD-10-CM | POA: Diagnosis not present

## 2020-03-25 DIAGNOSIS — R351 Nocturia: Secondary | ICD-10-CM | POA: Diagnosis not present

## 2020-03-25 DIAGNOSIS — I10 Essential (primary) hypertension: Secondary | ICD-10-CM

## 2020-03-28 ENCOUNTER — Other Ambulatory Visit: Payer: Self-pay | Admitting: Family Medicine

## 2020-03-28 DIAGNOSIS — I10 Essential (primary) hypertension: Secondary | ICD-10-CM

## 2020-03-28 MED ORDER — ATENOLOL 50 MG PO TABS: 50.0000 mg | ORAL_TABLET | Freq: Every day | ORAL | 0 refills | Status: DC

## 2020-04-15 DIAGNOSIS — D2272 Melanocytic nevi of left lower limb, including hip: Secondary | ICD-10-CM | POA: Diagnosis not present

## 2020-04-15 DIAGNOSIS — D1801 Hemangioma of skin and subcutaneous tissue: Secondary | ICD-10-CM | POA: Diagnosis not present

## 2020-04-15 DIAGNOSIS — B356 Tinea cruris: Secondary | ICD-10-CM | POA: Diagnosis not present

## 2020-04-15 DIAGNOSIS — D225 Melanocytic nevi of trunk: Secondary | ICD-10-CM | POA: Diagnosis not present

## 2020-04-15 DIAGNOSIS — R21 Rash and other nonspecific skin eruption: Secondary | ICD-10-CM | POA: Diagnosis not present

## 2020-04-15 DIAGNOSIS — B353 Tinea pedis: Secondary | ICD-10-CM | POA: Diagnosis not present

## 2020-04-15 DIAGNOSIS — D229 Melanocytic nevi, unspecified: Secondary | ICD-10-CM | POA: Diagnosis not present

## 2020-04-15 DIAGNOSIS — L570 Actinic keratosis: Secondary | ICD-10-CM | POA: Diagnosis not present

## 2020-04-15 DIAGNOSIS — L910 Hypertrophic scar: Secondary | ICD-10-CM | POA: Diagnosis not present

## 2020-04-15 DIAGNOSIS — Z85828 Personal history of other malignant neoplasm of skin: Secondary | ICD-10-CM | POA: Diagnosis not present

## 2020-04-15 DIAGNOSIS — L905 Scar conditions and fibrosis of skin: Secondary | ICD-10-CM | POA: Diagnosis not present

## 2020-04-15 DIAGNOSIS — D485 Neoplasm of uncertain behavior of skin: Secondary | ICD-10-CM | POA: Diagnosis not present

## 2020-05-05 ENCOUNTER — Telehealth: Payer: Medicare Other | Admitting: Family Medicine

## 2020-05-07 ENCOUNTER — Other Ambulatory Visit: Payer: Self-pay | Admitting: Family Medicine

## 2020-05-19 DIAGNOSIS — M25561 Pain in right knee: Secondary | ICD-10-CM | POA: Diagnosis not present

## 2020-05-19 DIAGNOSIS — M1711 Unilateral primary osteoarthritis, right knee: Secondary | ICD-10-CM | POA: Diagnosis not present

## 2020-05-27 DIAGNOSIS — R1310 Dysphagia, unspecified: Secondary | ICD-10-CM | POA: Diagnosis not present

## 2020-05-27 DIAGNOSIS — H93A1 Pulsatile tinnitus, right ear: Secondary | ICD-10-CM | POA: Diagnosis not present

## 2020-05-27 DIAGNOSIS — H6121 Impacted cerumen, right ear: Secondary | ICD-10-CM | POA: Diagnosis not present

## 2020-06-06 DIAGNOSIS — R310 Gross hematuria: Secondary | ICD-10-CM | POA: Diagnosis not present

## 2020-06-21 DIAGNOSIS — M25561 Pain in right knee: Secondary | ICD-10-CM | POA: Diagnosis not present

## 2020-06-23 ENCOUNTER — Other Ambulatory Visit: Payer: Self-pay | Admitting: Family Medicine

## 2020-06-23 DIAGNOSIS — I10 Essential (primary) hypertension: Secondary | ICD-10-CM

## 2020-06-28 ENCOUNTER — Other Ambulatory Visit: Payer: Self-pay | Admitting: Family Medicine

## 2020-06-28 DIAGNOSIS — G4700 Insomnia, unspecified: Secondary | ICD-10-CM

## 2020-06-30 DIAGNOSIS — M25561 Pain in right knee: Secondary | ICD-10-CM | POA: Diagnosis not present

## 2020-07-05 DIAGNOSIS — M1711 Unilateral primary osteoarthritis, right knee: Secondary | ICD-10-CM | POA: Diagnosis not present

## 2020-07-05 DIAGNOSIS — M25561 Pain in right knee: Secondary | ICD-10-CM | POA: Diagnosis not present

## 2020-07-06 ENCOUNTER — Telehealth: Payer: Self-pay | Admitting: Family Medicine

## 2020-07-06 NOTE — Telephone Encounter (Signed)
LVM AWV appt change to 10/20/20 @3 :20pm w/Martha Stanley.  Please verify.

## 2020-07-19 DIAGNOSIS — H93A1 Pulsatile tinnitus, right ear: Secondary | ICD-10-CM | POA: Diagnosis not present

## 2020-07-19 DIAGNOSIS — H903 Sensorineural hearing loss, bilateral: Secondary | ICD-10-CM | POA: Diagnosis not present

## 2020-07-20 DIAGNOSIS — S83231A Complex tear of medial meniscus, current injury, right knee, initial encounter: Secondary | ICD-10-CM | POA: Diagnosis not present

## 2020-07-20 DIAGNOSIS — S83271A Complex tear of lateral meniscus, current injury, right knee, initial encounter: Secondary | ICD-10-CM | POA: Diagnosis not present

## 2020-07-20 DIAGNOSIS — X58XXXA Exposure to other specified factors, initial encounter: Secondary | ICD-10-CM | POA: Diagnosis not present

## 2020-07-20 DIAGNOSIS — G8918 Other acute postprocedural pain: Secondary | ICD-10-CM | POA: Diagnosis not present

## 2020-07-20 DIAGNOSIS — M94261 Chondromalacia, right knee: Secondary | ICD-10-CM | POA: Diagnosis not present

## 2020-07-20 DIAGNOSIS — Y999 Unspecified external cause status: Secondary | ICD-10-CM | POA: Diagnosis not present

## 2020-07-20 DIAGNOSIS — M6751 Plica syndrome, right knee: Secondary | ICD-10-CM | POA: Diagnosis not present

## 2020-08-05 ENCOUNTER — Other Ambulatory Visit: Payer: Self-pay | Admitting: Family Medicine

## 2020-08-15 ENCOUNTER — Other Ambulatory Visit: Payer: Self-pay | Admitting: Otolaryngology

## 2020-08-15 ENCOUNTER — Other Ambulatory Visit: Payer: Self-pay | Admitting: Family Medicine

## 2020-08-15 DIAGNOSIS — R7303 Prediabetes: Secondary | ICD-10-CM

## 2020-08-15 DIAGNOSIS — I10 Essential (primary) hypertension: Secondary | ICD-10-CM

## 2020-08-15 DIAGNOSIS — E785 Hyperlipidemia, unspecified: Secondary | ICD-10-CM

## 2020-08-15 DIAGNOSIS — H93A1 Pulsatile tinnitus, right ear: Secondary | ICD-10-CM

## 2020-08-16 DIAGNOSIS — M25661 Stiffness of right knee, not elsewhere classified: Secondary | ICD-10-CM | POA: Diagnosis not present

## 2020-08-16 DIAGNOSIS — M6281 Muscle weakness (generalized): Secondary | ICD-10-CM | POA: Diagnosis not present

## 2020-08-17 ENCOUNTER — Other Ambulatory Visit: Payer: Self-pay

## 2020-08-17 ENCOUNTER — Ambulatory Visit
Admission: RE | Admit: 2020-08-17 | Discharge: 2020-08-17 | Disposition: A | Discharge status: Home / Self Care | Payer: Medicare Other | Source: Ambulatory Visit | Attending: Otolaryngology | Admitting: Otolaryngology

## 2020-08-17 DIAGNOSIS — H93A1 Pulsatile tinnitus, right ear: Secondary | ICD-10-CM | POA: Diagnosis not present

## 2020-08-17 IMAGING — MR MR MRA HEAD W/O CM
1 series · 23 of 48 positions shown · non-contrast
Comparison: None.

CLINICAL DATA: Right-sided pulsatile tinnitus.

EXAM:
MRA HEAD WITHOUT CONTRAST
TECHNIQUE: Angiographic images of the Circle of Willis were obtained using MRA
technique without intravenous contrast.

[Series 3: tof_3d_multi-slab new · axial · 0.7mm · 0.35mm/px · z∈[-54,+73]mm · 23 of 195 slices shown]
[im 1/195]
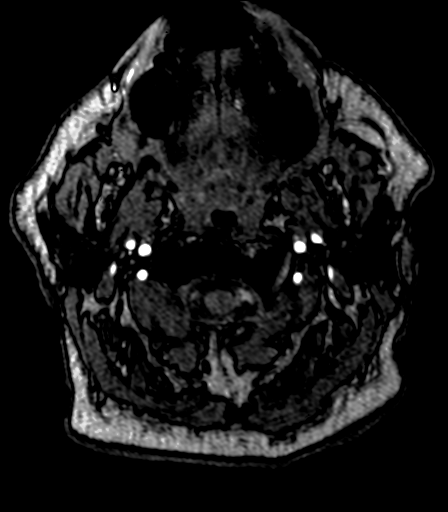
[im 5/195]
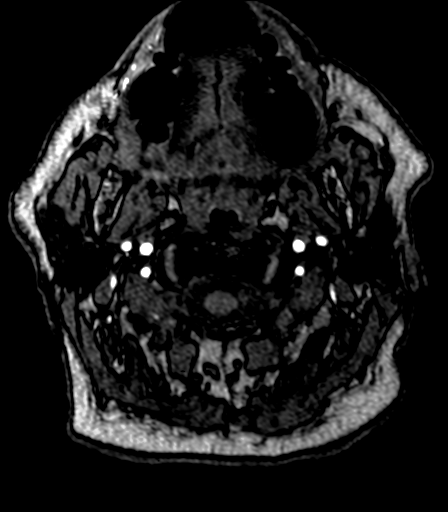
[im 9/195]
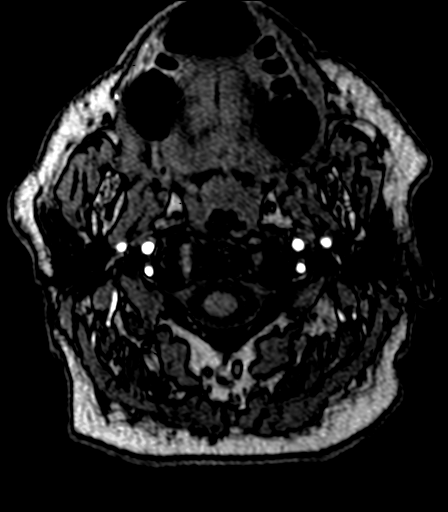
[im 13/195]
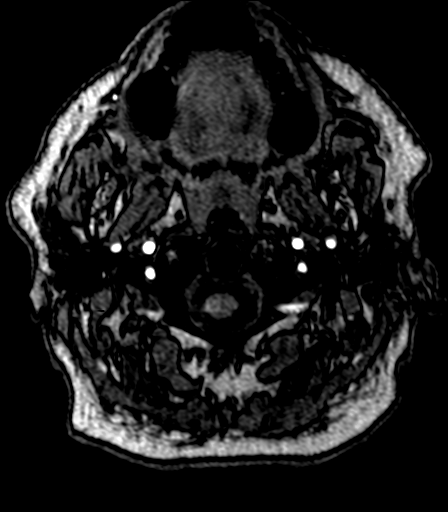
[im 17/195]
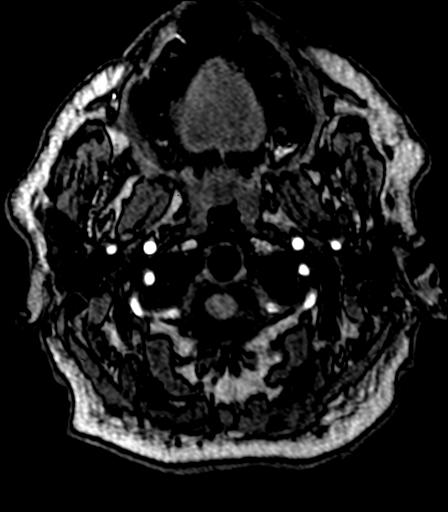
[im 21/195]
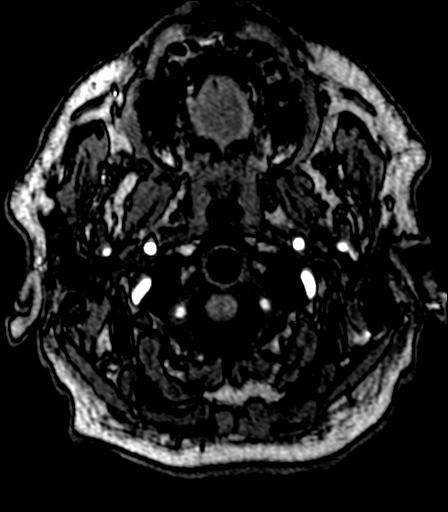
[im 25/195]
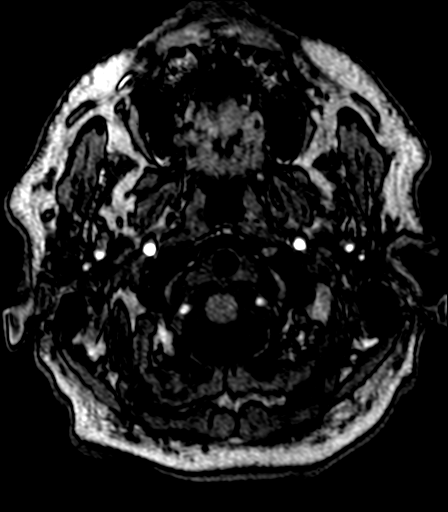
[im 29/195]
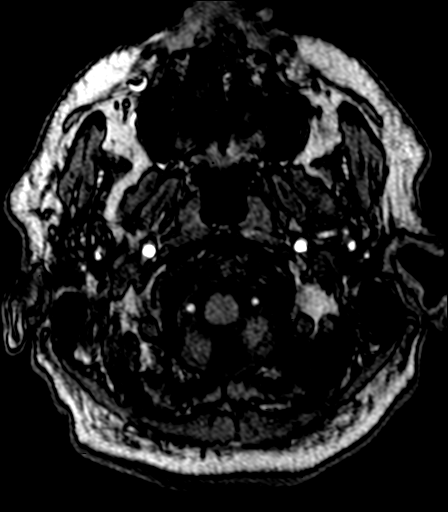
[im 34/195]
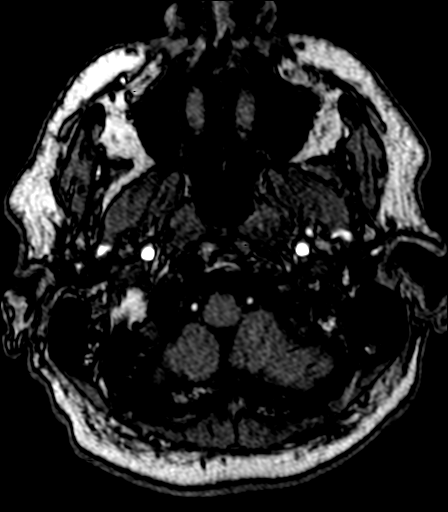
[im 38/195]
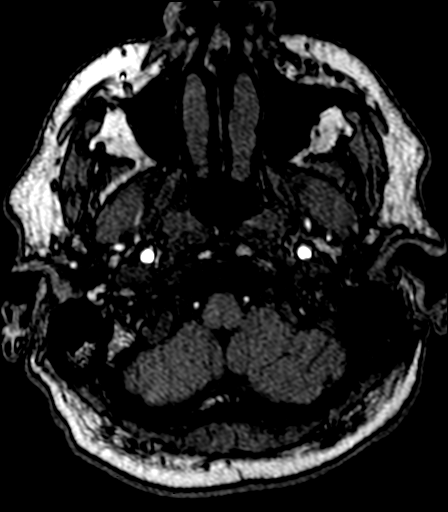
[im 42/195]
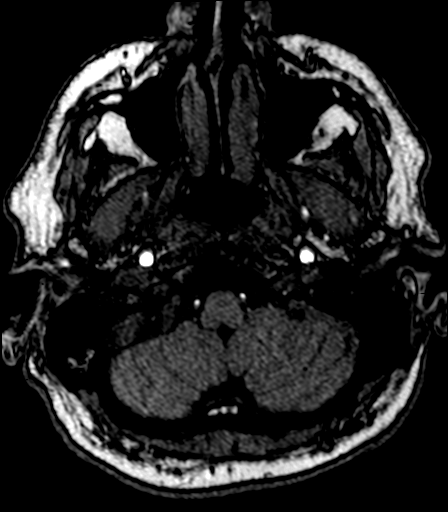
[im 46/195]
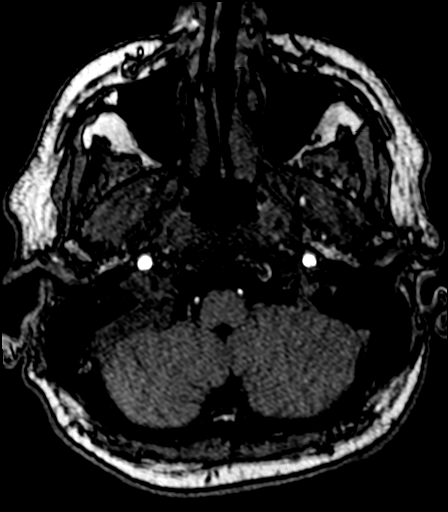
[im 50/195]
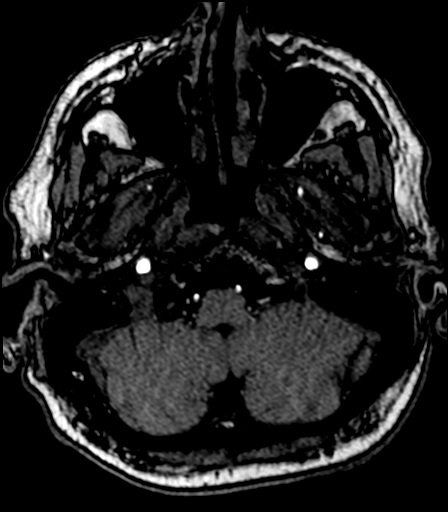
[im 54/195]
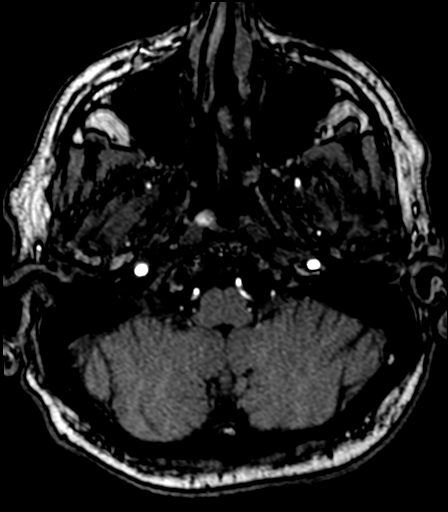
[im 58/195]
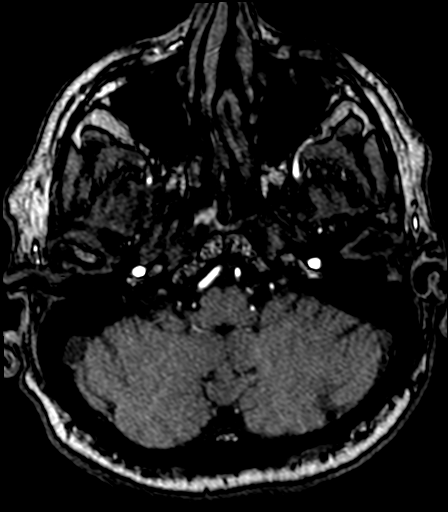
[im 62/195]
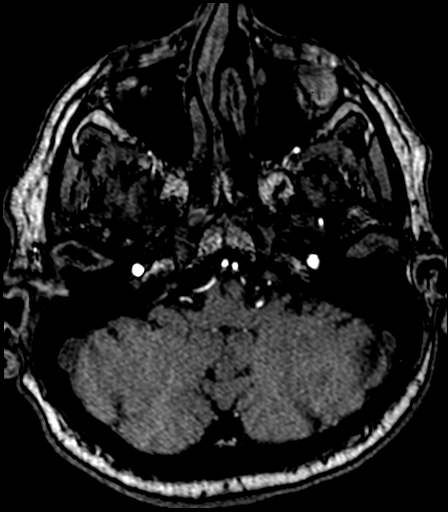
[im 87/195]
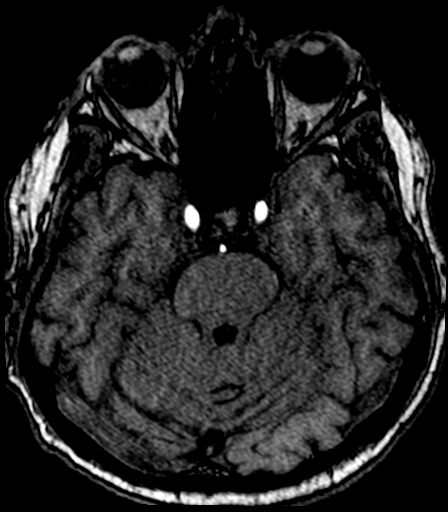
[im 100/195]
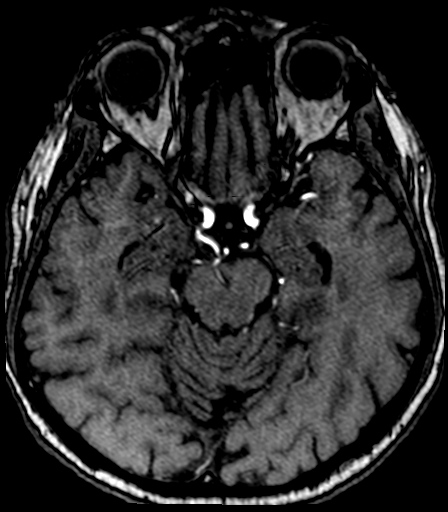
[im 112/195]
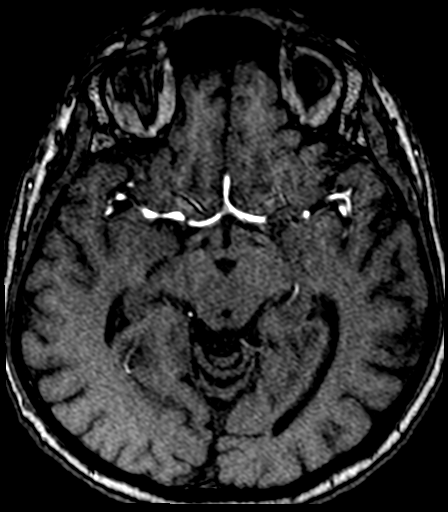
[im 137/195]
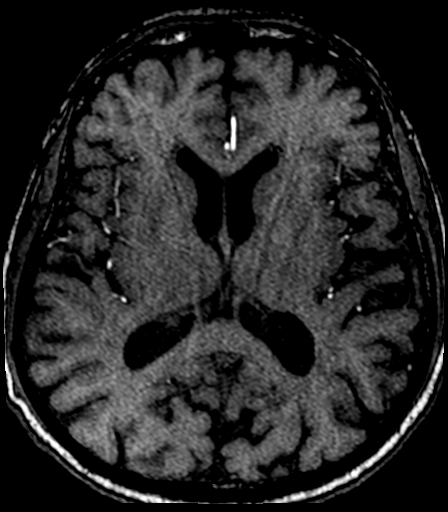
[im 161/195]
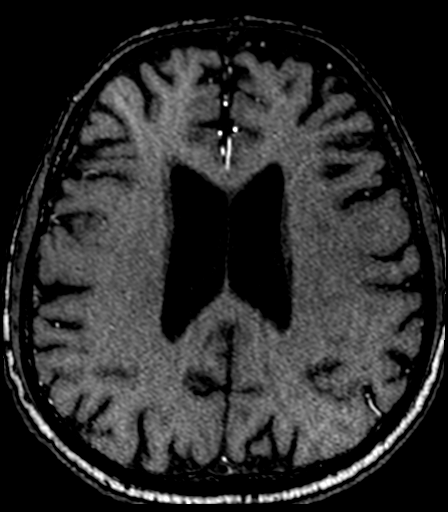
[im 166/195]
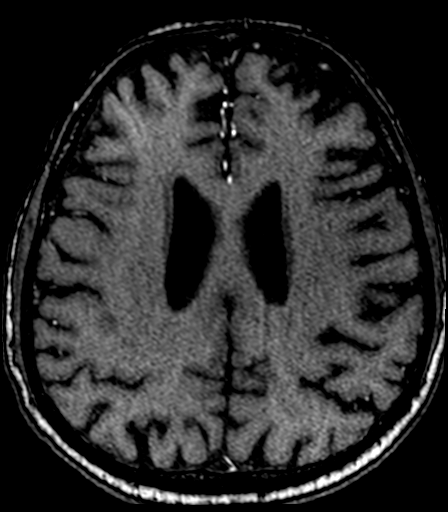
[im 186/195]
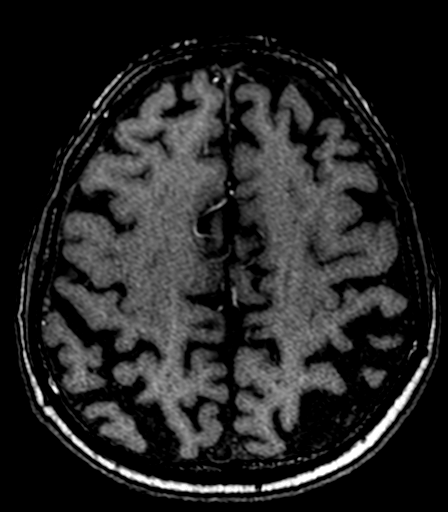

[23 of 48 positions shown; findings below may reference images not displayed]

FINDINGS: The visualized distal vertebral arteries are widely patent to the
basilar and codominant. Patent PICAs, AICAs, and SCAs are seen
bilaterally. The basilar artery is widely patent. Both PCAs are
patent without evidence of a significant proximal stenosis. There is
a large right posterior communicating artery with hypoplastic P1
segment, and there is also an early bifurcation of the right PCA,
both normal variants.

The internal carotid arteries are widely patent from skull base to
carotid termini. ACAs and MCAs are patent without evidence of a
proximal branch occlusion or significant proximal stenosis. No
aneurysm or vascular malformation is identified.
IMPRESSION: Negative head MRA.

## 2020-08-18 ENCOUNTER — Ambulatory Visit
Admission: RE | Admit: 2020-08-18 | Discharge: 2020-08-18 | Disposition: A | Discharge status: Home / Self Care | Payer: Medicare Other | Source: Ambulatory Visit | Attending: Otolaryngology | Admitting: Otolaryngology

## 2020-08-18 DIAGNOSIS — H93A1 Pulsatile tinnitus, right ear: Secondary | ICD-10-CM

## 2020-08-18 DIAGNOSIS — H93A9 Pulsatile tinnitus, unspecified ear: Secondary | ICD-10-CM | POA: Diagnosis not present

## 2020-08-18 IMAGING — MR MR HEAD WO/W CM
13 series · 48 of 48 positions shown · IV contrast (15 mL Multihance)
Comparison: None.

CLINICAL DATA: Pulsatile tinnitus.

EXAM:
MRI HEAD WITH CONTRAST
MRA NECK WITHOUT AND WITH CONTRAST
TECHNIQUE: Multiplanar, multiecho pulse sequences of the brain and surrounding
structures were obtained with intravenous contrast. Angiographic
images of the neck were obtained using MRA technique with and
without intravenous contrast. Carotid stenosis measurements (when
applicable) are obtained utilizing NASCET criteria, using the distal
internal carotid diameter as the denominator.
CONTRAST:  13mL MULTIHANCE GADOBENATE DIMEGLUMINE 529 MG/ML IV SOLN

[Series 2: t1_se_sag · sagittal · 5.0mm · 0.45mm/px · 1 of 22 slices shown]
[im 1/22]
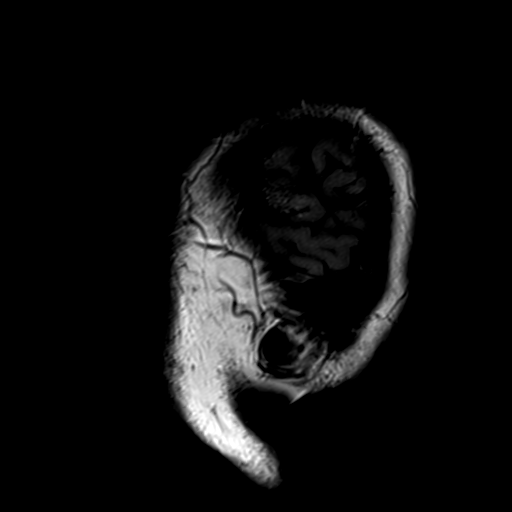

[Series 3: ax ep2d_diff_3 · axial · 3.0mm · 1.80mm/px · z∈[+1,+147]mm · 5 of 101 slices shown]
[im 1/101]
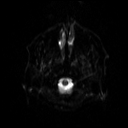
[im 26/101]
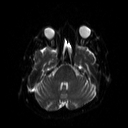
[im 51/101]
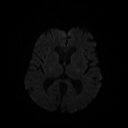
[im 76/101]
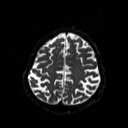
[im 101/101]
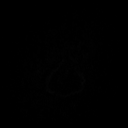

[Series 4: ax ep2d_diff_3_adc · axial · 3.0mm · 1.80mm/px · z∈[+1,+147]mm · 3 of 49 slices shown]
[im 1/49]
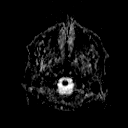
[im 25/49]
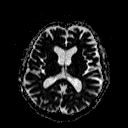
[im 49/49]
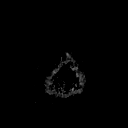

[Series 5: cor ep2d_diff · coronal · 5.0mm · 1.77mm/px · 3 of 54 slices shown]
[im 1/54]
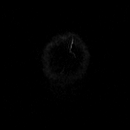
[im 27/54]
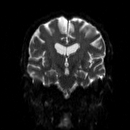
[im 54/54]
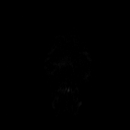

[Series 6: cor ep2d_diff_adc · coronal · 5.0mm · 1.77mm/px · 2 of 27 slices shown]
[im 1/27]
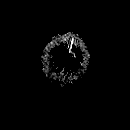
[im 27/27]
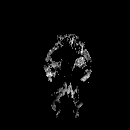

[Series 7: mip_images(sw) · axial · 16.0mm · 0.90mm/px · z∈[+1,+141]mm · 4 of 73 slices shown]
[im 1/73]
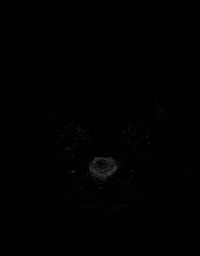
[im 25/73]
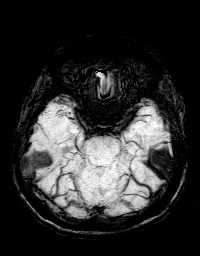
[im 49/73]
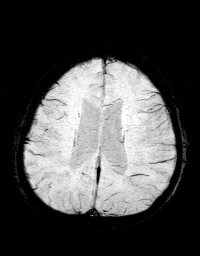
[im 73/73]
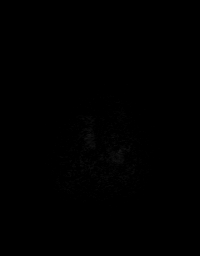

[Series 8: swi_images · axial · 2.0mm · 0.90mm/px · z∈[-6,+148]mm · 5 of 80 slices shown]
[im 1/80]
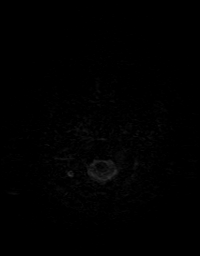
[im 20/80]
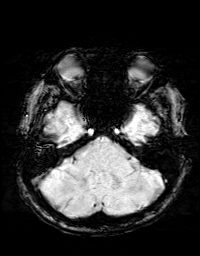
[im 40/80]
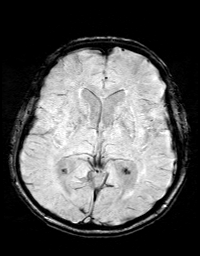
[im 60/80]
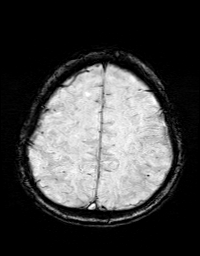
[im 80/80]
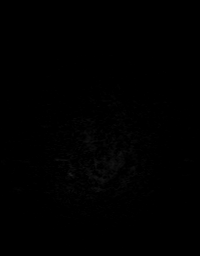

[Series 9: FLAIR · axial · 3.0mm · 0.43mm/px · z∈[-2,+146]mm · 2 of 40 slices shown]
[im 1/40]
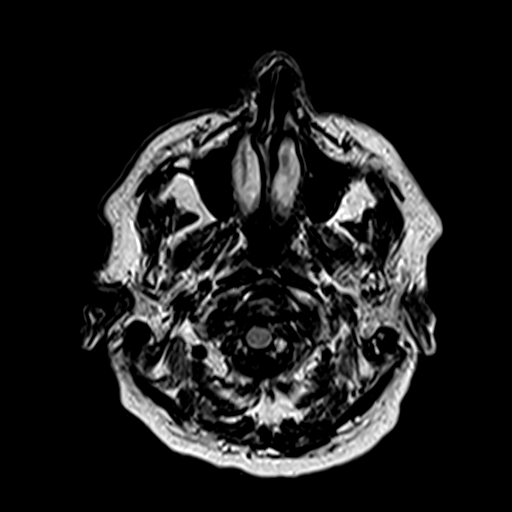
[im 40/40]
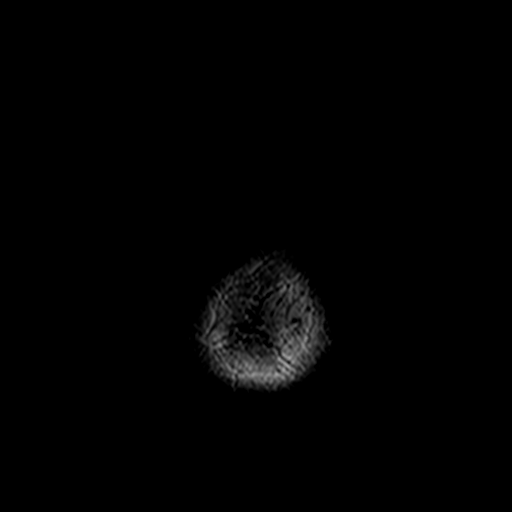

[Series 10: T2 · axial · 5.0mm · 0.60mm/px · 1 of 24 slices shown (1 of 2)]
[im 1/24]
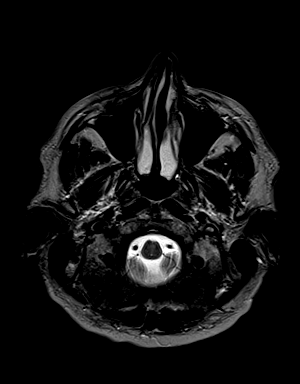

[Series 11: T2 · coronal · 5.0mm · 0.45mm/px · 2 of 27 slices shown (2 of 2)]
[im 1/27]
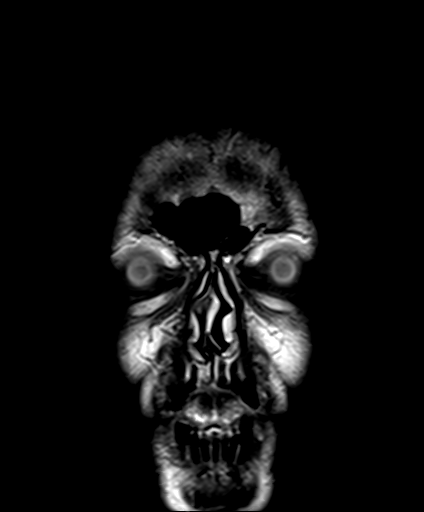
[im 27/27]
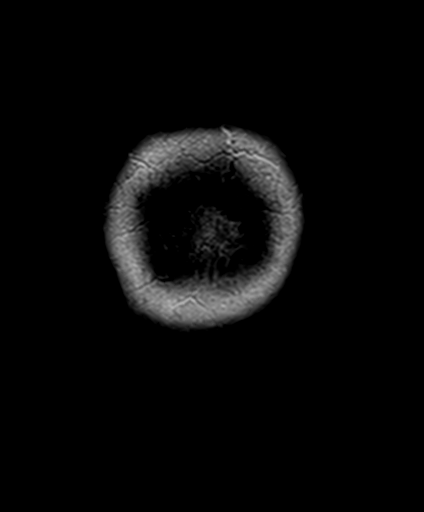

[Series 12: t1_mpr_tra · axial · 1.0mm · 0.72mm/px · z∈[-6,+149]mm · 9 of 160 slices shown]
[im 1/160]
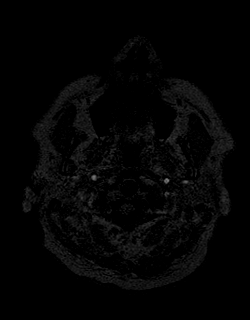
[im 20/160]
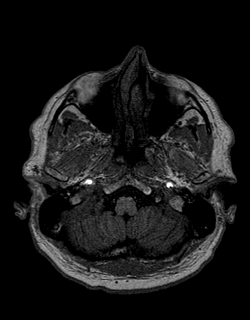
[im 40/160]
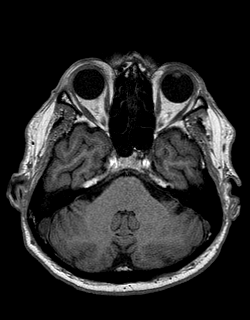
[im 60/160]
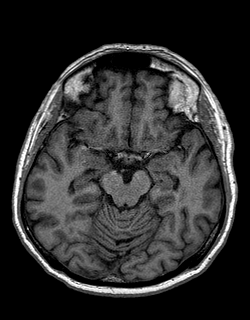
[im 80/160]
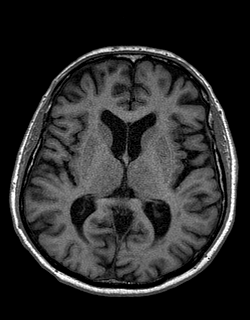
[im 100/160]
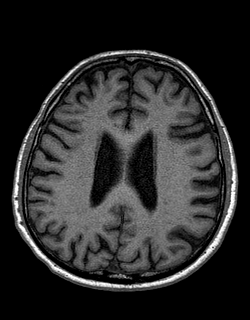
[im 120/160]
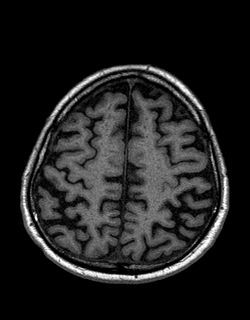
[im 140/160]
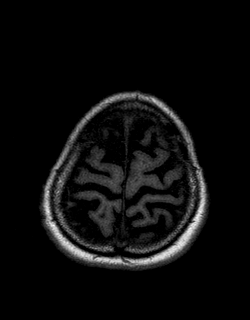
[im 160/160]
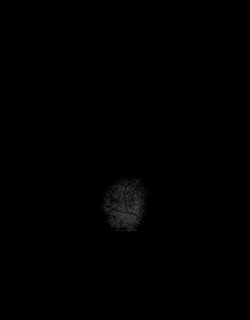

[Series 14: T1 post-contrast · coronal · 5.0mm · 0.72mm/px · 2 of 27 slices shown]
[im 1/27]
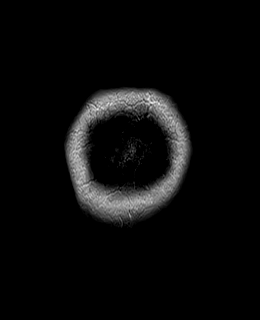
[im 27/27]
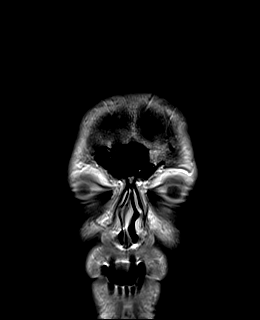

[Series 15: post t1_mpr_tra · axial · 1.0mm · 0.72mm/px · z∈[-60,+97]mm · 9 of 160 slices shown]
[im 1/160]
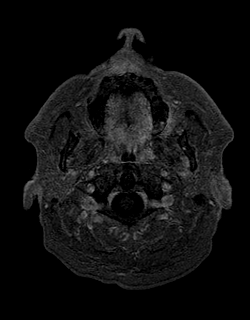
[im 20/160]
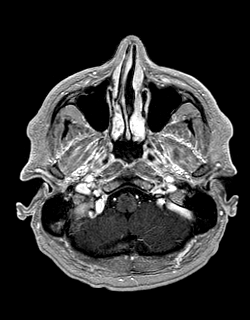
[im 40/160]
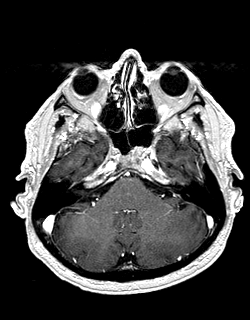
[im 60/160]
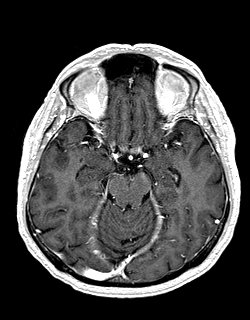
[im 80/160]
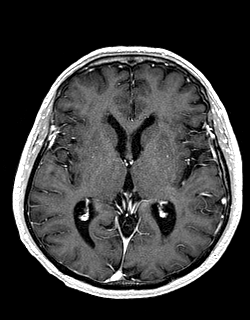
[im 100/160]
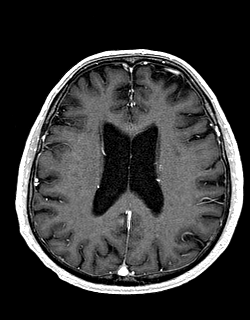
[im 120/160]
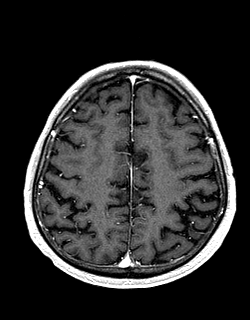
[im 140/160]
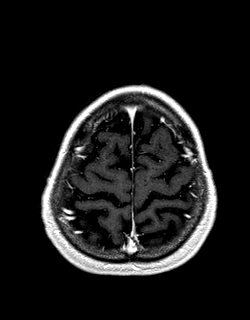
[im 160/160]
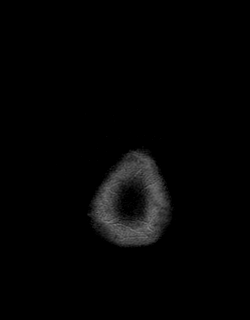

[48 of 48 positions shown; findings below may reference images not displayed]

FINDINGS: MRI HEAD FINDINGS

Brain: No acute infarction, hemorrhage, hydrocephalus, extra-axial
collection or mass lesion. Normal appearance of the sella. Normal
position of the cerebellar tonsils. Mild for age generalized
cerebral volume loss with ex vacuo ventricular dilation. No abnormal
enhancement.

Vascular: Major arterial flow voids are maintained skull base. Dural
sinuses are grossly patent. No evidence of dural sinus diverticulum.
See MRA head from yesterday for further evaluation.

Skull and upper cervical spine: Normal marrow signal.

Sinuses/Orbits: Clear visualized sinuses.  Unremarkable orbits.

Other: No mastoid effusions.

MRA NECK FINDINGDS

No evidence of significant (greater than 50% stenosis). Codominant
vertebral arteries. Please see MRA from yesterday for intracranial
evaluation.
IMPRESSION: MRI head:

1. No evidence of acute intracranial abnormality.
2. Moderate chronic microvascular ischemic disease and mild atrophy.

MRA neck:

No evidence of significant stenosis in the neck.

## 2020-08-18 IMAGING — MR MR MRA NECK WO/W CM
6 series · 48 of 48 positions shown · IV contrast (multihance)
Comparison: None.

CLINICAL DATA: Pulsatile tinnitus.

EXAM:
MRI HEAD WITH CONTRAST
MRA NECK WITHOUT AND WITH CONTRAST
TECHNIQUE: Multiplanar, multiecho pulse sequences of the brain and surrounding
structures were obtained with intravenous contrast. Angiographic
images of the neck were obtained using MRA technique with and
without intravenous contrast. Carotid stenosis measurements (when
applicable) are obtained utilizing NASCET criteria, using the distal
internal carotid diameter as the denominator.
CONTRAST:  13mL MULTIHANCE GADOBENATE DIMEGLUMINE 529 MG/ML IV SOLN

[Series 4: (id) · axial · 3.0mm · 0.98mm/px · z∈[-102,+17]mm · 9 of 60 slices shown]
[im 1/60]
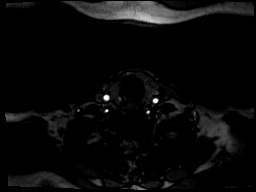
[im 8/60]
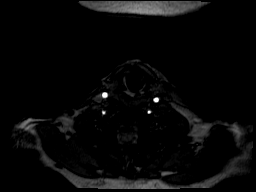
[im 15/60]
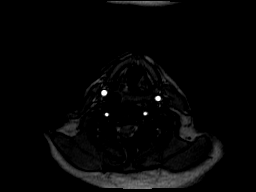
[im 23/60]
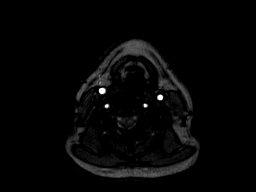
[im 30/60]
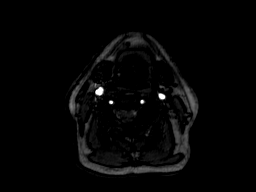
[im 37/60]
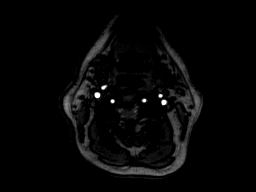
[im 45/60]
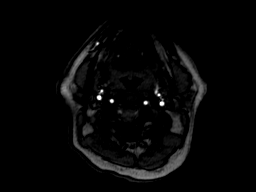
[im 52/60]
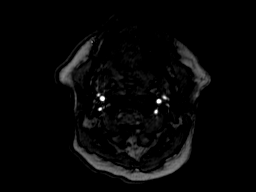
[im 60/60]
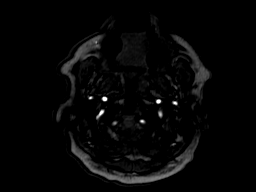

[Series 7: (id)_mip_tra · axial · 121.6mm · 0.98mm/px · 1 of 1 slices shown]
[im 1/1]
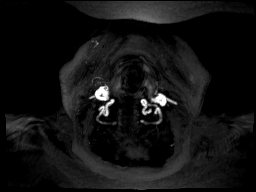

[Series 8: (id)_tt=1.0s · coronal · 0.8mm · 0.83mm/px · 12 of 80 slices shown (1 of 2)]
[im 1/80]
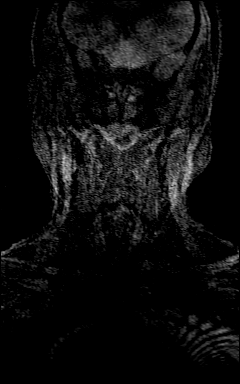
[im 8/80]
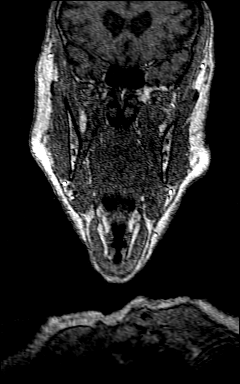
[im 15/80]
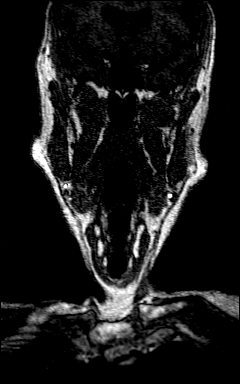
[im 22/80]
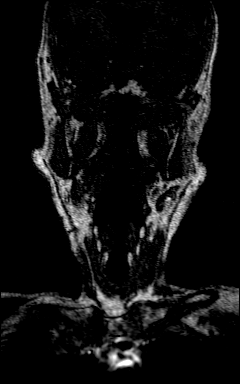
[im 29/80]
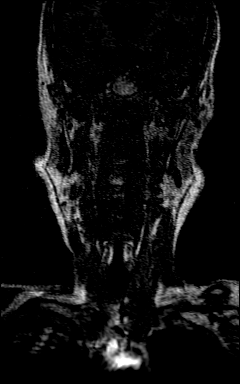
[im 36/80]
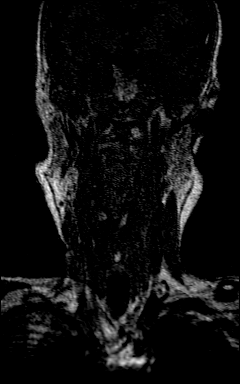
[im 44/80]
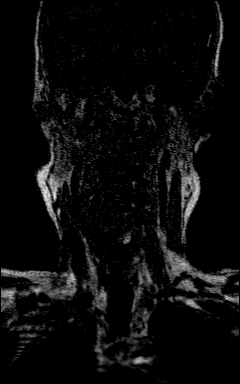
[im 51/80]
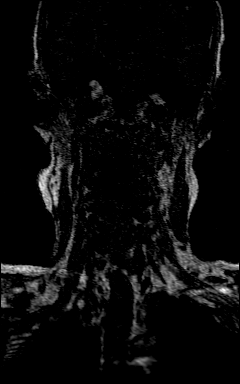
[im 58/80]
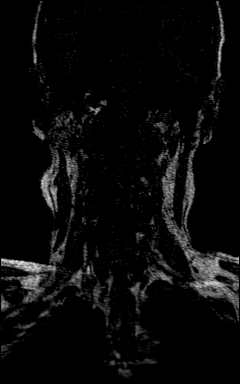
[im 65/80]
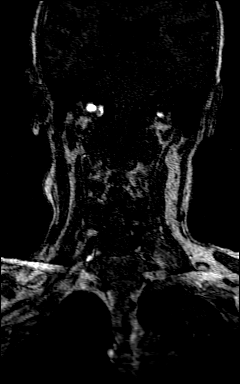
[im 72/80]
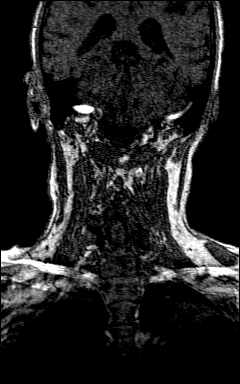
[im 80/80]
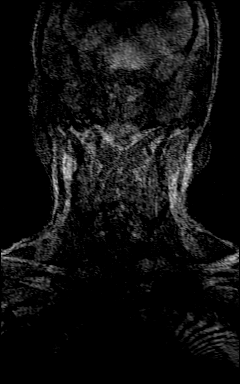

[Series 10: (id)_tt=1.0s · coronal · 0.8mm · 0.83mm/px · 13 of 80 slices shown (2 of 2)]
[im 1/80]
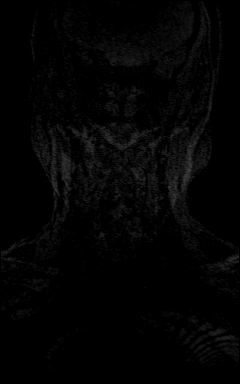
[im 7/80]
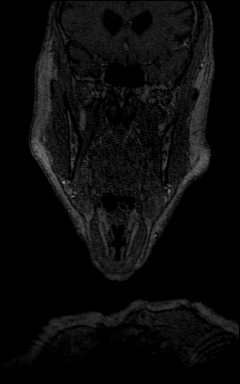
[im 14/80]
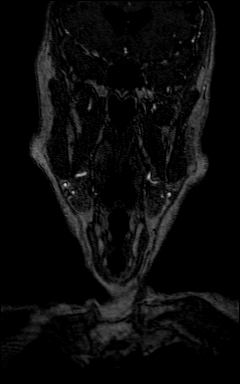
[im 20/80]
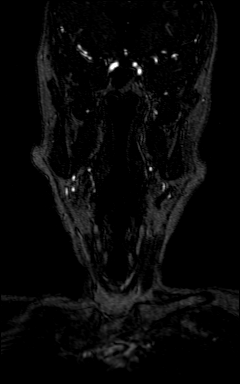
[im 27/80]
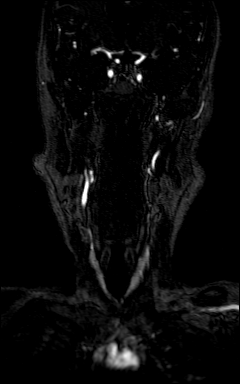
[im 33/80]
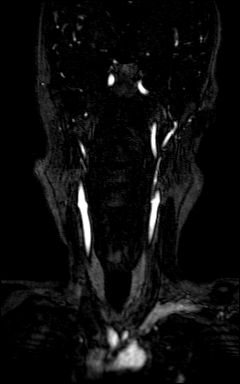
[im 40/80]
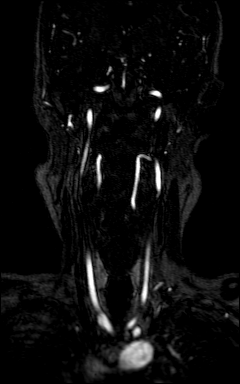
[im 47/80]
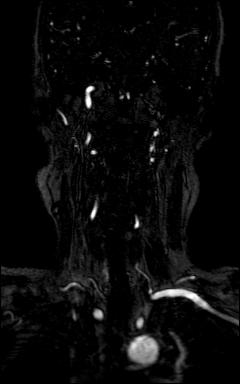
[im 53/80]
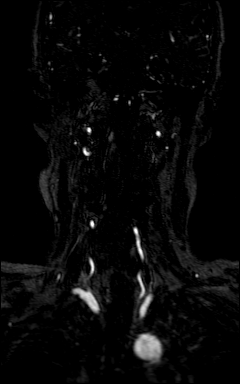
[im 60/80]
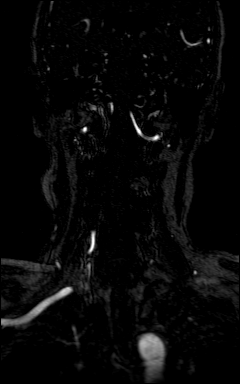
[im 66/80]
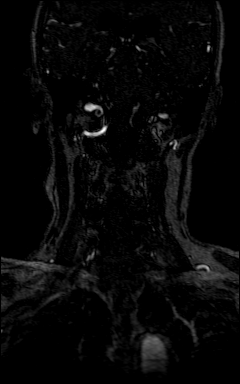
[im 73/80]
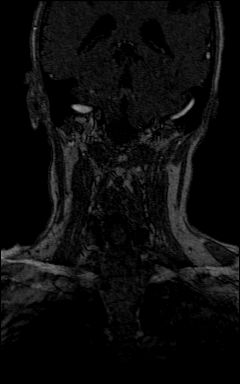
[im 80/80]
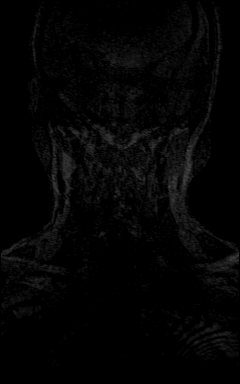

[Series 11: (id)_tt=1.0s_sub · coronal · 0.8mm · 0.83mm/px · 12 of 78 slices shown]
[im 1/78]
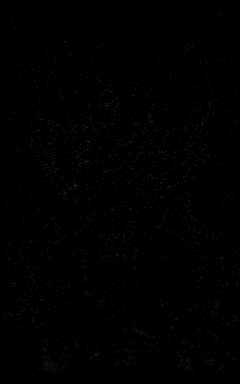
[im 8/78]
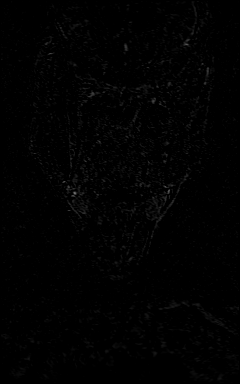
[im 15/78]
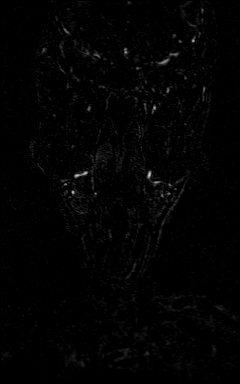
[im 22/78]
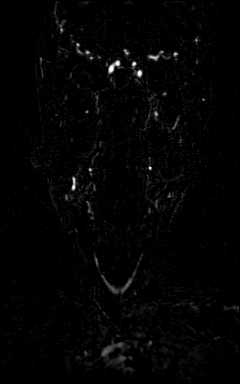
[im 29/78]
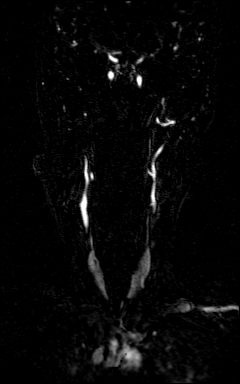
[im 36/78]
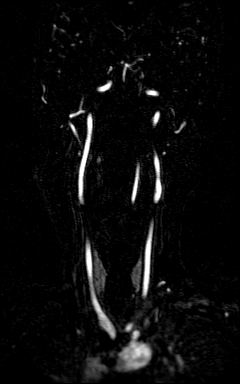
[im 43/78]
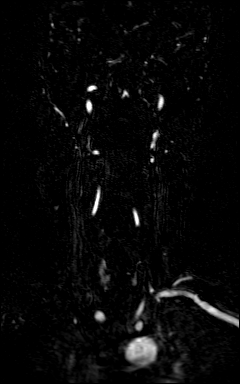
[im 50/78]
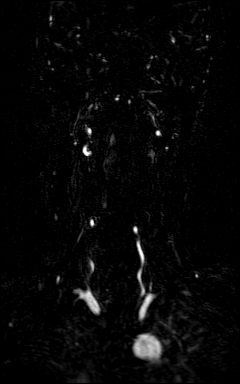
[im 57/78]
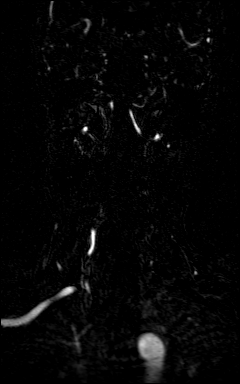
[im 64/78]
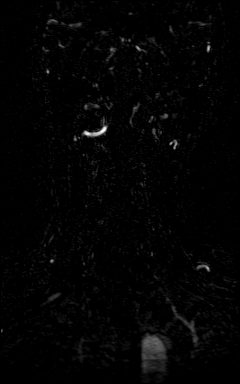
[im 71/78]
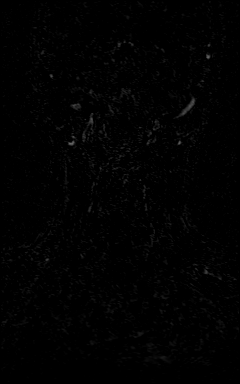
[im 78/78]
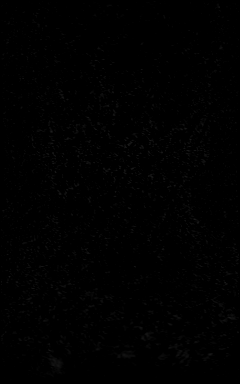

[Series 14: carotid bifur tumble · axial · 0.98mm/px · 1 of 3 slices shown]
[im 1/3]
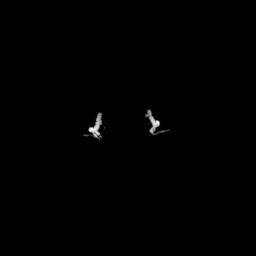

[48 of 48 positions shown; findings below may reference images not displayed]

FINDINGS: MRI HEAD FINDINGS

Brain: No acute infarction, hemorrhage, hydrocephalus, extra-axial
collection or mass lesion. Normal appearance of the sella. Normal
position of the cerebellar tonsils. Mild for age generalized
cerebral volume loss with ex vacuo ventricular dilation. No abnormal
enhancement.

Vascular: Major arterial flow voids are maintained skull base. Dural
sinuses are grossly patent. No evidence of dural sinus diverticulum.
See MRA head from yesterday for further evaluation.

Skull and upper cervical spine: Normal marrow signal.

Sinuses/Orbits: Clear visualized sinuses.  Unremarkable orbits.

Other: No mastoid effusions.

MRA NECK FINDINGDS

No evidence of significant (greater than 50% stenosis). Codominant
vertebral arteries. Please see MRA from yesterday for intracranial
evaluation.
IMPRESSION: MRI head:

1. No evidence of acute intracranial abnormality.
2. Moderate chronic microvascular ischemic disease and mild atrophy.

MRA neck:

No evidence of significant stenosis in the neck.

## 2020-08-18 MED ORDER — GADOBENATE DIMEGLUMINE 529 MG/ML IV SOLN
13.0000 mL | Freq: Once | INTRAVENOUS | Status: AC | PRN
  Administered 2020-08-18: 13 mL via INTRAVENOUS

## 2020-08-23 DIAGNOSIS — M6281 Muscle weakness (generalized): Secondary | ICD-10-CM | POA: Diagnosis not present

## 2020-08-23 DIAGNOSIS — M25661 Stiffness of right knee, not elsewhere classified: Secondary | ICD-10-CM | POA: Diagnosis not present

## 2020-08-24 ENCOUNTER — Other Ambulatory Visit: Payer: Self-pay

## 2020-08-24 ENCOUNTER — Other Ambulatory Visit (INDEPENDENT_AMBULATORY_CARE_PROVIDER_SITE_OTHER): Payer: Medicare Other

## 2020-08-24 DIAGNOSIS — I10 Essential (primary) hypertension: Secondary | ICD-10-CM | POA: Diagnosis not present

## 2020-08-24 DIAGNOSIS — R7303 Prediabetes: Secondary | ICD-10-CM | POA: Diagnosis not present

## 2020-08-24 DIAGNOSIS — E785 Hyperlipidemia, unspecified: Secondary | ICD-10-CM

## 2020-08-25 DIAGNOSIS — M25661 Stiffness of right knee, not elsewhere classified: Secondary | ICD-10-CM | POA: Diagnosis not present

## 2020-08-25 DIAGNOSIS — M6281 Muscle weakness (generalized): Secondary | ICD-10-CM | POA: Diagnosis not present

## 2020-08-25 LAB — COMPREHENSIVE METABOLIC PANEL
ALT: 24 U/L (ref 0–53)
AST: 21 U/L (ref 0–37)
Albumin: 4.2 g/dL (ref 3.5–5.2)
Alkaline Phosphatase: 87 U/L (ref 39–117)
BUN: 16 mg/dL (ref 6–23)
CO2: 33 mEq/L — ABNORMAL HIGH (ref 19–32)
Calcium: 9.6 mg/dL (ref 8.4–10.5)
Chloride: 100 mEq/L (ref 96–112)
Creatinine, Ser: 0.89 mg/dL (ref 0.40–1.50)
GFR: 81.2 mL/min (ref >60.00)
Glucose, Bld: 106 mg/dL — ABNORMAL HIGH (ref 70–99)
Potassium: 4.2 mEq/L (ref 3.5–5.1)
Sodium: 140 mEq/L (ref 135–145)
Total Bilirubin: 0.6 mg/dL (ref 0.2–1.2)
Total Protein: 6.6 g/dL (ref 6.0–8.3)

## 2020-08-25 LAB — LIPID PANEL
Cholesterol: 163 mg/dL (ref 0–200)
HDL: 51 mg/dL (ref >39.00)
LDL Cholesterol: 93 mg/dL (ref 0–99)
NonHDL: 112.17
Total CHOL/HDL Ratio: 3
Triglycerides: 94 mg/dL (ref 0.0–149.0)
VLDL: 18.8 mg/dL (ref 0.0–40.0)

## 2020-08-25 LAB — CBC
HCT: 42.7 % (ref 39.0–52.0)
Hemoglobin: 14.6 g/dL (ref 13.0–17.0)
MCHC: 34.3 g/dL (ref 30.0–36.0)
MCV: 91.6 fl (ref 78.0–100.0)
Platelets: 159 10*3/uL (ref 150.0–400.0)
RBC: 4.66 Mil/uL (ref 4.22–5.81)
RDW: 13.6 % (ref 11.5–15.5)
WBC: 4.7 10*3/uL (ref 4.0–10.5)

## 2020-08-25 LAB — HEMOGLOBIN A1C: Hgb A1c MFr Bld: 5.5 % (ref 4.6–6.5)

## 2020-08-30 DIAGNOSIS — Z471 Aftercare following joint replacement surgery: Secondary | ICD-10-CM | POA: Diagnosis not present

## 2020-08-30 DIAGNOSIS — M6281 Muscle weakness (generalized): Secondary | ICD-10-CM | POA: Diagnosis not present

## 2020-08-30 DIAGNOSIS — M25661 Stiffness of right knee, not elsewhere classified: Secondary | ICD-10-CM | POA: Diagnosis not present

## 2020-08-30 DIAGNOSIS — Z96651 Presence of right artificial knee joint: Secondary | ICD-10-CM | POA: Diagnosis not present

## 2020-08-31 DIAGNOSIS — M6281 Muscle weakness (generalized): Secondary | ICD-10-CM | POA: Diagnosis not present

## 2020-08-31 DIAGNOSIS — L570 Actinic keratosis: Secondary | ICD-10-CM | POA: Diagnosis not present

## 2020-08-31 DIAGNOSIS — M25661 Stiffness of right knee, not elsewhere classified: Secondary | ICD-10-CM | POA: Diagnosis not present

## 2020-09-06 DIAGNOSIS — M6281 Muscle weakness (generalized): Secondary | ICD-10-CM | POA: Diagnosis not present

## 2020-09-06 DIAGNOSIS — M25661 Stiffness of right knee, not elsewhere classified: Secondary | ICD-10-CM | POA: Diagnosis not present

## 2020-09-09 DIAGNOSIS — M6281 Muscle weakness (generalized): Secondary | ICD-10-CM | POA: Diagnosis not present

## 2020-09-09 DIAGNOSIS — M25661 Stiffness of right knee, not elsewhere classified: Secondary | ICD-10-CM | POA: Diagnosis not present

## 2020-09-12 DIAGNOSIS — M6281 Muscle weakness (generalized): Secondary | ICD-10-CM | POA: Diagnosis not present

## 2020-09-12 DIAGNOSIS — M25661 Stiffness of right knee, not elsewhere classified: Secondary | ICD-10-CM | POA: Diagnosis not present

## 2020-09-15 DIAGNOSIS — M6281 Muscle weakness (generalized): Secondary | ICD-10-CM | POA: Diagnosis not present

## 2020-09-15 DIAGNOSIS — M25661 Stiffness of right knee, not elsewhere classified: Secondary | ICD-10-CM | POA: Diagnosis not present

## 2020-09-16 DIAGNOSIS — D225 Melanocytic nevi of trunk: Secondary | ICD-10-CM | POA: Diagnosis not present

## 2020-09-16 DIAGNOSIS — L218 Other seborrheic dermatitis: Secondary | ICD-10-CM | POA: Diagnosis not present

## 2020-09-16 DIAGNOSIS — L578 Other skin changes due to chronic exposure to nonionizing radiation: Secondary | ICD-10-CM | POA: Diagnosis not present

## 2020-09-16 DIAGNOSIS — B353 Tinea pedis: Secondary | ICD-10-CM | POA: Diagnosis not present

## 2020-09-16 DIAGNOSIS — D485 Neoplasm of uncertain behavior of skin: Secondary | ICD-10-CM | POA: Diagnosis not present

## 2020-09-16 DIAGNOSIS — L814 Other melanin hyperpigmentation: Secondary | ICD-10-CM | POA: Diagnosis not present

## 2020-09-16 DIAGNOSIS — Z85828 Personal history of other malignant neoplasm of skin: Secondary | ICD-10-CM | POA: Diagnosis not present

## 2020-09-16 DIAGNOSIS — D1801 Hemangioma of skin and subcutaneous tissue: Secondary | ICD-10-CM | POA: Diagnosis not present

## 2020-09-16 DIAGNOSIS — L821 Other seborrheic keratosis: Secondary | ICD-10-CM | POA: Diagnosis not present

## 2020-09-16 DIAGNOSIS — Z85820 Personal history of malignant melanoma of skin: Secondary | ICD-10-CM | POA: Diagnosis not present

## 2020-09-16 DIAGNOSIS — L905 Scar conditions and fibrosis of skin: Secondary | ICD-10-CM | POA: Diagnosis not present

## 2020-09-20 DIAGNOSIS — M25661 Stiffness of right knee, not elsewhere classified: Secondary | ICD-10-CM | POA: Diagnosis not present

## 2020-09-20 DIAGNOSIS — M6281 Muscle weakness (generalized): Secondary | ICD-10-CM | POA: Diagnosis not present

## 2020-09-21 DIAGNOSIS — H0102B Squamous blepharitis left eye, upper and lower eyelids: Secondary | ICD-10-CM | POA: Diagnosis not present

## 2020-09-21 DIAGNOSIS — H0102A Squamous blepharitis right eye, upper and lower eyelids: Secondary | ICD-10-CM | POA: Diagnosis not present

## 2020-09-21 DIAGNOSIS — H25813 Combined forms of age-related cataract, bilateral: Secondary | ICD-10-CM | POA: Diagnosis not present

## 2020-09-21 DIAGNOSIS — H31091 Other chorioretinal scars, right eye: Secondary | ICD-10-CM | POA: Diagnosis not present

## 2020-09-22 DIAGNOSIS — M25661 Stiffness of right knee, not elsewhere classified: Secondary | ICD-10-CM | POA: Diagnosis not present

## 2020-09-22 DIAGNOSIS — M6281 Muscle weakness (generalized): Secondary | ICD-10-CM | POA: Diagnosis not present

## 2020-09-23 ENCOUNTER — Other Ambulatory Visit: Payer: Self-pay | Admitting: Family Medicine

## 2020-09-23 DIAGNOSIS — I10 Essential (primary) hypertension: Secondary | ICD-10-CM

## 2020-09-28 DIAGNOSIS — M6281 Muscle weakness (generalized): Secondary | ICD-10-CM | POA: Diagnosis not present

## 2020-09-28 DIAGNOSIS — M25661 Stiffness of right knee, not elsewhere classified: Secondary | ICD-10-CM | POA: Diagnosis not present

## 2020-10-04 DIAGNOSIS — N401 Enlarged prostate with lower urinary tract symptoms: Secondary | ICD-10-CM | POA: Diagnosis not present

## 2020-10-04 DIAGNOSIS — R3915 Urgency of urination: Secondary | ICD-10-CM | POA: Diagnosis not present

## 2020-10-04 DIAGNOSIS — R351 Nocturia: Secondary | ICD-10-CM | POA: Diagnosis not present

## 2020-10-04 DIAGNOSIS — N410 Acute prostatitis: Secondary | ICD-10-CM | POA: Diagnosis not present

## 2020-10-07 DIAGNOSIS — M25661 Stiffness of right knee, not elsewhere classified: Secondary | ICD-10-CM | POA: Diagnosis not present

## 2020-10-07 DIAGNOSIS — M6281 Muscle weakness (generalized): Secondary | ICD-10-CM | POA: Diagnosis not present

## 2020-10-10 DIAGNOSIS — M25661 Stiffness of right knee, not elsewhere classified: Secondary | ICD-10-CM | POA: Diagnosis not present

## 2020-10-10 DIAGNOSIS — M6281 Muscle weakness (generalized): Secondary | ICD-10-CM | POA: Diagnosis not present

## 2020-10-17 ENCOUNTER — Ambulatory Visit: Payer: Medicare Other | Admitting: *Deleted

## 2020-10-19 ENCOUNTER — Ambulatory Visit (INDEPENDENT_AMBULATORY_CARE_PROVIDER_SITE_OTHER): Payer: Medicare Other

## 2020-10-19 ENCOUNTER — Other Ambulatory Visit: Payer: Self-pay

## 2020-10-19 VITALS — BP 148/80 | HR 68 | Temp 98.4°F | Resp 16 | Ht 64.0 in | Wt 153.8 lb

## 2020-10-19 DIAGNOSIS — Z0000 Encounter for general adult medical examination without abnormal findings: Secondary | ICD-10-CM | POA: Diagnosis not present

## 2020-10-19 NOTE — Patient Instructions (Signed)
Ronnie Payne , Thank you for taking time to come for your Medicare Wellness Visit. I appreciate your ongoing commitment to your health goals. Please review the following plan we discussed and let me know if I can assist you in the future.   Screening recommendations/referrals: Colonoscopy: No longer required Recommended yearly ophthalmology/optometry visit for glaucoma screening and checkup Recommended yearly dental visit for hygiene and checkup  Vaccinations: Influenza vaccine: Declined Pneumococcal vaccine: Up to date Tdap vaccine: Up to date Shingles vaccine: Declined   Covid-19: Declined  Advanced directives: Copy in chart  Conditions/risks identified: See problem list  Next appointment: Follow up in one year for your annual wellness visit. 10/25/2021 @ 3:00  Preventive Care 80 Years and Older, Male Preventive care refers to lifestyle choices and visits with your health care provider that can promote health and wellness. What does preventive care include? A yearly physical exam. This is also called an annual well check. Dental exams once or twice a year. Routine eye exams. Ask your health care provider how often you should have your eyes checked. Personal lifestyle choices, including: Daily care of your teeth and gums. Regular physical activity. Eating a healthy diet. Avoiding tobacco and drug use. Limiting alcohol use. Practicing safe sex. Taking low doses of aspirin every day. Taking vitamin and mineral supplements as recommended by your health care provider. What happens during an annual well check? The services and screenings done by your health care provider during your annual well check will depend on your age, overall health, lifestyle risk factors, and family history of disease. Counseling  Your health care provider may ask you questions about your: Alcohol use. Tobacco use. Drug use. Emotional well-being. Home and relationship well-being. Sexual  activity. Eating habits. History of falls. Memory and ability to understand (cognition). Work and work Statistician. Screening  You may have the following tests or measurements: Height, weight, and BMI. Blood pressure. Lipid and cholesterol levels. These may be checked every 5 years, or more frequently if you are over 80 years old. Skin check. Lung cancer screening. You may have this screening every year starting at age 80 if you have a 30-pack-year history of smoking and currently smoke or have quit within the past 15 years. Fecal occult blood test (FOBT) of the stool. You may have this test every year starting at age 80. Flexible sigmoidoscopy or colonoscopy. You may have a sigmoidoscopy every 5 years or a colonoscopy every 10 years starting at age 80. Prostate cancer screening. Recommendations will vary depending on your family history and other risks. Hepatitis C blood test. Hepatitis B blood test. Sexually transmitted disease (STD) testing. Diabetes screening. This is done by checking your blood sugar (glucose) after you have not eaten for a while (fasting). You may have this done every 1-3 years. Abdominal aortic aneurysm (AAA) screening. You may need this if you are a current or former smoker. Osteoporosis. You may be screened starting at age 80 if you are at high risk. Talk with your health care provider about your test results, treatment options, and if necessary, the need for more tests. Vaccines  Your health care provider may recommend certain vaccines, such as: Influenza vaccine. This is recommended every year. Tetanus, diphtheria, and acellular pertussis (Tdap, Td) vaccine. You may need a Td booster every 10 years. Zoster vaccine. You may need this after age 20. Pneumococcal 13-valent conjugate (PCV13) vaccine. One dose is recommended after age 49. Pneumococcal polysaccharide (PPSV23) vaccine. One dose is recommended after age 58.  Talk to your health care provider about which  screenings and vaccines you need and how often you need them. This information is not intended to replace advice given to you by your health care provider. Make sure you discuss any questions you have with your health care provider. Document Released: 05/20/2015 Document Revised: 01/11/2016 Document Reviewed: 02/22/2015 Elsevier Interactive Patient Education  2017 [employer] Prevention in the Home Falls can cause injuries. They can happen to people of all ages. There are many things you can do to make your home safe and to help prevent falls. What can I do on the outside of my home? Regularly fix the edges of walkways and driveways and fix any cracks. Remove anything that might make you trip as you walk through a door, such as a raised step or threshold. Trim any bushes or trees on the path to your home. Use bright outdoor lighting. Clear any walking paths of anything that might make someone trip, such as rocks or tools. Regularly check to see if handrails are loose or broken. Make sure that both sides of any steps have handrails. Any raised decks and porches should have guardrails on the edges. Have any leaves, snow, or ice cleared regularly. Use sand or salt on walking paths during winter. Clean up any spills in your garage right away. This includes oil or grease spills. What can I do in the bathroom? Use night lights. Install grab bars by the toilet and in the tub and shower. Do not use towel bars as grab bars. Use non-skid mats or decals in the tub or shower. If you need to sit down in the shower, use a plastic, non-slip stool. Keep the floor dry. Clean up any water that spills on the floor as soon as it happens. Remove soap buildup in the tub or shower regularly. Attach bath mats securely with double-sided non-slip rug tape. Do not have throw rugs and other things on the floor that can make you trip. What can I do in the bedroom? Use night lights. Make sure that you have a  light by your bed that is easy to reach. Do not use any sheets or blankets that are too big for your bed. They should not hang down onto the floor. Have a firm chair that has side arms. You can use this for support while you get dressed. Do not have throw rugs and other things on the floor that can make you trip. What can I do in the kitchen? Clean up any spills right away. Avoid walking on wet floors. Keep items that you use a lot in easy-to-reach places. If you need to reach something above you, use a strong step stool that has a grab bar. Keep electrical cords out of the way. Do not use floor polish or wax that makes floors slippery. If you must use wax, use non-skid floor wax. Do not have throw rugs and other things on the floor that can make you trip. What can I do with my stairs? Do not leave any items on the stairs. Make sure that there are handrails on both sides of the stairs and use them. Fix handrails that are broken or loose. Make sure that handrails are as long as the stairways. Check any carpeting to make sure that it is firmly attached to the stairs. Fix any carpet that is loose or worn. Avoid having throw rugs at the top or bottom of the stairs. If you do have throw  rugs, attach them to the floor with carpet tape. Make sure that you have a light switch at the top of the stairs and the bottom of the stairs. If you do not have them, ask someone to add them for you. What else can I do to help prevent falls? Wear shoes that: Do not have high heels. Have rubber bottoms. Are comfortable and fit you well. Are closed at the toe. Do not wear sandals. If you use a stepladder: Make sure that it is fully opened. Do not climb a closed stepladder. Make sure that both sides of the stepladder are locked into place. Ask someone to hold it for you, if possible. Clearly mark and make sure that you can see: Any grab bars or handrails. First and last steps. Where the edge of each step  is. Use tools that help you move around (mobility aids) if they are needed. These include: Canes. Walkers. Scooters. Crutches. Turn on the lights when you go into a dark area. Replace any light bulbs as soon as they burn out. Set up your furniture so you have a clear path. Avoid moving your furniture around. If any of your floors are uneven, fix them. If there are any pets around you, be aware of where they are. Review your medicines with your doctor. Some medicines can make you feel dizzy. This can increase your chance of falling. Ask your doctor what other things that you can do to help prevent falls. This information is not intended to replace advice given to you by your health care provider. Make sure you discuss any questions you have with your health care provider. Document Released: 02/17/2009 Document Revised: 09/29/2015 Document Reviewed: 05/28/2014 Elsevier Interactive Patient Education  2017 [employer].

## 2020-10-19 NOTE — Progress Notes (Signed)
Subjective:   Ronnie Payne is a 80 y.o. male who presents for Medicare Annual/Subsequent preventive examination.  Review of Systems     Cardiac Risk Factors include: advanced age (>23men, >47 women);dyslipidemia;male gender     Objective:    Today's Vitals   10/19/20 1532  BP: (!) 148/80  Pulse: 68  Resp: 16  Temp: 98.4 F (36.9 C)  TempSrc: Temporal  SpO2: 95%  Weight: 153 lb 12.8 oz (69.8 kg)  Height: 5\' 4"  (1.626 m)   Body mass index is 26.4 kg/m.  Advanced Directives 10/19/2020 10/16/2019 09/10/2017 07/11/2017 05/30/2016  Does Patient Have a Medical Advance Directive? Yes Yes Yes Yes Yes  Type of Paramedic of [place];Living will [place];Living will [place];Living will [place];Living will [place];Living will  Does patient want to make changes to medical advance directive? - No - Patient declined - No - Patient declined -  Copy of [place] in Chart? No - copy requested No - copy requested No - copy requested No - copy requested -    Current Medications (verified) Outpatient Encounter Medications as of 10/19/2020  Medication Sig   acyclovir (ZOVIRAX) 400 MG tablet TAKE 1 TABLET BY MOUTH THREE TIMES DAILY FOR 5 DAYS WHEN YOU HAVE A FLARE ON YOUR LIP   atenolol (TENORMIN) 50 MG tablet Take 1 tablet by mouth once daily   atorvastatin (LIPITOR) 20 MG tablet TAKE 1 TABLET BY MOUTH ONCE DAILY IN THE EVENING   cholecalciferol (VITAMIN D) 1000 units tablet Take 1,000 Units by mouth daily.   Multiple Vitamins-Minerals (OCUVITE PO) Take 1 tablet by mouth daily.   multivitamin-iron-minerals-folic acid (CENTRUM) chewable tablet Chew 1 tablet by mouth daily.   temazepam (RESTORIL) 15 MG capsule TAKE 1 CAPSULE BY MOUTH AT BEDTIME AS NEEDED FOR SLEEP   No facility-administered encounter medications on file as of 10/19/2020.    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   Procedure: TOTAL HIP ARTHROPLASTY ANTERIOR APPROACH;  Surgeon: Frederik Pear, MD;  Location: [facility];  Service: Orthopedics;  Laterality: Left;   TRANSURETHRAL RESECTION OF PROSTATE  2000-2012   Family History  Problem Relation Age of Onset   Thyroid cancer Mother    Emphysema Father    Lung cancer Sister    Colon cancer Neg Hx    Colon polyps Neg Hx    Esophageal cancer Neg Hx    Stomach cancer Neg Hx    Rectal cancer Neg Hx    Social History   Socioeconomic History   Marital status: Married    Spouse name: Not on file   Number of children: 1   Years of education: Not on file   Highest education level: Not on file  Occupational History   Occupation: retired    Comment: Optometrist  Tobacco Use   Smoking status: Never   Smokeless tobacco: Never  Vaping Use   Vaping Use: Never used  Substance and Sexual Activity   Alcohol use: No   Drug use: No   Sexual  activity: Yes  Other Topics Concern   Not on file  Social History Narrative   Not on file   Social Determinants of Health   Financial Resource Strain: Low Risk    Difficulty of Paying Living Expenses: Not hard at all  Food Insecurity: No Food Insecurity   Worried About Charity fundraiser in the Last Year: Never true   [place] in the Last Year: Never true  Transportation Needs: No Transportation Needs   Lack of Transportation (Medical): No   Lack of Transportation (Non-Medical): No  Physical Activity: Sufficiently Active   Days of Exercise per Week: 3 days   Minutes of Exercise per Session: 60 min  Stress: No Stress Concern Present   Feeling of  Stress : Not at all  Social Connections: Moderately Integrated   Frequency of Communication with Friends and Family: More than three times a week   Frequency of Social Gatherings with Friends and Family: More than three times a week   Attends Religious Services: More than 4 times per year   Active Member of [employer] or Organizations: No   Attends Music therapist: Never   Marital Status: Married    Tobacco Counseling Counseling given: Not Answered   Clinical Intake:  Pre-visit preparation completed: Yes  Pain : No/denies pain     Nutritional Status: BMI 25 -29 Overweight Nutritional Risks: None Diabetes: No  How often do you need to have someone help you when you read instructions, pamphlets, or other written materials from your doctor or pharmacy?: 1 - Never  Diabetic?No  Interpreter Needed?: No  Information entered by :: Caroleen Hamman LPN   Activities of Daily Living In your present state of health, do you have any difficulty performing the following activities: 10/19/2020  Hearing? N  Vision? N  Difficulty concentrating or making decisions? N  Walking or climbing stairs? N  Dressing or bathing? N  Doing errands, shopping? N  Preparing Food and eating ? N  Using the Toilet? N  In the past six months, have you accidently leaked urine? N  Do you have problems with loss of bowel control? N  Managing your Medications? N  Managing your Finances? N  Housekeeping or managing your Housekeeping? N  Some recent data might be hidden    Patient Care Team: Shelda Pal, DO as PCP - General (Family Medicine)  Indicate any recent Medical Services you may have received from other than [facility] providers in the past year (date may be approximate).     Assessment:   This is a routine wellness examination for [facility].  Hearing/Vision screen Hearing Screening - Comments:: No issues Vision Screening - Comments:: Wears glasses Last eye exam-09/2020-Dr.  Redmond Baseman  Dietary issues and exercise activities discussed: Current Exercise Habits: Home exercise routine, Type of exercise: strength training/weights;Other - see comments (hitting tennis balls), Time (Minutes): 60, Frequency (Times/Week): 3, Weekly Exercise (Minutes/Week): 180, Intensity: Mild, Exercise limited by: None identified   Goals Addressed             This Visit's Progress    Continue exercising.   On track    Patient Stated       Maintain current health        Depression Screen PHQ 2/9 Scores 10/19/2020 10/16/2019 07/11/2017 04/25/2016  PHQ - 2 Score 0 0 0 0    Fall Risk Fall Risk  10/19/2020 10/16/2019 12/05/2018 07/11/2017 04/25/2016  Falls in the past year? 0 0 0 No  No  Comment - - Emmi Telephone Survey: data to providers prior to load - -  Number falls in past yr: 0 0 - - -  Injury with Fall? 0 0 - - -  Follow up Falls prevention discussed Education provided;Falls prevention discussed - - -    FALL RISK PREVENTION PERTAINING TO THE HOME:  Any stairs in or around the home? Yes  If so, are there any without handrails? No  Home free of loose throw rugs in walkways, pet beds, electrical cords, etc? Yes  Adequate lighting in your home to reduce risk of falls? Yes   ASSISTIVE DEVICES UTILIZED TO PREVENT FALLS:  Life alert? No  Use of a cane, walker or w/c? No  Grab bars in the bathroom? Yes  Shower chair or bench in shower? No  Elevated toilet seat or a handicapped toilet? No   TIMED UP AND GO:  Was the test performed? Yes .  Length of time to ambulate 10 feet: 10 sec.   Gait steady and fast without use of assistive device  Cognitive Function: MMSE - Mini Mental State Exam 07/11/2017  Orientation to time 5  Orientation to Place 5  Registration 3  Attention/ Calculation 5  Recall 2  Language- name 2 objects 2  Language- repeat 1  Language- follow 3 step command 3  Language- read & follow direction 1  Write a sentence 1  Copy design 1  Total score 29         Immunizations Immunization History  Administered Date(s) Administered   Influenza, High Dose Seasonal PF 01/06/2016, 02/19/2017, 02/03/2018   PFIZER(Purple Top)SARS-COV-2 Vaccination 07/15/2019, 08/11/2019   Pneumococcal Polysaccharide-23 02/19/2017   Pneumococcal-Unspecified 02/05/2015   Tdap 09/22/2018    TDAP status: Up to date  Flu vaccine status: Due 01/2021  Pneumococcal vaccine status: Up to date  Covid-19 vaccine status: Declined, Education has been provided regarding the importance of this vaccine but patient still declined. Advised may receive this vaccine at local pharmacy or Health Dept.or vaccine clinic. Aware to provide a copy of the vaccination record if obtained from local pharmacy or Health Dept. Verbalized acceptance and understanding.  Qualifies for Shingles Vaccine? Yes   Zostavax completed No   Shingrix Completed?: No.    Education has been provided regarding the importance of this vaccine. Patient has been advised to call insurance company to determine out of pocket expense if they have not yet received this vaccine. Advised may also receive vaccine at local pharmacy or Health Dept. Verbalized acceptance and understanding.  Screening Tests Health Maintenance  Topic Date Due   Zoster Vaccines- Shingrix (1 of 2) Never done   PNA vac Low Risk Adult (2 of 2 - PCV13) 02/19/2018   COVID-19 Vaccine (3 - Booster for Pfizer series) 01/11/2020   INFLUENZA VACCINE  12/05/2020   TETANUS/TDAP  09/21/2028   HPV VACCINES  Aged Out    Health Maintenance  Health Maintenance Due  Topic Date Due   Zoster Vaccines- Shingrix (1 of 2) Never done   PNA vac Low Risk Adult (2 of 2 - PCV13) 02/19/2018   COVID-19 Vaccine (3 - Booster for Pfizer series) 01/11/2020    Colorectal cancer screening: No longer required.   Lung Cancer Screening: (Low Dose CT Chest recommended if Age 16-80 years, 30 pack-year currently smoking OR have quit w/in 15years.) does not qualify.      Additional Screening:  Hepatitis C Screening: does not qualify  Vision Screening: Recommended annual ophthalmology exams for  early detection of glaucoma and other disorders of the eye. Is the patient up to date with their annual eye exam?  Yes  Who is the provider or what is the name of the office in which the patient attends annual eye exams? Dr. Redmond Baseman   Dental Screening: Recommended annual dental exams for proper oral hygiene  Community Resource Referral / Chronic Care Management: CRR required this visit?  No   CCM required this visit?  No      Plan:     I have personally reviewed and noted the following in the patient's chart:   Medical and social history Use of alcohol, tobacco or illicit drugs  Current medications and supplements including opioid prescriptions. Patient is not currently taking opioid prescriptions. Functional ability and status Nutritional status Physical activity Advanced directives List of other physicians Hospitalizations, surgeries, and ER visits in previous 12 months Vitals Screenings to include cognitive, depression, and falls Referrals and appointments  In addition, I have reviewed and discussed with patient certain preventive protocols, quality metrics, and best practice recommendations. A written personalized care plan for preventive services as well as general preventive health recommendations were provided to patient.   Patient to access avs on mychart.   Marta Antu, LPN   10/21/735  Nurse Health Advisor  Nurse Notes: None

## 2020-10-20 ENCOUNTER — Ambulatory Visit: Payer: Medicare Other

## 2020-11-08 DIAGNOSIS — Z20822 Contact with and (suspected) exposure to covid-19: Secondary | ICD-10-CM | POA: Diagnosis not present

## 2020-11-10 DIAGNOSIS — R351 Nocturia: Secondary | ICD-10-CM | POA: Diagnosis not present

## 2020-11-10 DIAGNOSIS — R350 Frequency of micturition: Secondary | ICD-10-CM | POA: Diagnosis not present

## 2020-11-10 DIAGNOSIS — R300 Dysuria: Secondary | ICD-10-CM | POA: Diagnosis not present

## 2020-11-10 DIAGNOSIS — N401 Enlarged prostate with lower urinary tract symptoms: Secondary | ICD-10-CM | POA: Diagnosis not present

## 2020-11-14 ENCOUNTER — Other Ambulatory Visit: Payer: Self-pay | Admitting: Family Medicine

## 2020-12-06 DIAGNOSIS — L905 Scar conditions and fibrosis of skin: Secondary | ICD-10-CM | POA: Diagnosis not present

## 2020-12-06 DIAGNOSIS — L570 Actinic keratosis: Secondary | ICD-10-CM | POA: Diagnosis not present

## 2020-12-06 DIAGNOSIS — D225 Melanocytic nevi of trunk: Secondary | ICD-10-CM | POA: Diagnosis not present

## 2020-12-06 DIAGNOSIS — Z85828 Personal history of other malignant neoplasm of skin: Secondary | ICD-10-CM | POA: Diagnosis not present

## 2020-12-06 DIAGNOSIS — B353 Tinea pedis: Secondary | ICD-10-CM | POA: Diagnosis not present

## 2020-12-06 DIAGNOSIS — Z85820 Personal history of malignant melanoma of skin: Secondary | ICD-10-CM | POA: Diagnosis not present

## 2020-12-06 DIAGNOSIS — L821 Other seborrheic keratosis: Secondary | ICD-10-CM | POA: Diagnosis not present

## 2020-12-06 DIAGNOSIS — D1801 Hemangioma of skin and subcutaneous tissue: Secondary | ICD-10-CM | POA: Diagnosis not present

## 2020-12-21 ENCOUNTER — Ambulatory Visit (INDEPENDENT_AMBULATORY_CARE_PROVIDER_SITE_OTHER): Payer: Medicare Other | Admitting: Internal Medicine

## 2020-12-21 ENCOUNTER — Other Ambulatory Visit: Payer: Self-pay

## 2020-12-21 ENCOUNTER — Encounter: Payer: Self-pay | Admitting: Internal Medicine

## 2020-12-21 ENCOUNTER — Other Ambulatory Visit: Payer: Self-pay | Admitting: Family Medicine

## 2020-12-21 VITALS — BP 142/70 | HR 58 | Temp 98.4°F | Resp 18 | Ht 64.0 in | Wt 148.5 lb

## 2020-12-21 DIAGNOSIS — I10 Essential (primary) hypertension: Secondary | ICD-10-CM

## 2020-12-21 DIAGNOSIS — M545 Low back pain, unspecified: Secondary | ICD-10-CM

## 2020-12-21 DIAGNOSIS — G4700 Insomnia, unspecified: Secondary | ICD-10-CM

## 2020-12-21 NOTE — Progress Notes (Signed)
Subjective:    Patient ID: Ronnie Payne, male    DOB: July 01, 1940, 80 y.o.   MRN: [id number]  DOS:  12/21/2020 Type of visit - description: Acute  Developed right low back pain, started yesterday. Took naproxen OTC 2 tablets, pain decreased and he was able to do his normal routine. Today the pain is a still there but not severe. Denies any radiation or lower extremity paresthesias No rash Denies any injury or heavy lifting.  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 If you have any questions, ask your nurse or doctor.          acyclovir 400 MG tablet Commonly known as: ZOVIRAX TAKE 1 TABLET BY MOUTH THREE TIMES DAILY FOR 5 DAYS WHEN YOU HAVE A FLARE ON YOUR LIP   atenolol 50 MG tablet Commonly known as: TENORMIN Take 1 tablet by mouth once daily   atorvastatin 20 MG tablet Commonly known as: LIPITOR TAKE 1 TABLET BY MOUTH ONCE DAILY IN THE EVENING   cholecalciferol 1000 units tablet Commonly known as: VITAMIN D Take 1,000 Units by mouth daily.   multivitamin-iron-minerals-folic acid chewable tablet Chew 1 tablet by mouth daily. What changed: Another medication with the same name was removed. Continue taking this medication, and follow the directions you see here. Changed by: Kathlene November, MD    temazepam 15 MG capsule Commonly known as: RESTORIL TAKE 1 CAPSULE BY MOUTH AT BEDTIME AS NEEDED FOR SLEEP           Objective:   Physical Exam BP (!) 142/70 (BP Location: Left Arm, Patient Position: Sitting, Cuff Size: Small)   Pulse (!) 58   Temp 98.4 F (36.9 C) (Oral)   Resp 18   Ht '5\' 4"'$  (1.626 m)   Wt 148 lb 8 oz (67.4 kg)   SpO2 98%   BMI 25.49 kg/m  General:   Well developed, NAD, BMI noted. HEENT:  Normocephalic . Face symmetric, atraumatic MSK: No TTP at the lumbar spine or SIs. Lower extremities: no pretibial edema bilaterally  Skin: Not pale. Not jaundice.  No rash Neurologic:  alert & oriented X3.  Speech normal, gait appropriate for age and unassisted Motor and DTR symmetric.  Straight leg test negative.  No antalgic posture. Psych--  Cognition and judgment appear intact.  Cooperative with normal attention span and concentration.  Behavior appropriate. No anxious or depressed appearing.      Assessment     80 year old gentleman, PMH includes high cholesterol, DJD, prediabetes, insomnia, presents with:  Lumbalgia: Symptoms as described above, no red flags, recommend conservative treatment with Tylenol instead of naproxen, ice/heat.  Wait few  more days.  If not better or symptoms severe he is to let us know. Insomnia: Currently on temazepam, he is somewhat concerned about risk of addiction, he takes it rarely, I believe risk of addiction is low.  We did talk about the risk of falls and prevention. Encouraged to see PCP, has not been seen in more than a year.   This visit occurred during the SARS-CoV-2 public health emergency.  Safety protocols were in place, including screening questions prior to the visit, additional usage of staff PPE, and extensive cleaning of exam room while observing appropriate contact time as indicated for disinfecting solutions.

## 2020-12-21 NOTE — Patient Instructions (Signed)
Tylenol  500 mg OTC 2 tabs a day every 8 hours as needed for pain  Try an ice pack or a heating pad  Call if not gradually better  You are due to see Dr Nani Ravens, please make an appointment

## 2021-01-16 DIAGNOSIS — N401 Enlarged prostate with lower urinary tract symptoms: Secondary | ICD-10-CM | POA: Diagnosis not present

## 2021-01-16 DIAGNOSIS — N5201 Erectile dysfunction due to arterial insufficiency: Secondary | ICD-10-CM | POA: Diagnosis not present

## 2021-01-16 DIAGNOSIS — R310 Gross hematuria: Secondary | ICD-10-CM | POA: Diagnosis not present

## 2021-01-16 DIAGNOSIS — R350 Frequency of micturition: Secondary | ICD-10-CM | POA: Diagnosis not present

## 2021-01-20 DIAGNOSIS — M545 Low back pain, unspecified: Secondary | ICD-10-CM | POA: Diagnosis not present

## 2021-01-20 DIAGNOSIS — S39012A Strain of muscle, fascia and tendon of lower back, initial encounter: Secondary | ICD-10-CM | POA: Diagnosis not present

## 2021-01-23 ENCOUNTER — Other Ambulatory Visit: Payer: Self-pay | Admitting: Family Medicine

## 2021-01-23 DIAGNOSIS — G4700 Insomnia, unspecified: Secondary | ICD-10-CM

## 2021-01-25 ENCOUNTER — Other Ambulatory Visit: Payer: Self-pay | Admitting: Family Medicine

## 2021-01-25 DIAGNOSIS — G4700 Insomnia, unspecified: Secondary | ICD-10-CM

## 2021-02-10 NOTE — Progress Notes (Signed)
[facility] [facility] [address], [place], [place] [address] [phone]  Date:  02/13/2021   Name:  Ronnie Payne   DOB:  11-11-1940   MRN:  [id number]  PCP:  Shelda Pal, DO    Chief Complaint: right shoulder pain (X 1 week. This feels like a nodule. No otc tx needed. )   History of Present Illness:  Ronnie Payne is a 80 y.o. very pleasant male patient who presents with the following:  Pt of Dr Nani Ravens, here today with concern of shoulder pain  History of pre-diabetes, hyperlipidemia   Last week he noted a bump on his right shoulder - he wanted to have it checked out to make sure all is ok  It is sensitive but not painful Normal ROM of the right shoulder.  No known injury The nodules been present for about a week he thinks.  He has nocturia x2-3 per night It notes it takes him a bit of time to get back to sleep after he gets up to urinate He tried another med for his insomnia but did not like the SE He uses temazepam on occasion- does not use every day He wonders if I can refill this for him today because his PCP is out of town.  Declines flu shot, he also wonders if what he has read on the Internet is true and if COVID vaccines will be pulled off the market due to causing "more harm than good" I advised him that this is not true.  Patient Active Problem List   Diagnosis Date Noted   DNR (do not resuscitate) 10/09/2019   Prediabetes 08/31/2019   Hyperlipidemia 08/31/2019   CAP (community acquired pneumonia) 08/04/2018   Chest pain, musculoskeletal 08/04/2018   Cold sore    Primary osteoarthritis of left knee 09/20/2017   Osteoarthritis of left hip 09/17/2017    Past Medical History:  Diagnosis Date   Arthritis    Cold sore    Frequent headaches    History of skin cancer    Hyperlipidemia    Hypertension    Prediabetes     Past Surgical History:  Procedure Laterality Date    INGUINAL HERNIA REPAIR  10/06/2014   MENISCUS REPAIR Right    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   Procedure: TOTAL HIP ARTHROPLASTY ANTERIOR APPROACH;  Surgeon: Frederik Pear, MD;  Location: [facility];  Service: Orthopedics;  Laterality: Left;   TRANSURETHRAL RESECTION OF PROSTATE  2000-2012    Social History   Tobacco Use   Smoking status: Never   Smokeless tobacco: Never  Vaping Use   Vaping Use: Never used  Substance Use Topics   Alcohol use: No   Drug use: No    Family History  Problem Relation Age of Onset   Thyroid cancer Mother    Emphysema Father    Lung cancer Sister    Colon cancer Neg Hx    Colon polyps Neg Hx    Esophageal cancer Neg Hx    Stomach cancer Neg Hx    Rectal cancer Neg Hx     Allergies  Allergen Reactions   Doxycycline     Unknown reaction    Medication list has been reviewed and updated.  Current Outpatient Medications on File Prior to Visit  Medication Sig Dispense Refill   acyclovir (ZOVIRAX) 400 MG tablet TAKE 1 TABLET BY MOUTH THREE TIMES DAILY FOR 5 DAYS WHEN YOU  HAVE A FLARE ON YOUR LIP 30 tablet 0   atenolol (TENORMIN) 50 MG tablet Take 1 tablet by mouth once daily 90 tablet 0   atorvastatin (LIPITOR) 20 MG tablet TAKE 1 TABLET BY MOUTH ONCE DAILY IN THE EVENING 90 tablet 0   temazepam (RESTORIL) 15 MG capsule TAKE 1 CAPSULE BY MOUTH AT BEDTIME AS NEEDED FOR SLEEP 30 capsule 3   No current facility-administered medications on file prior to visit.    Review of Systems:  As per HPI- otherwise negative.   Physical Examination: Vitals:   02/13/21 1044  BP: 140/80  Pulse: (!) 56  Resp: 18  Temp: 97.6 F (36.4 C)  SpO2: 99%   Vitals:   02/13/21 1044  Weight: 146 lb 9.6 oz (66.5 kg)  Height: 5\' 4"  (1.626 m)   Body mass index is 25.16 kg/m. Ideal Body Weight: Weight in (lb) to have BMI = 25: 145.3  GEN: no acute distress.  Normal weight, looks well HEENT: Atraumatic, Normocephalic.  Ears and Nose: No external  deformity. CV: RRR, No M/G/R. No JVD. No thrill. No extra heart sounds. PULM: CTA B, no wheezes, crackles, rhonchi. No retractions. No resp. distress. No accessory muscle use. EXTR: No c/c/e PSYCH: Normally interactive. Conversant.  There is a likely cystic structure over the right acromioclavicular joint.  It is somewhat mobile, slightly tender to palpation.  No redness or heat.  Normal range of motion of the right shoulder  Assessment and Plan: AC joint derangement - Plan: DG Clavicle Right  Insomnia, unspecified type - Plan: temazepam (RESTORIL) 15 MG capsule  Patient seen today with concern of a likely nodule over the right AC joint.  I suspect this is related to degenerative change.  We will obtain plain films today.  Advised patient that pending results we can have him see orthopedics as desired  Refilled temazepam to use as needed for occasional insomnia  Signed Lamar Blinks, MD  Received x-ray as below, message to patient  DG Clavicle Right  Result Date: 02/13/2021 CLINICAL DATA:  Palpable abnormality acromioclavicular joint EXAM: RIGHT CLAVICLE - 2+ VIEWS COMPARISON:  None. FINDINGS: Frontal and lordotic views of the right clavicle are obtained. No acute or destructive bony lesions. Alignment is anatomic. There are mild hypertrophic changes of the acromioclavicular joint. Visualized portions of the lungs are clear. IMPRESSION: 1. Mild right acromioclavicular joint hypertrophy. No acute bony abnormality. Electronically Signed   By: Randa Ngo M.D.   On: 02/13/2021 15:57

## 2021-02-13 ENCOUNTER — Ambulatory Visit (HOSPITAL_BASED_OUTPATIENT_CLINIC_OR_DEPARTMENT_OTHER)
Admission: RE | Admit: 2021-02-13 | Discharge: 2021-02-13 | Disposition: A | Discharge status: Home / Self Care | Payer: Medicare Other | Source: Ambulatory Visit | Attending: Family Medicine | Admitting: Family Medicine

## 2021-02-13 ENCOUNTER — Encounter: Payer: Self-pay | Admitting: Family Medicine

## 2021-02-13 ENCOUNTER — Other Ambulatory Visit: Payer: Self-pay

## 2021-02-13 ENCOUNTER — Ambulatory Visit (INDEPENDENT_AMBULATORY_CARE_PROVIDER_SITE_OTHER): Payer: Medicare Other | Admitting: Family Medicine

## 2021-02-13 VITALS — BP 140/80 | HR 56 | Temp 97.6°F | Resp 18 | Ht 64.0 in | Wt 146.6 lb

## 2021-02-13 DIAGNOSIS — M249 Joint derangement, unspecified: Secondary | ICD-10-CM | POA: Diagnosis not present

## 2021-02-13 DIAGNOSIS — G4700 Insomnia, unspecified: Secondary | ICD-10-CM

## 2021-02-13 DIAGNOSIS — M89311 Hypertrophy of bone, right shoulder: Secondary | ICD-10-CM | POA: Diagnosis not present

## 2021-02-13 IMAGING — DX DG CLAVICLE*R*
2 series · 2 of 2 positions shown · non-contrast
Comparison: None.

CLINICAL DATA: Palpable abnormality acromioclavicular joint

EXAM:
RIGHT CLAVICLE - 2+ VIEWS

[clavicle ap]
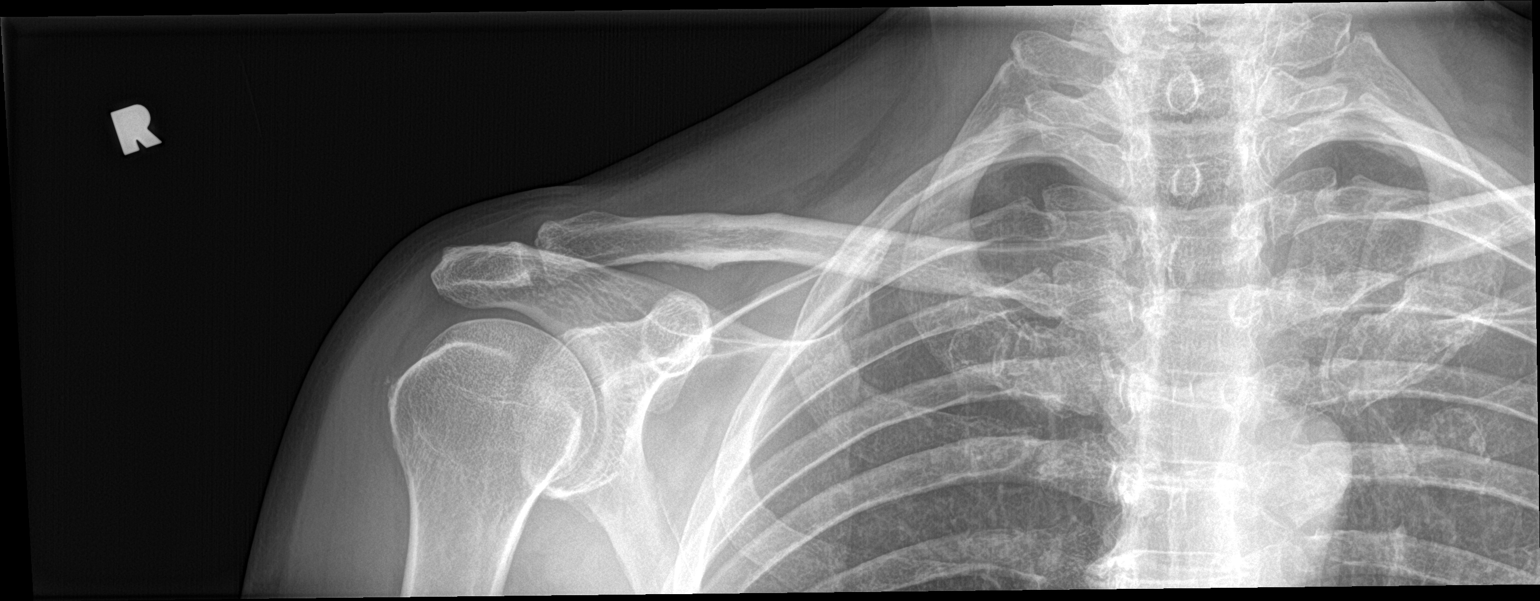

[clavicle axial]
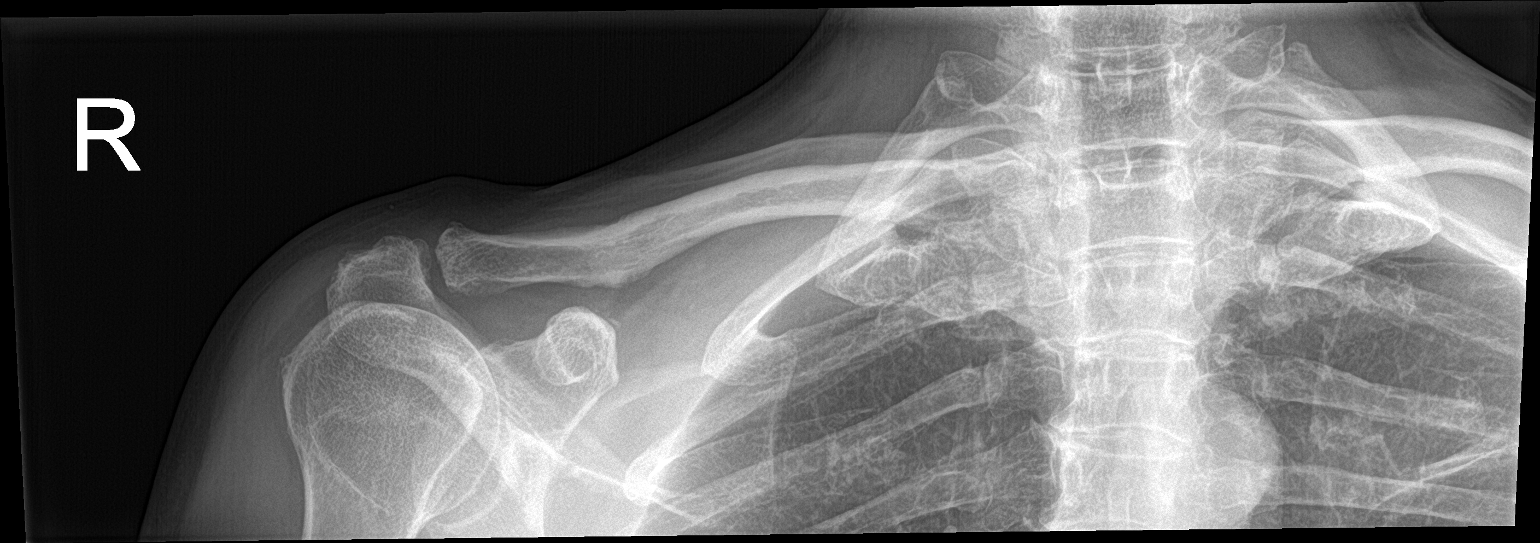

[2 of 2 positions shown; findings below may reference images not displayed]

FINDINGS: Frontal and lordotic views of the right clavicle are obtained. No
acute or destructive bony lesions. Alignment is anatomic. There are
mild hypertrophic changes of the acromioclavicular joint. Visualized
portions of the lungs are clear.
IMPRESSION: 1. Mild right acromioclavicular joint hypertrophy. No acute bony
abnormality.

## 2021-02-13 MED ORDER — TEMAZEPAM 15 MG PO CAPS: ORAL_CAPSULE | ORAL | 1 refills | Status: DC

## 2021-02-13 NOTE — Patient Instructions (Signed)
Please stop by imaging on the ground floor to get an x-ray of your right shoulder blade- I will be in touch with this report asap I refilled your temazepam today Please schedule a visit with Dr Nani Ravens in the next couple of months- make sure they understand you want an office visit and not just labs!    Covid and flu vaccination are still most definitely recommended for all eligible persons

## 2021-02-24 ENCOUNTER — Other Ambulatory Visit: Payer: Self-pay | Admitting: Family Medicine

## 2021-02-24 DIAGNOSIS — M24811 Other specific joint derangements of right shoulder, not elsewhere classified: Secondary | ICD-10-CM | POA: Diagnosis not present

## 2021-03-01 DIAGNOSIS — L570 Actinic keratosis: Secondary | ICD-10-CM | POA: Diagnosis not present

## 2021-03-02 ENCOUNTER — Telehealth: Payer: Self-pay | Admitting: Family Medicine

## 2021-03-02 NOTE — Telephone Encounter (Signed)
I left a message for patient to call back and reschedule his appointment for his AWV on Wednesday, 10/25/21.  The [facility], Jana Half, won't be working on Wednesdays.  Please reschedule appointment to another day after 10/19/21.

## 2021-03-21 ENCOUNTER — Other Ambulatory Visit: Payer: Self-pay | Admitting: Family Medicine

## 2021-03-21 DIAGNOSIS — I10 Essential (primary) hypertension: Secondary | ICD-10-CM

## 2021-05-05 ENCOUNTER — Other Ambulatory Visit: Payer: Self-pay | Admitting: Family Medicine

## 2021-05-05 DIAGNOSIS — G4700 Insomnia, unspecified: Secondary | ICD-10-CM

## 2021-05-08 MED ORDER — TEMAZEPAM 15 MG PO CAPS: ORAL_CAPSULE | ORAL | 2 refills | Status: DC

## 2021-05-08 NOTE — Telephone Encounter (Signed)
Refilled. Sched a 6 mo med ck in 3 months. Ty.

## 2021-05-08 NOTE — Telephone Encounter (Signed)
Okay to refill? It is controlled.

## 2021-05-08 NOTE — Addendum Note (Signed)
Addended by: Ames Coupe on: 05/08/2021 07:13 PM   Modules accepted: Orders

## 2021-05-09 NOTE — Telephone Encounter (Signed)
Called and scheduled appt

## 2021-05-18 DIAGNOSIS — L2089 Other atopic dermatitis: Secondary | ICD-10-CM | POA: Diagnosis not present

## 2021-05-18 DIAGNOSIS — Z872 Personal history of diseases of the skin and subcutaneous tissue: Secondary | ICD-10-CM | POA: Diagnosis not present

## 2021-05-18 DIAGNOSIS — Z85820 Personal history of malignant melanoma of skin: Secondary | ICD-10-CM | POA: Diagnosis not present

## 2021-05-18 DIAGNOSIS — Z08 Encounter for follow-up examination after completed treatment for malignant neoplasm: Secondary | ICD-10-CM | POA: Diagnosis not present

## 2021-05-18 DIAGNOSIS — S50811A Abrasion of right forearm, initial encounter: Secondary | ICD-10-CM | POA: Diagnosis not present

## 2021-05-18 DIAGNOSIS — L814 Other melanin hyperpigmentation: Secondary | ICD-10-CM | POA: Diagnosis not present

## 2021-05-18 DIAGNOSIS — B353 Tinea pedis: Secondary | ICD-10-CM | POA: Diagnosis not present

## 2021-05-18 DIAGNOSIS — D225 Melanocytic nevi of trunk: Secondary | ICD-10-CM | POA: Diagnosis not present

## 2021-05-18 DIAGNOSIS — L821 Other seborrheic keratosis: Secondary | ICD-10-CM | POA: Diagnosis not present

## 2021-05-22 ENCOUNTER — Encounter: Payer: Self-pay | Admitting: Family Medicine

## 2021-06-09 ENCOUNTER — Ambulatory Visit (INDEPENDENT_AMBULATORY_CARE_PROVIDER_SITE_OTHER): Payer: Medicare Other | Admitting: Family Medicine

## 2021-06-09 ENCOUNTER — Encounter: Payer: Self-pay | Admitting: Family Medicine

## 2021-06-09 VITALS — BP 120/64 | HR 63 | Temp 97.9°F | Ht 64.0 in | Wt 146.4 lb

## 2021-06-09 DIAGNOSIS — I10 Essential (primary) hypertension: Secondary | ICD-10-CM

## 2021-06-09 DIAGNOSIS — Z09 Encounter for follow-up examination after completed treatment for conditions other than malignant neoplasm: Secondary | ICD-10-CM | POA: Diagnosis not present

## 2021-06-09 DIAGNOSIS — R7303 Prediabetes: Secondary | ICD-10-CM | POA: Diagnosis not present

## 2021-06-09 DIAGNOSIS — E785 Hyperlipidemia, unspecified: Secondary | ICD-10-CM

## 2021-06-09 MED ORDER — ATORVASTATIN CALCIUM 20 MG PO TABS: 20.0000 mg | ORAL_TABLET | Freq: Every evening | ORAL | 3 refills | Status: DC

## 2021-06-09 MED ORDER — ATENOLOL 50 MG PO TABS: 50.0000 mg | ORAL_TABLET | Freq: Every day | ORAL | 3 refills | Status: DC

## 2021-06-09 NOTE — Progress Notes (Signed)
Chief Complaint  Patient presents with   left side of head pain     Ronnie Payne is a 81 y.o. male here for evaluation of an acute headache.  Duration: 4 days Laterality: left Quality: pulsating Severity: 1/10 Associated symptoms: none Denies weakness, vision changes, balance, difficulty w speech/swallowing.  Therapies tried: None  Hypertension Patient presents for hypertension follow up. He does monitor home blood pressures. Blood pressures ranging on average from 110-120's/60-70's. He is compliant with medication- atenolol 50 mg/d. Patient has these side effects of medication: none He is adhering to a healthy diet overall. Exercise: walking, lift wts, playing tennis No cp or sob.  Hyperlipidemia Patient presents for hyperlipidemia follow up. Currently being treated with Lipitor 20 mg/d and compliance with treatment thus far has been good. He denies myalgias. Diet/exercise as above.  The patient is not known to have coexisting coronary artery disease.  Past Medical History:  Diagnosis Date   Arthritis    Cold sore    Frequent headaches    History of skin cancer    Hyperlipidemia    Hypertension    Prediabetes    Allergies as of 06/09/2021       Reactions   Doxycycline    Unknown reaction        Medication List        Accurate as of June 09, 2021  4:28 PM. If you have any questions, ask your nurse or doctor.          acyclovir 400 MG tablet Commonly known as: ZOVIRAX TAKE 1 TABLET BY MOUTH THREE TIMES DAILY FOR 5 DAYS WHEN YOU HAVE A FLARE ON YOUR LIP   atenolol 50 MG tablet Commonly known as: TENORMIN Take 1 tablet (50 mg total) by mouth daily.   atorvastatin 20 MG tablet Commonly known as: LIPITOR Take 1 tablet (20 mg total) by mouth every evening.   temazepam 15 MG capsule Commonly known as: RESTORIL Take 1 capsule nightly as needed for sleep.        BP 120/64    Pulse 63    Temp 97.9 F (36.6 C) (Oral)    Ht 5\' 4"  (1.626 m)     Wt 146 lb 6 oz (66.4 kg)    SpO2 99%    BMI 25.13 kg/m  Gen: awake, alert, appearing stated age Eyes: PERRLA, EOMi, no injection Heart: RRR, no LE edema Lungs: CTAB, no accessory muscle use Neuro: CN2-12 grossly intact, fluent and goal-oriented speech, DTR's equal and symmetric in UE's and LE's MSK: 5/5 strength throughout, no TTP over cervical paraspinal musculature or occipital triangle region Psych: Age appropriate judgment and insight, normal affect and mood  Hyperlipidemia, unspecified hyperlipidemia type - Plan: atorvastatin (LIPITOR) 20 MG tablet, Comprehensive metabolic panel, Lipid panel  Essential hypertension - Plan: atenolol (TENORMIN) 50 MG tablet  Prediabetes - Plan: Hemoglobin A1c  Follow-up for resolved condition  Chronic, stable.  Check labs in the future.  Continue Lipitor 20 mg daily.  Counseled on diet and exercise. Chronic, stable.  Continue atenolol 50 mg daily. Check labs. He has a recent MRI of the brain that is largely unremarkable.  Symptoms have currently resolved.  Continue to monitor at this time. F/u in 6 months for med check or as needed. The pt voiced understanding and agreement to the plan.  [place], DO 06/09/21 4:28 PM

## 2021-06-09 NOTE — Patient Instructions (Addendum)
Keep the diet clean and stay active.  Give us 2-3 business days to get the results of your labs back.   Let us know if you need anything.  

## 2021-06-14 DIAGNOSIS — L2089 Other atopic dermatitis: Secondary | ICD-10-CM | POA: Diagnosis not present

## 2021-06-20 DIAGNOSIS — R350 Frequency of micturition: Secondary | ICD-10-CM | POA: Diagnosis not present

## 2021-06-20 DIAGNOSIS — N5201 Erectile dysfunction due to arterial insufficiency: Secondary | ICD-10-CM | POA: Diagnosis not present

## 2021-06-20 DIAGNOSIS — R310 Gross hematuria: Secondary | ICD-10-CM | POA: Diagnosis not present

## 2021-06-21 ENCOUNTER — Other Ambulatory Visit (INDEPENDENT_AMBULATORY_CARE_PROVIDER_SITE_OTHER): Payer: Medicare Other

## 2021-06-21 DIAGNOSIS — E785 Hyperlipidemia, unspecified: Secondary | ICD-10-CM | POA: Diagnosis not present

## 2021-06-21 DIAGNOSIS — R7303 Prediabetes: Secondary | ICD-10-CM

## 2021-06-21 LAB — HEMOGLOBIN A1C: Hgb A1c MFr Bld: 5.4 % (ref 4.6–6.5)

## 2021-06-22 DIAGNOSIS — S39012D Strain of muscle, fascia and tendon of lower back, subsequent encounter: Secondary | ICD-10-CM | POA: Diagnosis not present

## 2021-06-22 LAB — LIPID PANEL
Cholesterol: 170 mg/dL (ref 0–200)
HDL: 57 mg/dL (ref >39.00)
LDL Cholesterol: 91 mg/dL (ref 0–99)
NonHDL: 112.77
Total CHOL/HDL Ratio: 3
Triglycerides: 111 mg/dL (ref 0.0–149.0)
VLDL: 22.2 mg/dL (ref 0.0–40.0)

## 2021-06-22 LAB — COMPREHENSIVE METABOLIC PANEL
ALT: 24 U/L (ref 0–53)
AST: 22 U/L (ref 0–37)
Albumin: 4.5 g/dL (ref 3.5–5.2)
Alkaline Phosphatase: 79 U/L (ref 39–117)
BUN: 15 mg/dL (ref 6–23)
CO2: 36 mEq/L — ABNORMAL HIGH (ref 19–32)
Calcium: 9.8 mg/dL (ref 8.4–10.5)
Chloride: 102 mEq/L (ref 96–112)
Creatinine, Ser: 0.99 mg/dL (ref 0.40–1.50)
GFR: 71.76 mL/min (ref >60.00)
Glucose, Bld: 96 mg/dL (ref 70–99)
Potassium: 4.2 mEq/L (ref 3.5–5.1)
Sodium: 142 mEq/L (ref 135–145)
Total Bilirubin: 0.9 mg/dL (ref 0.2–1.2)
Total Protein: 6.5 g/dL (ref 6.0–8.3)

## 2021-06-27 DIAGNOSIS — S39012D Strain of muscle, fascia and tendon of lower back, subsequent encounter: Secondary | ICD-10-CM | POA: Diagnosis not present

## 2021-06-28 ENCOUNTER — Emergency Department (HOSPITAL_COMMUNITY): Payer: Medicare Other

## 2021-06-28 ENCOUNTER — Encounter (HOSPITAL_COMMUNITY): Payer: Self-pay | Admitting: Emergency Medicine

## 2021-06-28 ENCOUNTER — Observation Stay (HOSPITAL_COMMUNITY)
Admission: EM | Admit: 2021-06-28 | Discharge: 2021-06-29 | Disposition: A | Discharge status: Home / Self Care | Payer: Medicare Other | Attending: Internal Medicine | Admitting: Internal Medicine

## 2021-06-28 ENCOUNTER — Other Ambulatory Visit: Payer: Self-pay

## 2021-06-28 DIAGNOSIS — S060X1A Concussion with loss of consciousness of 30 minutes or less, initial encounter: Principal | ICD-10-CM | POA: Insufficient documentation

## 2021-06-28 DIAGNOSIS — Z85820 Personal history of malignant melanoma of skin: Secondary | ICD-10-CM | POA: Diagnosis not present

## 2021-06-28 DIAGNOSIS — W19XXXA Unspecified fall, initial encounter: Secondary | ICD-10-CM

## 2021-06-28 DIAGNOSIS — R7303 Prediabetes: Secondary | ICD-10-CM | POA: Diagnosis not present

## 2021-06-28 DIAGNOSIS — S0191XA Laceration without foreign body of unspecified part of head, initial encounter: Secondary | ICD-10-CM | POA: Insufficient documentation

## 2021-06-28 DIAGNOSIS — Z96642 Presence of left artificial hip joint: Secondary | ICD-10-CM | POA: Insufficient documentation

## 2021-06-28 DIAGNOSIS — Z043 Encounter for examination and observation following other accident: Secondary | ICD-10-CM | POA: Diagnosis not present

## 2021-06-28 DIAGNOSIS — M1611 Unilateral primary osteoarthritis, right hip: Secondary | ICD-10-CM | POA: Diagnosis not present

## 2021-06-28 DIAGNOSIS — I10 Essential (primary) hypertension: Secondary | ICD-10-CM | POA: Diagnosis not present

## 2021-06-28 DIAGNOSIS — Z79899 Other long term (current) drug therapy: Secondary | ICD-10-CM | POA: Insufficient documentation

## 2021-06-28 DIAGNOSIS — E785 Hyperlipidemia, unspecified: Secondary | ICD-10-CM

## 2021-06-28 DIAGNOSIS — Z20822 Contact with and (suspected) exposure to covid-19: Secondary | ICD-10-CM | POA: Diagnosis not present

## 2021-06-28 DIAGNOSIS — S060X9A Concussion with loss of consciousness of unspecified duration, initial encounter: Secondary | ICD-10-CM | POA: Diagnosis not present

## 2021-06-28 DIAGNOSIS — R4182 Altered mental status, unspecified: Secondary | ICD-10-CM | POA: Diagnosis not present

## 2021-06-28 DIAGNOSIS — F0781 Postconcussional syndrome: Secondary | ICD-10-CM | POA: Diagnosis not present

## 2021-06-28 DIAGNOSIS — S0101XA Laceration without foreign body of scalp, initial encounter: Secondary | ICD-10-CM | POA: Diagnosis not present

## 2021-06-28 DIAGNOSIS — M2578 Osteophyte, vertebrae: Secondary | ICD-10-CM | POA: Diagnosis not present

## 2021-06-28 DIAGNOSIS — R4020 Unspecified coma: Secondary | ICD-10-CM | POA: Diagnosis not present

## 2021-06-28 DIAGNOSIS — R9431 Abnormal electrocardiogram [ECG] [EKG]: Secondary | ICD-10-CM | POA: Diagnosis not present

## 2021-06-28 DIAGNOSIS — S0990XA Unspecified injury of head, initial encounter: Secondary | ICD-10-CM | POA: Diagnosis present

## 2021-06-28 DIAGNOSIS — W108XXA Fall (on) (from) other stairs and steps, initial encounter: Secondary | ICD-10-CM | POA: Insufficient documentation

## 2021-06-28 LAB — CBC WITH DIFFERENTIAL/PLATELET
Abs Immature Granulocytes: 0.09 10*3/uL — ABNORMAL HIGH (ref 0.00–0.07)
Basophils Absolute: 0 10*3/uL (ref 0.0–0.1)
Basophils Relative: 0 %
Eosinophils Absolute: 0.1 10*3/uL (ref 0.0–0.5)
Eosinophils Relative: 2 %
HCT: 42.8 % (ref 39.0–52.0)
Hemoglobin: 14.7 g/dL (ref 13.0–17.0)
Immature Granulocytes: 1 %
Lymphocytes Relative: 17 %
Lymphs Abs: 1.4 10*3/uL (ref 0.7–4.0)
MCH: 31.5 pg (ref 26.0–34.0)
MCHC: 34.3 g/dL (ref 30.0–36.0)
MCV: 91.6 fL (ref 80.0–100.0)
Monocytes Absolute: 0.8 10*3/uL (ref 0.1–1.0)
Monocytes Relative: 9 %
Neutro Abs: 5.8 10*3/uL (ref 1.7–7.7)
Neutrophils Relative %: 71 %
Platelets: 156 10*3/uL (ref 150–400)
RBC: 4.67 MIL/uL (ref 4.22–5.81)
RDW: 12.8 % (ref 11.5–15.5)
WBC: 8.2 10*3/uL (ref 4.0–10.5)
nRBC: 0 % (ref 0.0–0.2)

## 2021-06-28 LAB — COMPREHENSIVE METABOLIC PANEL
ALT: 28 U/L (ref 0–44)
AST: 22 U/L (ref 15–41)
Albumin: 3.7 g/dL (ref 3.5–5.0)
Alkaline Phosphatase: 67 U/L (ref 38–126)
Anion gap: 8 (ref 5–15)
BUN: 20 mg/dL (ref 8–23)
CO2: 28 mmol/L (ref 22–32)
Calcium: 8.8 mg/dL — ABNORMAL LOW (ref 8.9–10.3)
Chloride: 98 mmol/L (ref 98–111)
Creatinine, Ser: 0.87 mg/dL (ref 0.61–1.24)
GFR, Estimated: >60 mL/min (ref >60)
Glucose, Bld: 114 mg/dL — ABNORMAL HIGH (ref 70–99)
Potassium: 3.9 mmol/L (ref 3.5–5.1)
Sodium: 134 mmol/L — ABNORMAL LOW (ref 135–145)
Total Bilirubin: 0.9 mg/dL (ref 0.3–1.2)
Total Protein: 5.8 g/dL — ABNORMAL LOW (ref 6.5–8.1)

## 2021-06-28 LAB — RESP PANEL BY RT-PCR (FLU A&B, COVID) ARPGX2
Influenza A by PCR: NEGATIVE
Influenza B by PCR: NEGATIVE
SARS Coronavirus 2 by RT PCR: NEGATIVE

## 2021-06-28 IMAGING — CT CT CERVICAL SPINE W/O CM
3 of 4 series · 12 of 33 positions shown, 14 images · non-contrast
Comparison: None.

CLINICAL DATA: Head trauma, abnormal mental status (Age 18-64y);
Neck trauma (Age >= 65y)



[Series 8: sag bone · sagittal · 0.27mm/px · 5 of 64 slices shown, 6 images]
[im 22/64  bone]
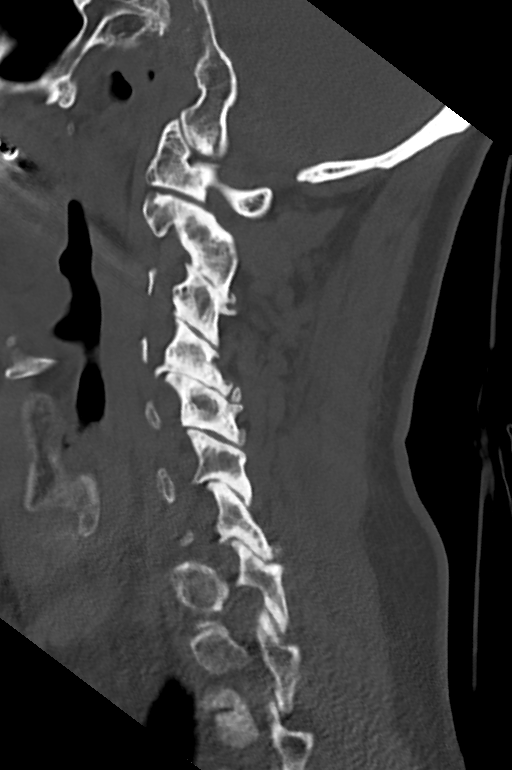
[im 27/64  bone]
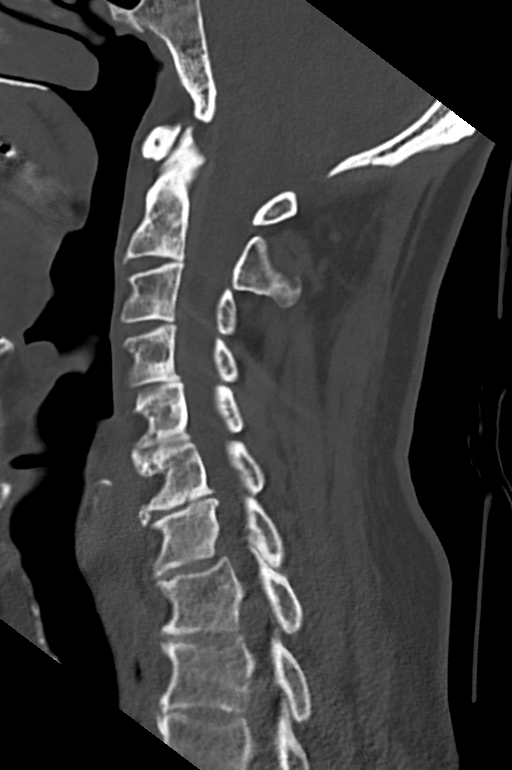
[im 32/64  soft-tissue]
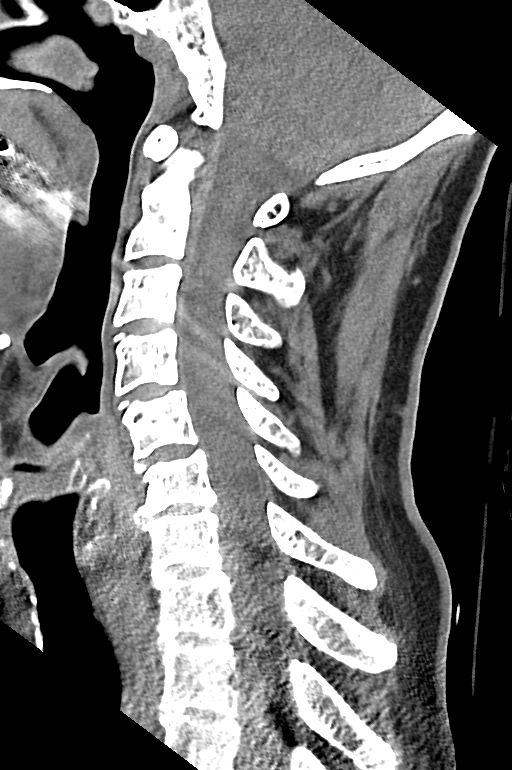
[im 32/64  bone]
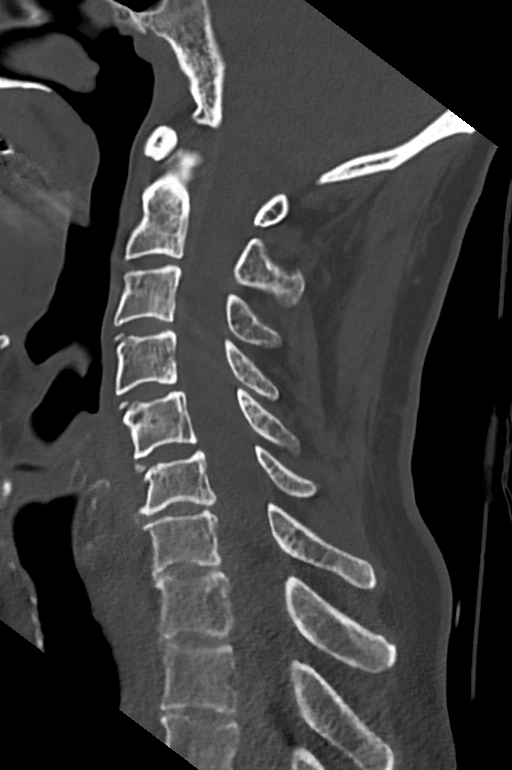
[im 37/64  bone]
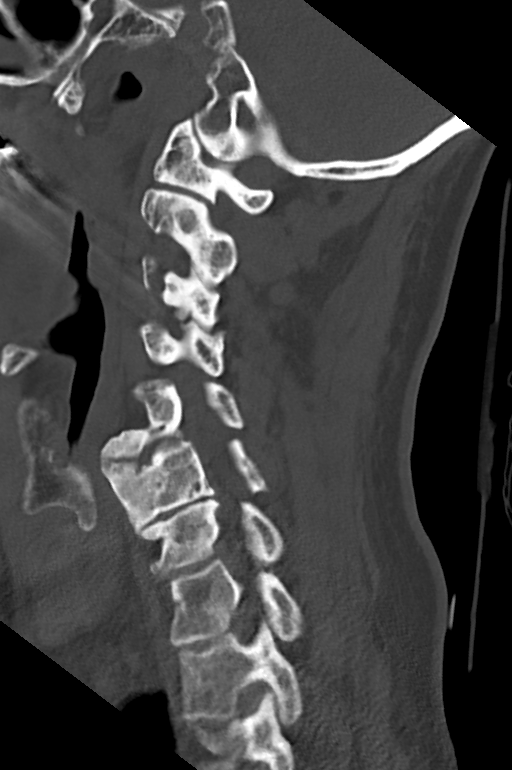
[im 43/64  bone]
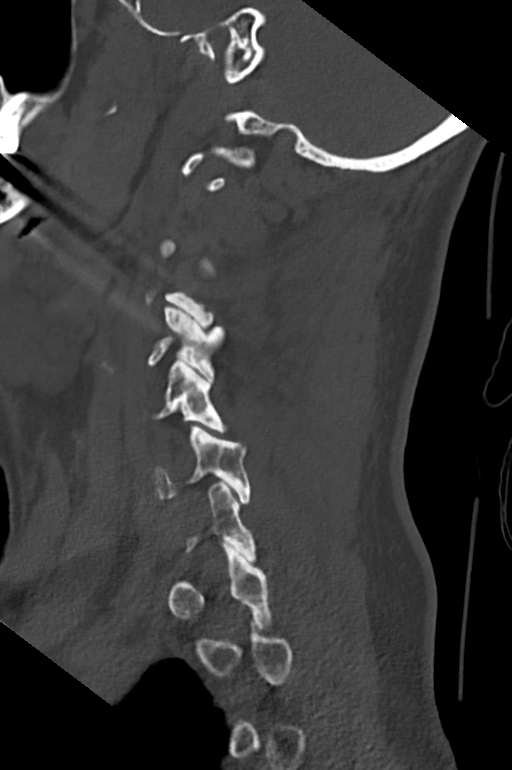

[Series 9: cor bone · coronal · 0.24mm/px · 3 of 59 slices shown]
[im 16/59  bone]
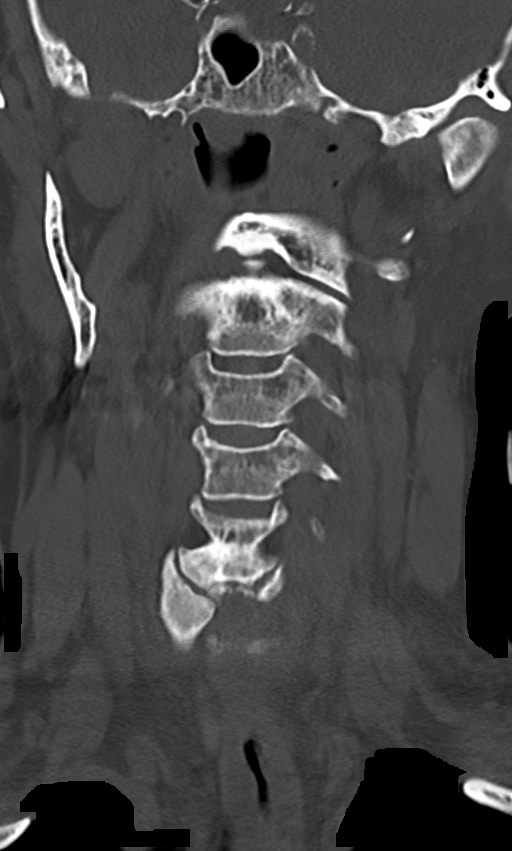
[im 25/59  bone]
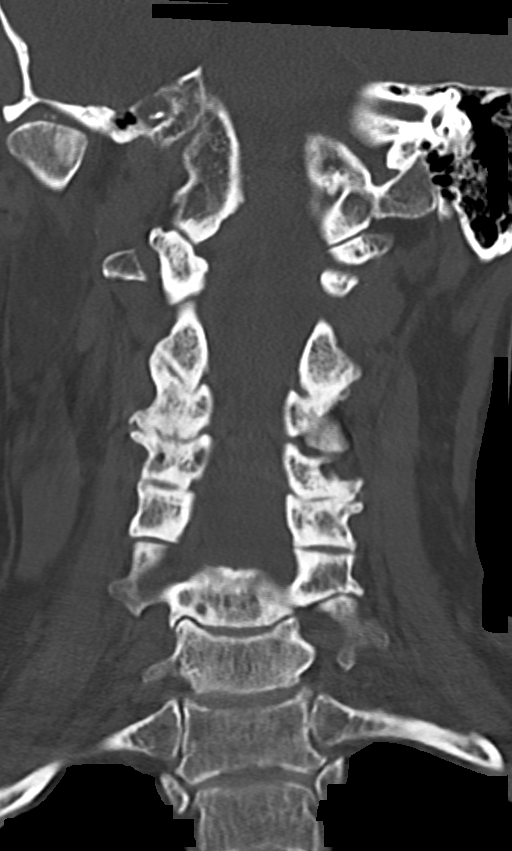
[im 34/59  bone]
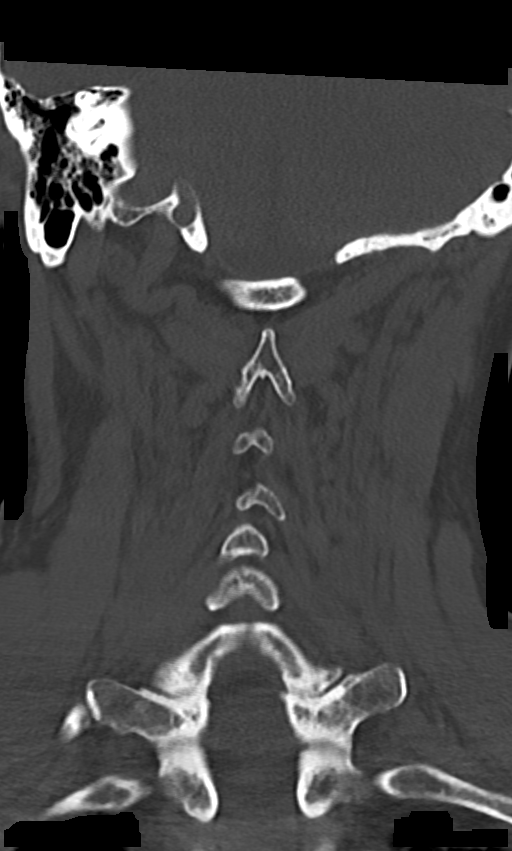

[Series 10: orthogonal axials · axial · 0.21mm/px · z∈[-275,-174]mm · 4 of 95 slices shown, 5 images]
[im 16/95  soft-tissue]
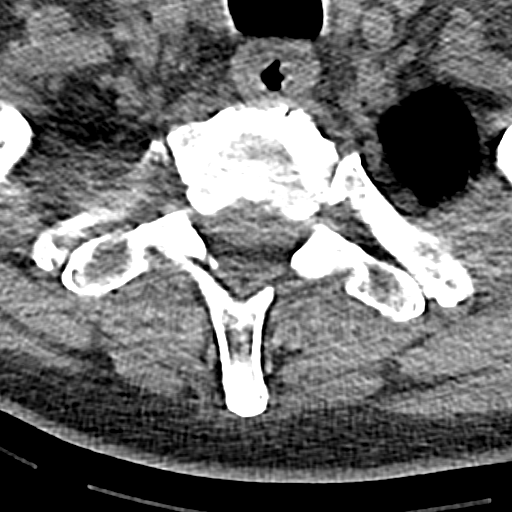
[im 16/95  bone]
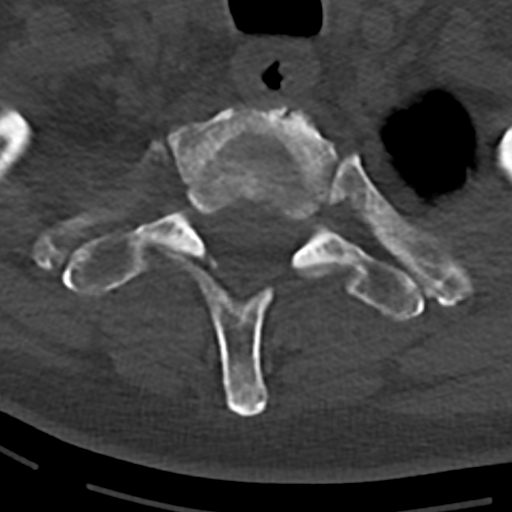
[im 32/95  bone]
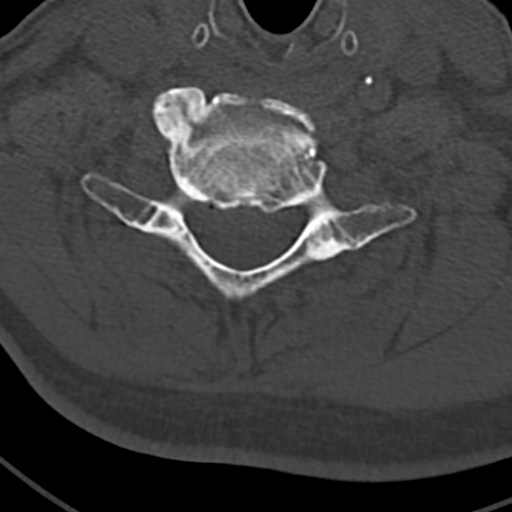
[im 63/95  bone]
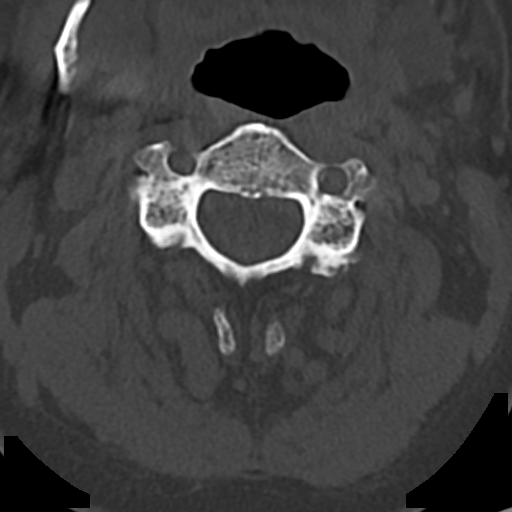
[im 79/95  bone]
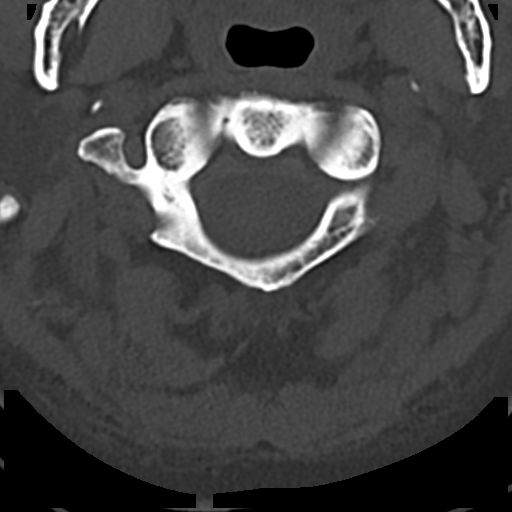

[12 of 33 positions shown; findings below may reference images not displayed]

FINDINGS: CT HEAD FINDINGS

Brain: No evidence of acute infarction, hemorrhage, hydrocephalus,
extra-axial collection or mass lesion/mass effect. Mild patchy white
matter hypoattenuation, nonspecific but compatible with chronic
microvascular ischemic disease.

Vascular: Calcific intracranial atherosclerosis. No hyperdense
vessel identified.

Skull: High left posterior scalp contusion without acute fracture.

Sinuses/Orbits: Clear visualized sinuses. Unremarkable orbits.

Other: No mastoid effusions.

CT CERVICAL SPINE FINDINGS

Alignment: Slight anterolisthesis of C4 on C5 and C5 on C6, favored
degenerative in etiology given degenerative changes at these levels.
Mild broad dextrocurvature.

Skull base and vertebrae: No evidence of acute fracture. Benign
limbus vertebra at multiple levels. Nonspecific sclerotic T1
vertebral body lesion.

Soft tissues and spinal canal: No prevertebral fluid or swelling. No
visible canal hematoma.

Disc levels: Bulky anteriorly directed right-sided bridging
osteophyte at C5-C6. Multilevel degenerative disease with disc
height loss and endplate spurring. Multilevel facet and
uncovertebral hypertrophy with varying degrees of neural foraminal
stenosis.

Upper chest: Visualized lung apices are clear.
IMPRESSION: CT head:

1. No evidence of acute intracranial abnormality.
2. High left posterior scalp contusion without acute fracture.

CT cervical spine:

1. No evidence of acute fracture or traumatic malalignment.
2. Nonspecific sclerotic T1 vertebral body lesion. This most likely
represents a benign bone island, but if the patient has a known
primary malignancy then consider MRI with contrast or bone scan to
assess for osseous metastatic disease.

## 2021-06-28 IMAGING — DX DG PORTABLE PELVIS
1 series · 1 of 1 positions shown · non-contrast
Comparison: Coronal imaging CT abdomen pelvis [DATE].

CLINICAL DATA: Fall, initial encounter.

EXAM:
PORTABLE PELVIS 1-2 VIEWS

[pelvis ap]
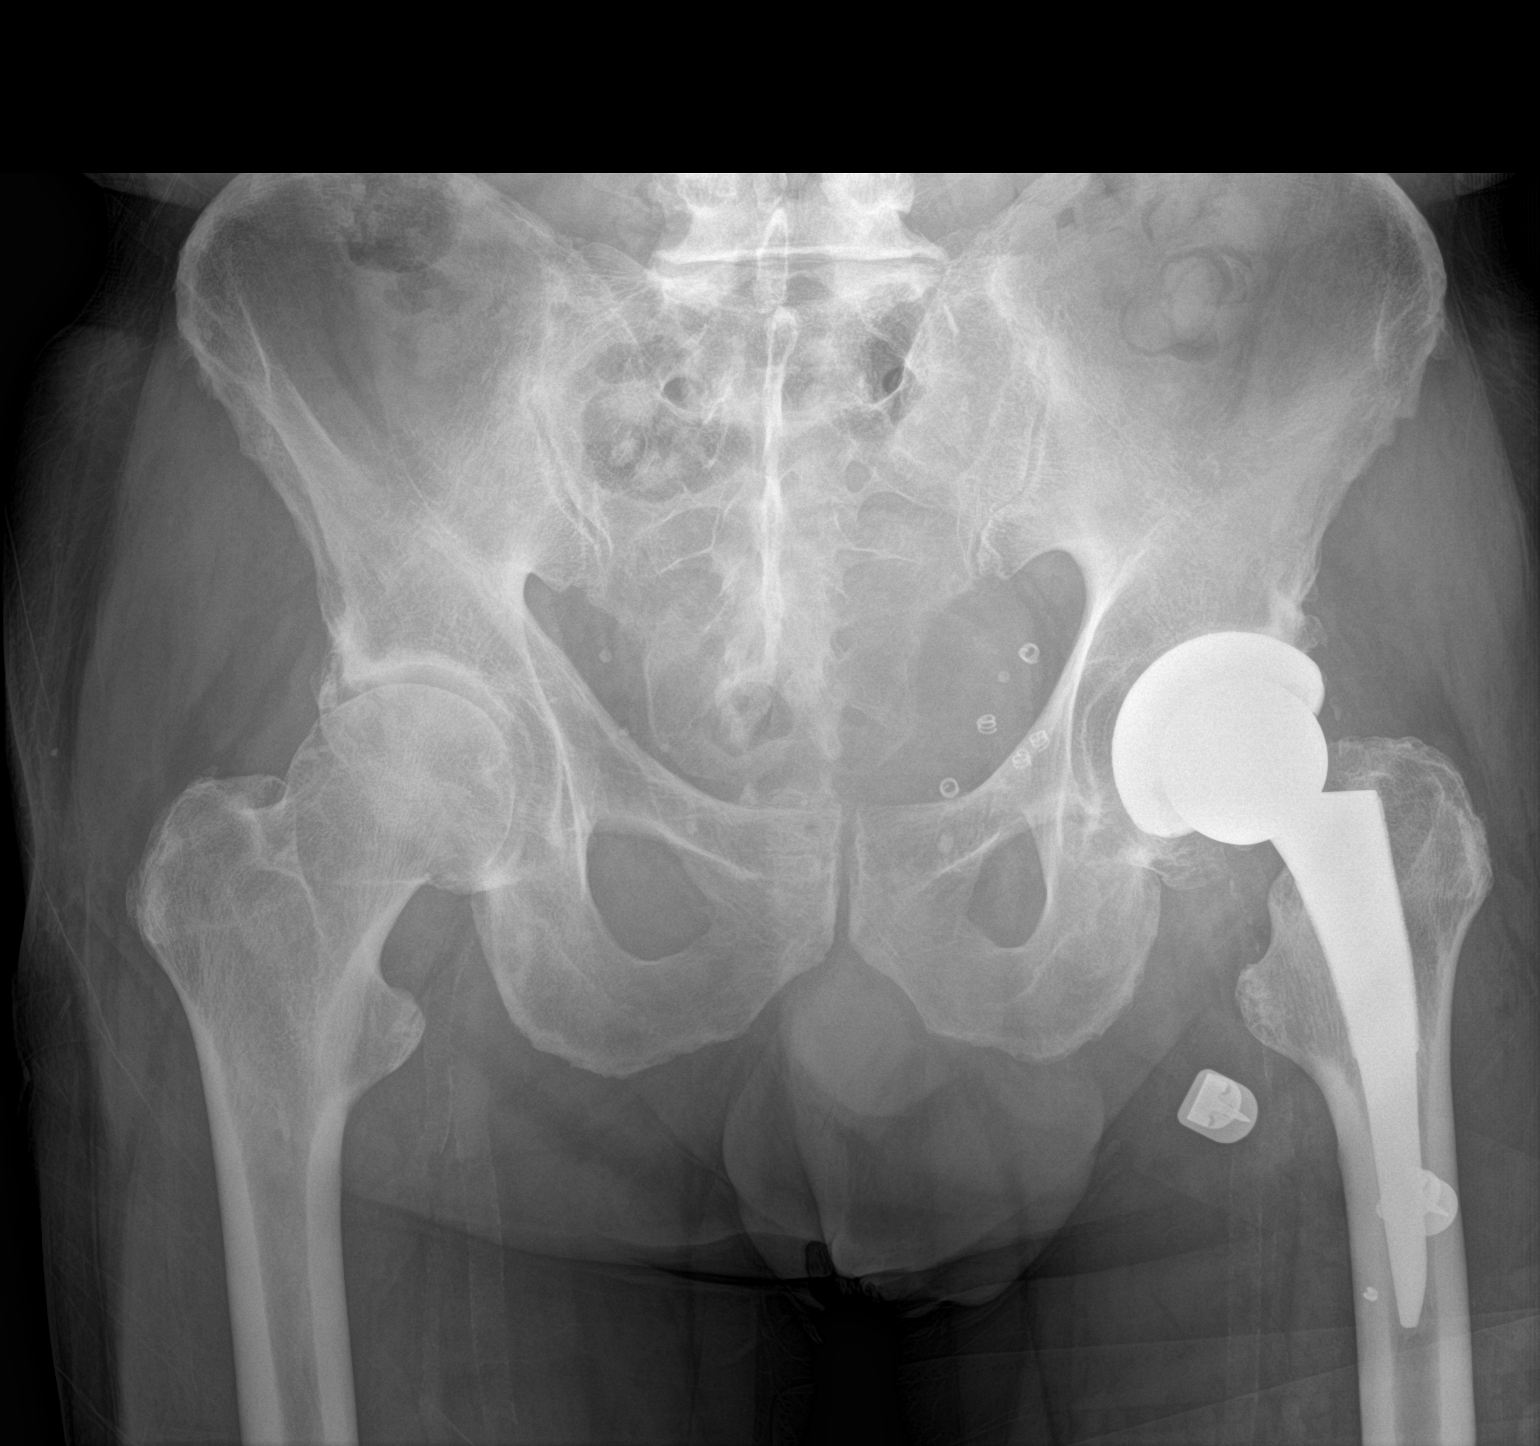

[1 of 1 positions shown; findings below may reference images not displayed]

FINDINGS: Left hip arthroplasty. No fracture or dislocation. Subchondral
sclerosis and osteophytosis in the right hip. Degenerative changes
in the visualized spine.
IMPRESSION: 1. No acute findings.
2. Mild right hip osteoarthritis.

## 2021-06-28 IMAGING — CT CT HEAD W/O CM
4 series · 16 of 47 positions shown, 18 images · non-contrast
Comparison: None.

CLINICAL DATA: Head trauma, abnormal mental status (Age 18-64y);
Neck trauma (Age >= 65y)



[Series 3: head wo · axial · 0.40mm/px · z∈[-135,-25]mm · 6 of 32 slices shown, 8 images]
[im 5/32  brain]
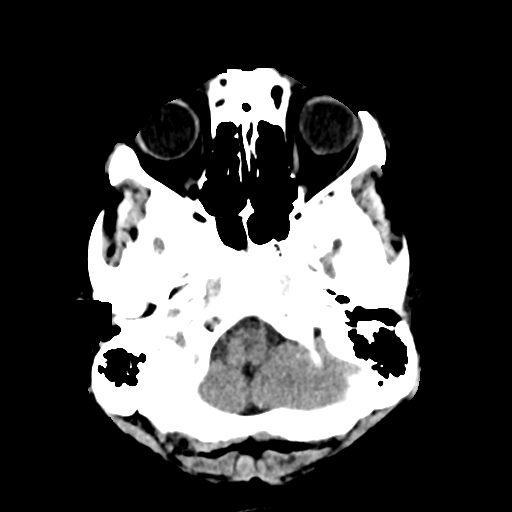
[im 5/32  bone]
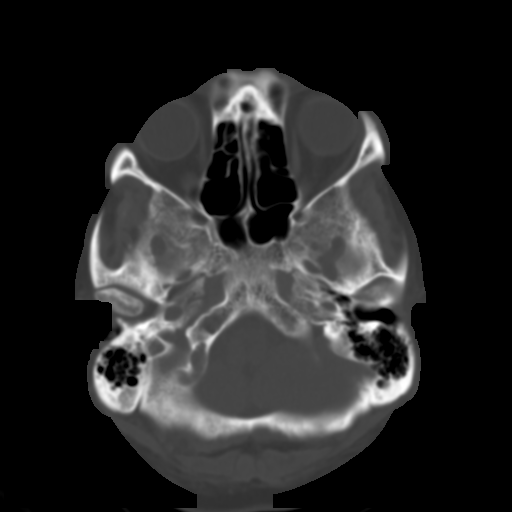
[im 9/32  brain]
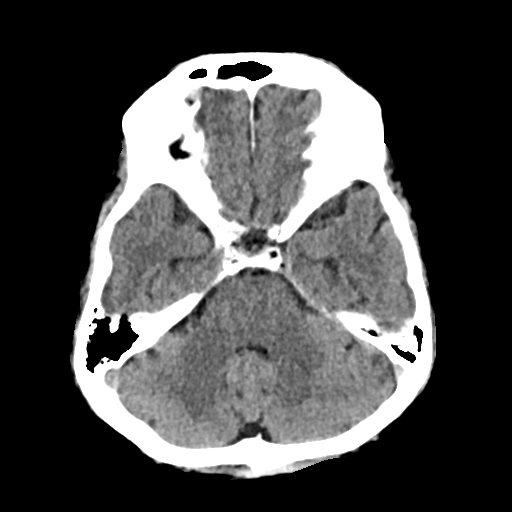
[im 14/32  brain]
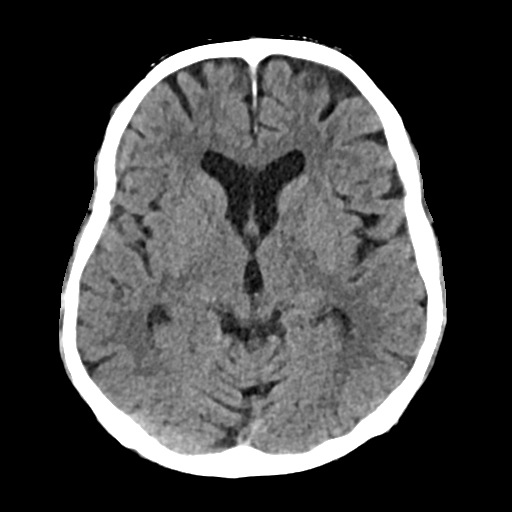
[im 18/32  brain]
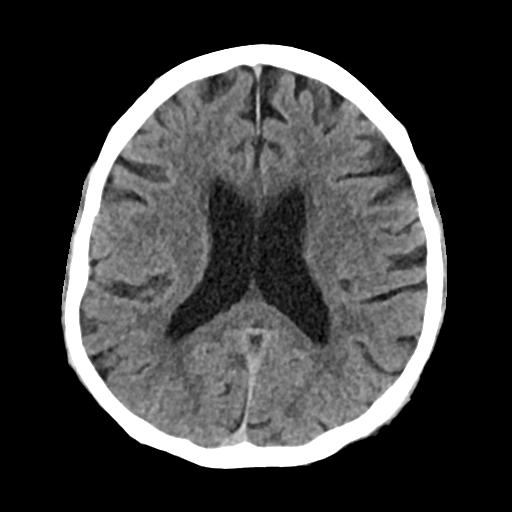
[im 23/32  brain]
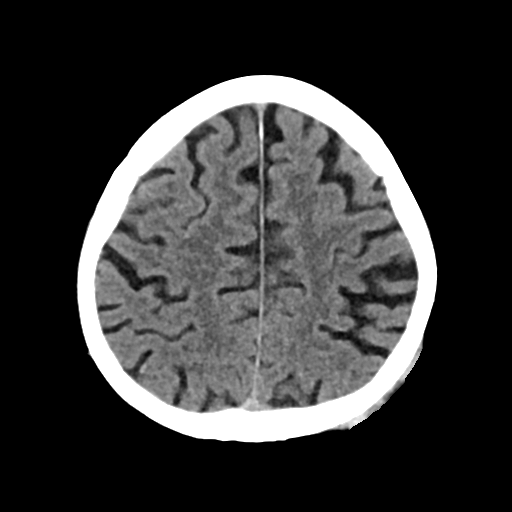
[im 23/32  bone]
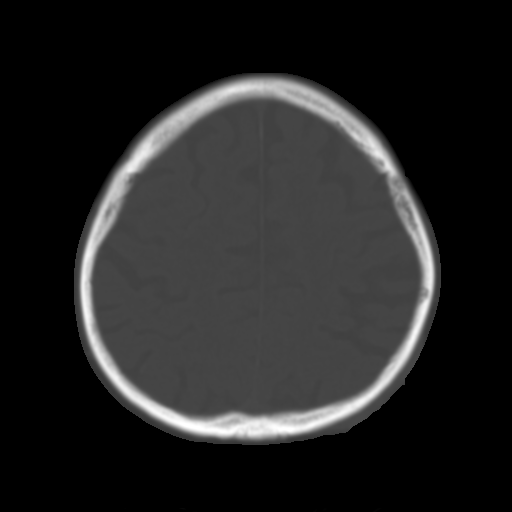
[im 27/32  brain]
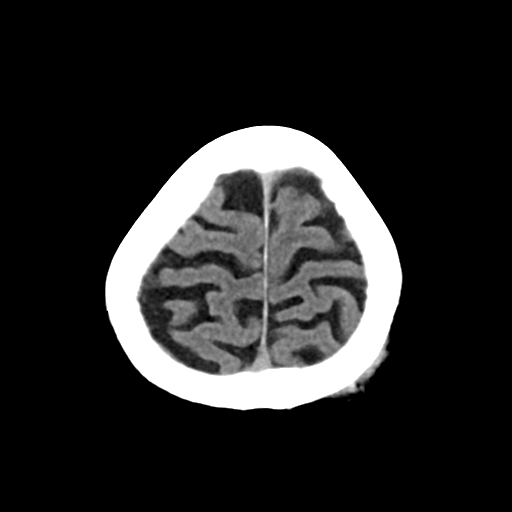

[Series 4: head bone · axial · 0.40mm/px · z∈[-141,-87]mm · 4 of 82 slices shown]
[im 8/82  bone]
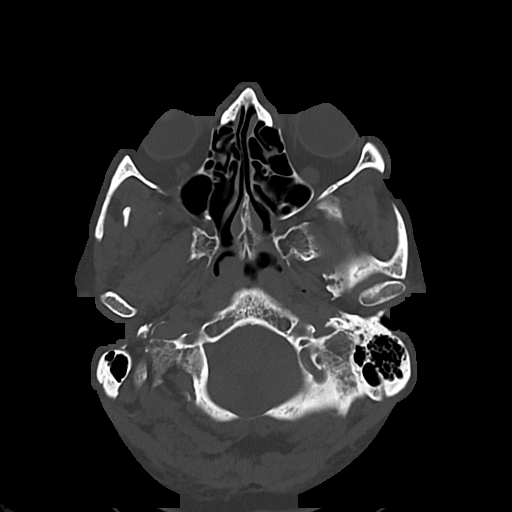
[im 16/82  bone]
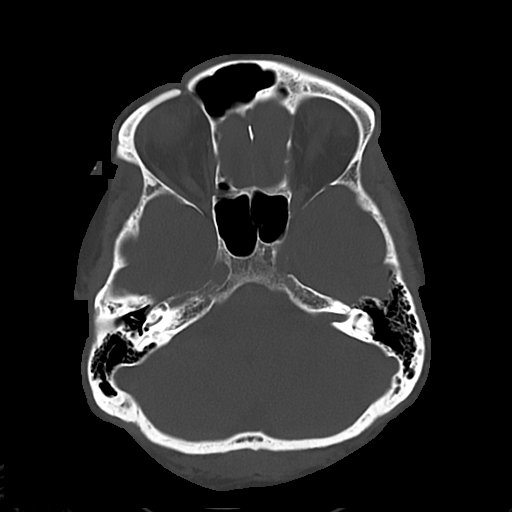
[im 28/82  bone]
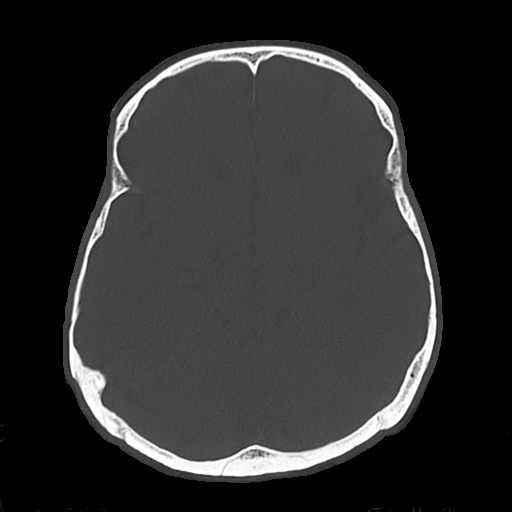
[im 35/82  bone]
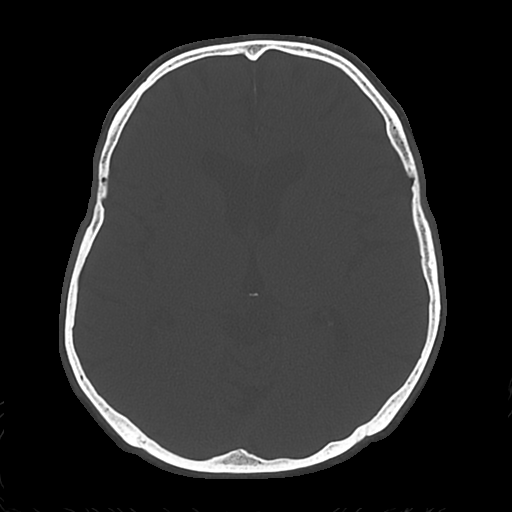

[Series 5: cor soft · coronal · 0.33mm/px · 3 of 66 slices shown]
[im 22/66  brain]
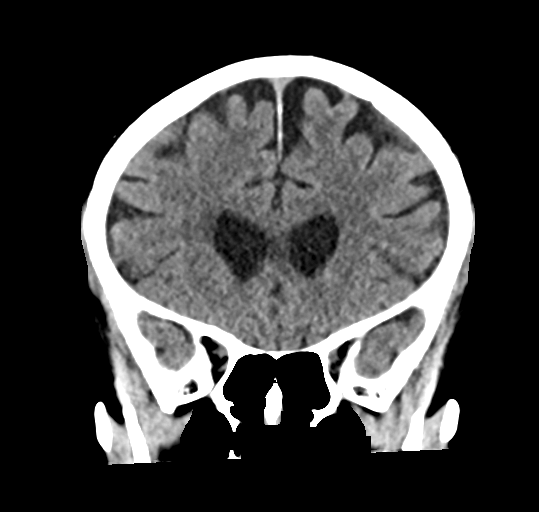
[im 29/66  brain]
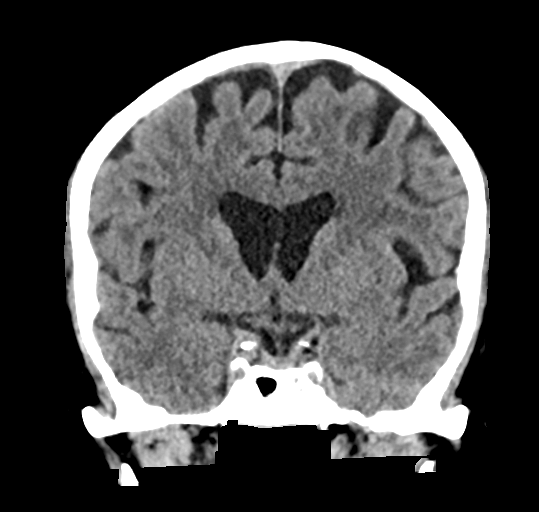
[im 37/66  brain]
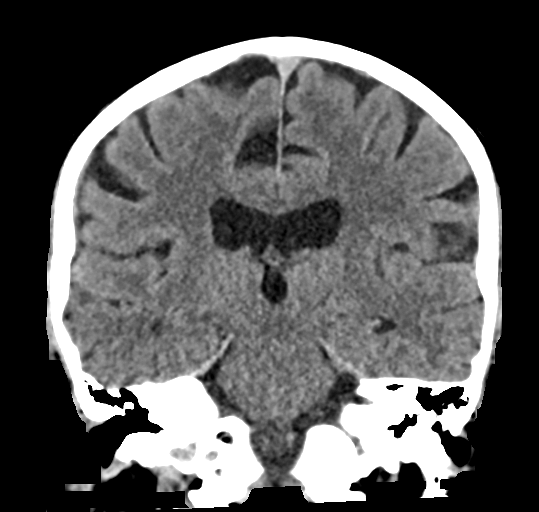

[Series 6: sag soft · sagittal · 0.34mm/px · 3 of 60 slices shown]
[im 20/60  brain]
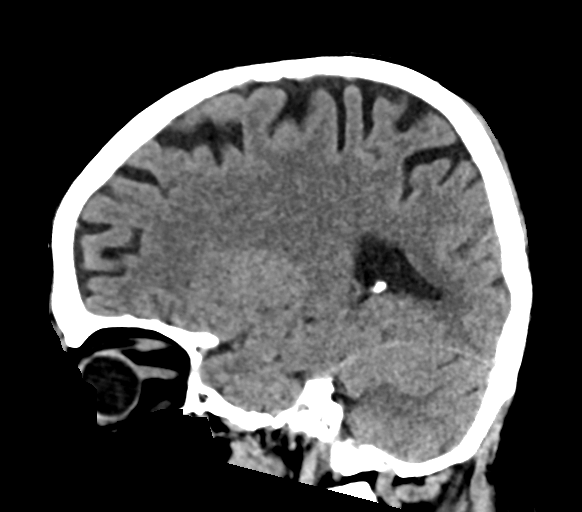
[im 30/60  brain]
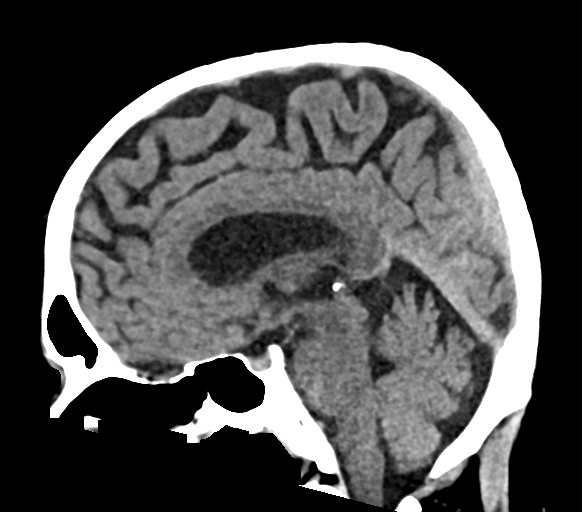
[im 40/60  brain]
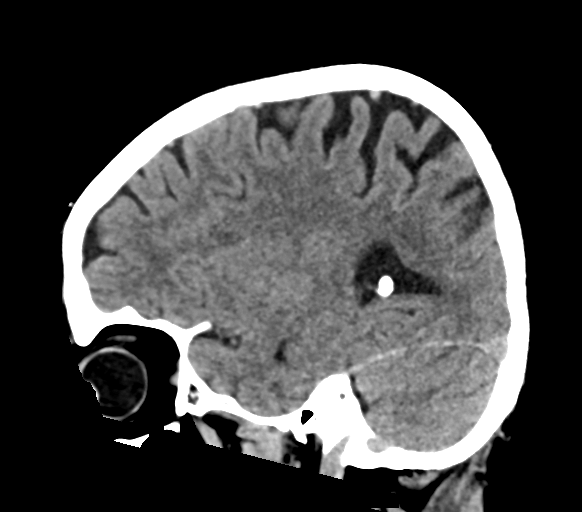

[16 of 47 positions shown; findings below may reference images not displayed]

FINDINGS: CT HEAD FINDINGS

Brain: No evidence of acute infarction, hemorrhage, hydrocephalus,
extra-axial collection or mass lesion/mass effect. Mild patchy white
matter hypoattenuation, nonspecific but compatible with chronic
microvascular ischemic disease.

Vascular: Calcific intracranial atherosclerosis. No hyperdense
vessel identified.

Skull: High left posterior scalp contusion without acute fracture.

Sinuses/Orbits: Clear visualized sinuses. Unremarkable orbits.

Other: No mastoid effusions.

CT CERVICAL SPINE FINDINGS

Alignment: Slight anterolisthesis of C4 on C5 and C5 on C6, favored
degenerative in etiology given degenerative changes at these levels.
Mild broad dextrocurvature.

Skull base and vertebrae: No evidence of acute fracture. Benign
limbus vertebra at multiple levels. Nonspecific sclerotic T1
vertebral body lesion.

Soft tissues and spinal canal: No prevertebral fluid or swelling. No
visible canal hematoma.

Disc levels: Bulky anteriorly directed right-sided bridging
osteophyte at C5-C6. Multilevel degenerative disease with disc
height loss and endplate spurring. Multilevel facet and
uncovertebral hypertrophy with varying degrees of neural foraminal
stenosis.

Upper chest: Visualized lung apices are clear.
IMPRESSION: CT head:

1. No evidence of acute intracranial abnormality.
2. High left posterior scalp contusion without acute fracture.

CT cervical spine:

1. No evidence of acute fracture or traumatic malalignment.
2. Nonspecific sclerotic T1 vertebral body lesion. This most likely
represents a benign bone island, but if the patient has a known
primary malignancy then consider MRI with contrast or bone scan to
assess for osseous metastatic disease.

## 2021-06-28 MED ORDER — ONDANSETRON HCL 4 MG/2ML IJ SOLN: 4.0000 mg | Freq: Four times a day (QID) | INTRAMUSCULAR | Status: DC | PRN

## 2021-06-28 MED ORDER — NAPROXEN SODIUM 220 MG PO TABS: 220.0000 mg | ORAL_TABLET | Freq: Two times a day (BID) | ORAL | Status: DC | PRN

## 2021-06-28 MED ORDER — LIDOCAINE HCL 2 % IJ SOLN
10.0000 mL | Freq: Once | INTRAMUSCULAR | Status: AC
  Administered 2021-06-28: 200 mg
  Filled 2021-06-28: qty 20

## 2021-06-28 MED ORDER — MIRABEGRON ER 25 MG PO TB24
50.0000 mg | ORAL_TABLET | Freq: Every day | ORAL | Status: DC
  Administered 2021-06-29: 50 mg via ORAL
  Filled 2021-06-28: qty 2

## 2021-06-28 MED ORDER — ACETAMINOPHEN 325 MG PO TABS
650.0000 mg | ORAL_TABLET | Freq: Four times a day (QID) | ORAL | Status: DC | PRN
  Administered 2021-06-28 – 2021-06-29 (×3): 650 mg via ORAL
  Filled 2021-06-28 (×3): qty 2

## 2021-06-28 MED ORDER — POLYVINYL ALCOHOL 1.4 % OP SOLN
1.0000 [drp] | Freq: Three times a day (TID) | OPHTHALMIC | Status: DC | PRN
  Filled 2021-06-28: qty 15

## 2021-06-28 MED ORDER — ONDANSETRON HCL 4 MG PO TABS: 4.0000 mg | ORAL_TABLET | Freq: Four times a day (QID) | ORAL | Status: DC | PRN

## 2021-06-28 MED ORDER — MELATONIN 3 MG PO TABS: 3.0000 mg | ORAL_TABLET | Freq: Every evening | ORAL | Status: DC | PRN

## 2021-06-28 MED ORDER — ATENOLOL 25 MG PO TABS
50.0000 mg | ORAL_TABLET | Freq: Every day | ORAL | Status: DC
  Administered 2021-06-29: 50 mg via ORAL
  Filled 2021-06-28: qty 2

## 2021-06-28 MED ORDER — ALBUTEROL SULFATE (2.5 MG/3ML) 0.083% IN NEBU: 2.5000 mg | INHALATION_SOLUTION | Freq: Four times a day (QID) | RESPIRATORY_TRACT | Status: DC | PRN

## 2021-06-28 MED ORDER — NAPROXEN 250 MG PO TABS
250.0000 mg | ORAL_TABLET | Freq: Two times a day (BID) | ORAL | Status: DC | PRN
Start: 2021-06-28 — End: 2021-06-29
  Filled 2021-06-28: qty 1

## 2021-06-28 MED ORDER — ATORVASTATIN CALCIUM 10 MG PO TABS
20.0000 mg | ORAL_TABLET | Freq: Every evening | ORAL | Status: DC
  Administered 2021-06-28: 20 mg via ORAL
  Filled 2021-06-28: qty 2

## 2021-06-28 MED ORDER — SODIUM CHLORIDE 0.9% FLUSH
3.0000 mL | Freq: Two times a day (BID) | INTRAVENOUS | Status: DC
  Administered 2021-06-28 – 2021-06-29 (×2): 3 mL via INTRAVENOUS

## 2021-06-28 MED ORDER — ENOXAPARIN SODIUM 40 MG/0.4ML IJ SOSY
40.0000 mg | PREFILLED_SYRINGE | INTRAMUSCULAR | Status: DC
  Administered 2021-06-28: 40 mg via SUBCUTANEOUS
  Filled 2021-06-28: qty 0.4

## 2021-06-28 NOTE — ED Notes (Signed)
Pt is repeatedly asking where he is and why he is here.  Increased swelling noted to posterior and very small amount of blood noted on towel under head.

## 2021-06-28 NOTE — Assessment & Plan Note (Signed)
Home medication regimen includes atorvastatin 20 mg q. Evening. -Continue atorvastatin

## 2021-06-28 NOTE — Assessment & Plan Note (Addendum)
Patient fell down the steps this morning.  Is not totally clear what led to the fall, but wife thinks that he possibly slipped.  Imaging did not note any acute abnormality.  Scalp laceration. -Up with assistance -Bed alarm on -Check orthostatic vital signs in a.m. -Physical therapy to evaluate for safety -Follow-up telemetry overnight

## 2021-06-28 NOTE — ED Notes (Signed)
Hospitalist at beside 

## 2021-06-28 NOTE — Assessment & Plan Note (Addendum)
Blood pressures currently maintained 148/69.  Home blood pressure regimen includes atenolol 50 mg daily.  Patient had taken his medication for today. -Continue atenolol in a.m.

## 2021-06-28 NOTE — Assessment & Plan Note (Addendum)
Following the fall the patient has continued to be altered with complaints of headache, dizziness, andshort-term memory loss.  CT scan of the head noted no intracranial abnormality. -Admit to a medical telemetry bed -Neuro checks -Delirium precautions -Tylenol as needed for headache -Consider need of repeat CT scan for any neurological changes overnight -Held diazepam as needed for sleep as question the possibility of medication making current symptoms worse.  Replaced with melatonin if needed for sleep

## 2021-06-28 NOTE — Assessment & Plan Note (Signed)
Secondary to fall.  Closure performed by ED provider with 3 staples.  -Follow-up in outpatient setting with PCP in 7 to 10 days to have staples removed

## 2021-06-28 NOTE — ED Triage Notes (Signed)
Pt BIB [facility] for fall down stairs, pt has laceration to back of head, vitals 56 hR, 98 o2 RA, 157/66 (94) , rr 13

## 2021-06-28 NOTE — Progress Notes (Signed)
Orthopedic Tech Progress Note Patient Details:  Ronnie Payne November 22, 1940 [id number]  Level 2 trauma earlier   Patient ID: [name], male   DOB: 04/26/1941, 81 y.o.   MRN: [id number]  [name] 06/28/2021, 3:04 PM

## 2021-06-28 NOTE — ED Provider Notes (Signed)
[facility] EMERGENCY DEPARTMENT Provider Note   CSN: [id number] Arrival date & time: 06/28/21  1151     History  Chief Complaint  Patient presents with   Lytle Michaels    Ronnie Payne is a 81 y.o. male.  Pt's wife reports pt fell down stairs.  Pt hit the back of his head.  She reports pt was unconscious.     Fall  Trauma Mechanism of injury: Fall Injury location: head/neck Injury location detail: scalp Incident location: home Time since incident: 30 minutes Arrived directly from scene: yes   Fall:      Fall occurred: down stairs      Height of fall: 4 foot      Impact surface: unknown      Point of impact: head      Entrapped after fall: no  Protective equipment:       None  EMS/PTA data:      Bystander interventions: wound care      Blood loss: minimal      Responsiveness: alert      Oriented to: person      Loss of consciousness: yes      Loss of consciousness duration: 30 seconds      Amnesic to event: yes      Airway interventions: none      Breathing interventions: none      IV access: established      IO access: none      Fluids administered: none      Cardiac interventions: none      Medications administered: none      Immobilization: C-collar      Airway condition since incident: stable      Breathing condition since incident: stable      Circulation condition since incident: stable      Mental status condition since incident: worsening  Current symptoms:      Associated symptoms:            Reports loss of consciousness.   Relevant PMH:      Medical risk factors:            No past MI or diabetes.       Pharmacological risk factors:            No anticoagulation therapy.       Tetanus status: UTD (2020)      The patient has not been admitted to the hospital due to injury in the past year.     Home Medications Prior to Admission medications   Medication Sig Start Date End Date Taking? Authorizing Provider  Ascorbic  Acid (VITAMIN C PO) Take 1 tablet by mouth daily.   Yes [provider]  atenolol (TENORMIN) 50 MG tablet Take 1 tablet (50 mg total) by mouth daily. 06/09/21  Yes Shelda Pal, DO  atorvastatin (LIPITOR) 20 MG tablet Take 1 tablet (20 mg total) by mouth every evening. 06/09/21  Yes Shelda Pal, DO  carboxymethylcellulose (REFRESH PLUS) 0.5 % SOLN Place 1 drop into both eyes 3 (three) times daily as needed (dry eyes).   Yes [provider]  Cholecalciferol (VITAMIN D3 PO) Take 1 tablet by mouth daily.   Yes [provider]  MYRBETRIQ 50 MG TB24 tablet Take 50 mg by mouth daily at 12 noon. 06/19/21  Yes [provider]  naproxen sodium (ALEVE) 220 MG tablet Take 220 mg by mouth 2 (two) times daily as needed (back  pain).   Yes [provider]  temazepam (RESTORIL) 15 MG capsule Take 1 capsule nightly as needed for sleep. Patient taking differently: Take 15 mg by mouth at bedtime as needed for sleep. 05/08/21  Yes Wendling, Crosby Oyster, DO  acyclovir (ZOVIRAX) 400 MG tablet TAKE 1 TABLET BY MOUTH THREE TIMES DAILY FOR 5 DAYS WHEN YOU HAVE A FLARE ON YOUR LIP Patient not taking: Reported on 06/28/2021 12/14/19   Shelda Pal, DO      Allergies    Doxycycline and Tape    Review of Systems   Review of Systems  Neurological:  Positive for loss of consciousness.  Psychiatric/Behavioral:  Positive for confusion.   All other systems reviewed and are negative.  Physical Exam Updated Vital Signs BP (!) 148/69    Pulse 60    Temp 97.7 F (36.5 C) (Oral)    Resp 11    Ht 5\' 4"  (1.626 m)    Wt 66 kg    SpO2 96%    BMI 24.98 kg/m  Physical Exam Vitals and nursing note reviewed.  Constitutional:      Appearance: Normal appearance. He is well-developed.  HENT:     Head: Normocephalic.     Comments: 2cm laceration occipital scalp    Right Ear: External ear normal.     Left Ear: External ear normal.     Nose: Nose normal.      Mouth/Throat:     Mouth: Mucous membranes are moist.  Eyes:     Extraocular Movements: Extraocular movements intact.     Pupils: Pupils are equal, round, and reactive to light.  Cardiovascular:     Rate and Rhythm: Normal rate and regular rhythm.  Pulmonary:     Effort: Pulmonary effort is normal.  Abdominal:     General: Abdomen is flat. There is no distension.  Musculoskeletal:        General: Normal range of motion.     Cervical back: Normal range of motion.  Skin:    General: Skin is warm.  Neurological:     Mental Status: He is alert. He is disoriented.     Comments: Pt alert to self, recognizes wife,   Pt retentively asked what happened.  Pt states repetively that he just woke up  Psychiatric:        Mood and Affect: Mood normal.    ED Results / Procedures / Treatments   Labs (all labs ordered are listed, but only abnormal results are displayed) Labs Reviewed  CBC WITH DIFFERENTIAL/PLATELET - Abnormal; Notable for the following components:      Result Value   Abs Immature Granulocytes 0.09 (*)    All other components within normal limits  COMPREHENSIVE METABOLIC PANEL - Abnormal; Notable for the following components:   Sodium 134 (*)    Glucose, Bld 114 (*)    Calcium 8.8 (*)    Total Protein 5.8 (*)    All other components within normal limits  RESP PANEL BY RT-PCR (FLU A&B, COVID) ARPGX2  URINALYSIS, ROUTINE W REFLEX MICROSCOPIC    EKG None  Radiology CT Head Wo Contrast  Result Date: 06/28/2021 CLINICAL DATA:  Head trauma, abnormal mental status (Age 61-64y); Neck trauma (Age >= 65y) EXAM: CT HEAD WITHOUT CONTRAST CT CERVICAL SPINE WITHOUT CONTRAST TECHNIQUE: Multidetector CT imaging of the head and cervical spine was performed following the standard protocol without intravenous contrast. Multiplanar CT image reconstructions of the cervical spine were also generated. RADIATION DOSE REDUCTION: This  exam was performed according to the departmental  dose-optimization program which includes automated exposure control, adjustment of the mA and/or kV according to patient size and/or use of iterative reconstruction technique. COMPARISON:  None. FINDINGS: CT HEAD FINDINGS Brain: No evidence of acute infarction, hemorrhage, hydrocephalus, extra-axial collection or mass lesion/mass effect. Mild patchy white matter hypoattenuation, nonspecific but compatible with chronic microvascular ischemic disease. Vascular: Calcific intracranial atherosclerosis. No hyperdense vessel identified. Skull: High left posterior scalp contusion without acute fracture. Sinuses/Orbits: Clear visualized sinuses. Unremarkable orbits. Other: No mastoid effusions. CT CERVICAL SPINE FINDINGS Alignment: Slight anterolisthesis of C4 on C5 and C5 on C6, favored degenerative in etiology given degenerative changes at these levels. Mild broad dextrocurvature. Skull base and vertebrae: No evidence of acute fracture. Benign limbus vertebra at multiple levels. Nonspecific sclerotic T1 vertebral body lesion. Soft tissues and spinal canal: No prevertebral fluid or swelling. No visible canal hematoma. Disc levels: Bulky anteriorly directed right-sided bridging osteophyte at C5-C6. Multilevel degenerative disease with disc height loss and endplate spurring. Multilevel facet and uncovertebral hypertrophy with varying degrees of neural foraminal stenosis. Upper chest: Visualized lung apices are clear. IMPRESSION: CT head: 1. No evidence of acute intracranial abnormality. 2. High left posterior scalp contusion without acute fracture. CT cervical spine: 1. No evidence of acute fracture or traumatic malalignment. 2. Nonspecific sclerotic T1 vertebral body lesion. This most likely represents a benign bone island, but if the patient has a known primary malignancy then consider MRI with contrast or bone scan to assess for osseous metastatic disease. Electronically Signed   By: Margaretha Sheffield M.D.   On: 06/28/2021  13:07   CT Cervical Spine Wo Contrast  Result Date: 06/28/2021 CLINICAL DATA:  Head trauma, abnormal mental status (Age 25-64y); Neck trauma (Age >= 65y) EXAM: CT HEAD WITHOUT CONTRAST CT CERVICAL SPINE WITHOUT CONTRAST TECHNIQUE: Multidetector CT imaging of the head and cervical spine was performed following the standard protocol without intravenous contrast. Multiplanar CT image reconstructions of the cervical spine were also generated. RADIATION DOSE REDUCTION: This exam was performed according to the departmental dose-optimization program which includes automated exposure control, adjustment of the mA and/or kV according to patient size and/or use of iterative reconstruction technique. COMPARISON:  None. FINDINGS: CT HEAD FINDINGS Brain: No evidence of acute infarction, hemorrhage, hydrocephalus, extra-axial collection or mass lesion/mass effect. Mild patchy white matter hypoattenuation, nonspecific but compatible with chronic microvascular ischemic disease. Vascular: Calcific intracranial atherosclerosis. No hyperdense vessel identified. Skull: High left posterior scalp contusion without acute fracture. Sinuses/Orbits: Clear visualized sinuses. Unremarkable orbits. Other: No mastoid effusions. CT CERVICAL SPINE FINDINGS Alignment: Slight anterolisthesis of C4 on C5 and C5 on C6, favored degenerative in etiology given degenerative changes at these levels. Mild broad dextrocurvature. Skull base and vertebrae: No evidence of acute fracture. Benign limbus vertebra at multiple levels. Nonspecific sclerotic T1 vertebral body lesion. Soft tissues and spinal canal: No prevertebral fluid or swelling. No visible canal hematoma. Disc levels: Bulky anteriorly directed right-sided bridging osteophyte at C5-C6. Multilevel degenerative disease with disc height loss and endplate spurring. Multilevel facet and uncovertebral hypertrophy with varying degrees of neural foraminal stenosis. Upper chest: Visualized lung apices  are clear. IMPRESSION: CT head: 1. No evidence of acute intracranial abnormality. 2. High left posterior scalp contusion without acute fracture. CT cervical spine: 1. No evidence of acute fracture or traumatic malalignment. 2. Nonspecific sclerotic T1 vertebral body lesion. This most likely represents a benign bone island, but if the patient has a known primary malignancy then consider MRI  with contrast or bone scan to assess for osseous metastatic disease. Electronically Signed   By: Margaretha Sheffield M.D.   On: 06/28/2021 13:07   DG Pelvis Portable  Result Date: 06/28/2021 CLINICAL DATA:  Fall, initial encounter. EXAM: PORTABLE PELVIS 1-2 VIEWS COMPARISON:  Coronal imaging CT abdomen pelvis 11/24/2019. FINDINGS: Left hip arthroplasty. No fracture or dislocation. Subchondral sclerosis and osteophytosis in the right hip. Degenerative changes in the visualized spine. IMPRESSION: 1. No acute findings. 2. Mild right hip osteoarthritis. Electronically Signed   By: Lorin Picket M.D.   On: 06/28/2021 13:53   DG Chest Portable 1 View  Result Date: 06/28/2021 CLINICAL DATA:  Fall EXAM: PORTABLE CHEST 1 VIEW COMPARISON:  Chest radiograph 09/17/2018 FINDINGS: The cardiomediastinal silhouette is within normal limits. There is no focal consolidation or pulmonary edema. There is no pleural effusion or pneumothorax. There is no acute osseous abnormality. IMPRESSION: No radiographic evidence of acute cardiopulmonary process. Electronically Signed   By: Valetta Mole M.D.   On: 06/28/2021 13:54    Procedures .Marland KitchenLaceration Repair  Date/Time: 06/28/2021 4:02 PM Performed by: Fransico Meadow, PA-C Authorized by: Fransico Meadow, PA-C   Consent:    Consent obtained:  Verbal   Consent given by:  Patient and spouse   Risks discussed:  Infection Universal protocol:    Procedure explained and questions answered to patient or proxy's satisfaction: yes     Patient identity confirmed:  Verbally with patient Anesthesia:     Anesthesia method:  Local infiltration   Local anesthetic:  Lidocaine 2% w/o epi Laceration details:    Location:  Scalp   Scalp location:  Occipital   Length (cm):  2   Depth (mm):  3 Exploration:    Limited defect created (wound extended): no   Treatment:    Area cleansed with:  Povidone-iodine   Amount of cleaning:  Standard   Irrigation solution:  Sterile saline Skin repair:    Repair method:  Staples   Number of staples:  3 Approximation:    Approximation:  Close Post-procedure details:    Procedure completion:  Tolerated well, no immediate complications    Medications Ordered in ED Medications  lidocaine (XYLOCAINE) 2 % (with pres) injection 200 mg (200 mg Infiltration Given by Other 06/28/21 1504)    ED Course/ Medical Decision Making/ A&P                           Medical Decision Making Pt fell down stairs and hit is head   Problems Addressed: Concussion with loss of consciousness, initial encounter: acute illness or injury Fall, initial encounter: acute illness or injury Laceration of scalp, initial encounter: acute illness or injury    Details: 2cm laceration occipital scalp  Amount and/or Complexity of Data Reviewed Independent Historian: spouse    Details: wife provides history.  Pt brought in by EMS. External Data Reviewed: notes.    Details: Last tetanus 2020 Labs: ordered. Decision-making details documented in ED Course.    Details: CBC in normal  Cmet no acute abnormalities Radiology: ordered and independent interpretation performed. Decision-making details documented in ED Course.    Details: Ct head and Ct cspine  no acute fractures ECG/medicine tests: ordered. Decision-making details documented in ED Course. Discussion of management or test interpretation with external provider(s): I discussed with Trauma  PA Margie Billet who discussed with Dr. Bobbye Morton,  They advised admit to medicine for persistant confusion/post concussive symptoms  Risk Decision regarding hospitalization.           Final Clinical Impression(s) / ED Diagnoses Final diagnoses:  Fall, initial encounter  Laceration of scalp, initial encounter  Concussion with loss of consciousness, initial encounter    Rx / DC Orders ED Discharge Orders     None         Sidney Ace 06/28/21 1613    Isla Pence, MD 06/29/21 [phone]

## 2021-06-28 NOTE — H&P (Addendum)
History and Physical    Patient: Ronnie Payne GHW:[id number] DOB: Apr 14, 1941 DOA: 06/28/2021 DOS: the patient was seen and examined on 06/28/2021 PCP: Shelda Pal, DO  Patient coming from: Home  Chief Complaint:  Chief Complaint  Patient presents with   Fall    HPI: Ronnie Payne is a 81 y.o. male with medical history significant of HTN, HLD, and arthritis who presents after having a fall at home.  His wife is present at bedside and helps provide additional history as he is currently unable to remember exactly what happened and repetitively ask what happened.  Normally patient is alert and oriented to person place and time.  His wife states that he had been going up the steps this morning when he may have slipped and fell backwards down the steps.  He has no prior history of heart issues to her knowledge and he otherwise has been in his normal state of health.  His wife heard him fall and when she came to check on him and noted that he was unconscious for anywhere from 3 to 4 minutes.  She reported that he had blood coming from the back of his head.  He was unaware of what happened and despite his wife telling him multiple times since this morning he still is having short-term memory loss.  She reports that this is abnormal for him.  The patient complains of headache and dizziness.  He is not on any blood thinners.  He had taken his medications of Tylenol this morning.  In the ED patient was noted to be afebrile with pulse 55-68, blood pressures 147/83-182/90, and all other vital signs maintained.  CT scan of the head and cervical spine noted no acute intercranial abnormality I left posterior scalp contusion without acute fracture and nonspecific sclerotic T1 vertebral body lesion and thought to be a bone island.  Scalp laceration was closed with 3 staples.  Labs noted sodium 134 and calcium 8.8.  X-rays of the pelvis and chest did not note any acute abnormality.  TRH called to  admit due to continued confusion.  Review of Systems: unable to review all systems due to the patient's inability to remember what occurred.  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 He has never used smokeless tobacco. He reports that he does not drink alcohol and does not use drugs.  Allergies  Allergen Reactions   Doxycycline     Unknown reaction   Tape Rash    Paper tape ok    Family History  Problem Relation Age of Onset   Thyroid cancer Mother    Emphysema Father    Lung cancer Sister    Colon cancer Neg Hx    Colon polyps Neg Hx    Esophageal cancer Neg Hx    Stomach cancer Neg Hx    Rectal cancer Neg Hx     Prior  to Admission medications   Medication Sig Start Date End Date Taking? Authorizing Provider  Ascorbic Acid (VITAMIN C PO) Take 1 tablet by mouth daily.   Yes [provider]  atenolol (TENORMIN) 50 MG tablet Take 1 tablet (50 mg total) by mouth daily. 06/09/21  Yes Shelda Pal, DO  atorvastatin (LIPITOR) 20 MG tablet Take 1 tablet (20 mg total) by mouth every evening. 06/09/21  Yes Shelda Pal, DO  carboxymethylcellulose (REFRESH PLUS) 0.5 % SOLN Place 1 drop into both eyes 3 (three) times daily as needed (dry eyes).   Yes [provider]  Cholecalciferol (VITAMIN D3 PO) Take 1 tablet by mouth daily.   Yes [provider]  MYRBETRIQ 50 MG TB24 tablet Take 50 mg by mouth daily at 12 noon.  06/19/21  Yes [provider]  naproxen sodium (ALEVE) 220 MG tablet Take 220 mg by mouth 2 (two) times daily as needed (back pain).   Yes [provider]  temazepam (RESTORIL) 15 MG capsule Take 1 capsule nightly as needed for sleep. Patient taking differently: Take 15 mg by mouth at bedtime as needed for sleep. 05/08/21  Yes Wendling, Crosby Oyster, DO  acyclovir (ZOVIRAX) 400 MG tablet TAKE 1 TABLET BY MOUTH THREE TIMES DAILY FOR 5 DAYS WHEN YOU HAVE A FLARE ON YOUR LIP Patient not taking: Reported on 06/28/2021 12/14/19   Shelda Pal, DO    Physical Exam: Vitals:   06/28/21 1430 06/28/21 1445 06/28/21 1500 06/28/21 1515  BP: (!) 156/73 (!) 154/71 (!) 182/90 (!) 147/83  Pulse: 61 62 68 65  Resp: 19 14 20  (!) 21  Temp:      TempSrc:      SpO2: 97% 97% 97% 98%  Weight:      Height:        Constitutional: Elderly male who appears to be in no acute distress at this time Eyes: PERRL, lids and conjunctivae normal ENMT: Mucous membranes are moist. Posterior pharynx clear of any exudate or lesions.  Neck: normal, supple, no masses, no thyromegaly Respiratory: clear to auscultation bilaterally, no wheezing, no crackles. Normal respiratory effort.  Cardiovascular: Regular rate and rhythm, no murmurs / rubs / gallops. No extremity edema. 2+ pedal pulses.    Abdomen: no tenderness.  Bowel sounds positive in all 4 quadrants. Musculoskeletal: no clubbing / cyanosis. No joint deformity upper and lower extremities.  Skin: Posterior left scalp laceration present without significant bleeding at this time after being closed with 3 staples Neurologic: CN 2-12 grossly intact. Strength 5/5 in all 4.  Psychiatric: Poor short-term memory at this time.  Alert and oriented to person and place, but not situation.  Patient repeatedly asking why he is here.  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  CT scan of the head noted no intracranial abnormality. -Admit to a medical telemetry bed -Neuro checks -Delirium precautions -Tylenol as needed for headache -Consider need of repeat CT scan for any neurological changes overnight -Held diazepam as needed for sleep as question the possibility of medication making current symptoms worse.  Replaced with melatonin if needed for sleep   Fall down stairs- (present on admission) Patient fell down the steps this morning.  Is not totally clear what led to the fall, but wife thinks that he possibly  slipped.  Imaging did not note any acute abnormality.  Scalp laceration. -Up with assistance -Bed alarm on -Check orthostatic vital signs in a.m. -Physical therapy to evaluate for safety -Follow-up telemetry overnight  Laceration of head- (present on admission) Secondary to fall.  Closure performed by ED provider with 3 staples.  -Follow-up in outpatient setting with PCP in 7 to 10 days to have staples removed  Essential hypertension- (present on admission) Blood pressures currently maintained 148/69.  Home blood pressure regimen includes atenolol 50 mg daily.  Patient had taken his medication for today. -Continue atenolol in a.m.  Hyperlipidemia- (present on admission) Home medication regimen includes atorvastatin 20 mg q. Evening. -Continue atorvastatin   Advance Care Planning:   Code Status: Full Code confirmed by wife at bedside  Consults: None  Family Communication: Wife and nephew updated at bedside  Severity of Illness: The appropriate patient status for this patient is OBSERVATION. Observation status is judged to be reasonable and necessary in order to provide the required intensity of service to ensure the patient's safety. The patient's presenting symptoms, physical exam findings, and  initial radiographic and laboratory data in the context of their medical condition is felt to place them at decreased risk for further clinical deterioration. Furthermore, it is anticipated that the patient will be medically stable for discharge from the hospital within 2 midnights of admission.   Author: Norval Morton, MD 06/28/2021 3:35 PM  For on call review [email or web address].

## 2021-06-29 ENCOUNTER — Encounter (HOSPITAL_COMMUNITY): Payer: Self-pay | Admitting: Internal Medicine

## 2021-06-29 DIAGNOSIS — S060X1A Concussion with loss of consciousness of 30 minutes or less, initial encounter: Secondary | ICD-10-CM | POA: Diagnosis not present

## 2021-06-29 LAB — BASIC METABOLIC PANEL
Anion gap: 8 (ref 5–15)
BUN: 20 mg/dL (ref 8–23)
CO2: 25 mmol/L (ref 22–32)
Calcium: 8.5 mg/dL — ABNORMAL LOW (ref 8.9–10.3)
Chloride: 101 mmol/L (ref 98–111)
Creatinine, Ser: 1.16 mg/dL (ref 0.61–1.24)
GFR, Estimated: >60 mL/min (ref >60)
Glucose, Bld: 104 mg/dL — ABNORMAL HIGH (ref 70–99)
Potassium: 4.4 mmol/L (ref 3.5–5.1)
Sodium: 134 mmol/L — ABNORMAL LOW (ref 135–145)

## 2021-06-29 LAB — CBC
HCT: 41.4 % (ref 39.0–52.0)
Hemoglobin: 14.5 g/dL (ref 13.0–17.0)
MCH: 31.9 pg (ref 26.0–34.0)
MCHC: 35 g/dL (ref 30.0–36.0)
MCV: 91 fL (ref 80.0–100.0)
Platelets: 167 10*3/uL (ref 150–400)
RBC: 4.55 MIL/uL (ref 4.22–5.81)
RDW: 12.7 % (ref 11.5–15.5)
WBC: 7.1 10*3/uL (ref 4.0–10.5)
nRBC: 0 % (ref 0.0–0.2)

## 2021-06-29 NOTE — TOC CAGE-AID Note (Signed)
Transition of Care [facility]) - CAGE-AID Screening   Patient Details  Name: Ronnie Payne MRN: [id number] Date of Birth: Nov 26, 1940  Transition of Care [facility]) CM/SW Contact:    Gaetano Hawthorne Tarpley-Carter, [place] Phone Number: 06/29/2021, 12:44 PM   Clinical Narrative: Pt participated in [place].  Pt stated she does not use substance or ETOH.  Pt was not offered resources, due to no usage of substance or ETOH.    Teige Rountree Tarpley-Carter, MSW, LCSW-A Pronouns:  She/Her/Hers [place] Transitions of Care Clinical Social Worker Direct Number:  [phone] [email or web address]  CAGE-AID Screening:    Have You Ever Felt You Ought to Cut Down on Your Drinking or Drug Use?: No Have People Annoyed You By [employer] Your Drinking Or Drug Use?: No Have You Felt Bad Or Guilty About Your Drinking Or Drug Use?: No Have You Ever Had a Drink or Used Drugs First Thing In The Morning to Steady Your Nerves or to Get Rid of a Hangover?: No CAGE-AID Score: 0  Substance Abuse Education Offered: No

## 2021-06-29 NOTE — Progress Notes (Signed)
Patient discharging home with wife. Vital signs stable at time of discharge as reflected in discharge summary. Discharge instructions given and verbal understanding returned. No questions at this time. 

## 2021-06-29 NOTE — TOC Transition Note (Signed)
Transition of Care [facility]) - CM/SW Discharge Note   Patient Details  Name: Ronnie Payne MRN: [id number] Date of Birth: [birth year]  [facility] CM/SW Contact:  Pollie Friar, RN Phone Number: 06/29/2021, 1:43 PM   Clinical Narrative:    Patient is discharging home with outpatient OT through [facility] Outpatient therapy. Information on the AVS. Pt doesn't use any DME at home. Wife already over sees his medications at home.  Wife and pt deny any issues with transportation.  Wife will provide transport home once discharged.    Final next level of care: OP Rehab Barriers to Discharge: No Barriers Identified   Patient Goals and CMS Choice     Choice offered to / list presented to : Patient, Spouse  Discharge Placement                       Discharge Plan and Services                                     Social Determinants of Health (SDOH) Interventions     Readmission Risk Interventions No flowsheet data found.

## 2021-06-29 NOTE — Evaluation (Signed)
Physical Therapy Evaluation Patient Details Name: Ronnie Payne MRN: [id number] DOB: 05/22/1940 Today's Date: 06/29/2021  History of Present Illness  Pt adm 2/22 after fall down the stairs. Pt with LOC and persistant confusion/post concussive symptoms. Pt with scalp laceration. CT negative for acute abnormality. PMH - HTN, arthritis. THA  Clinical Impression  Pt doing well with mobility and no further PT needed.  Ready for dc from PT standpoint. Reinforced post concussion handout that OT had given to pt.         Recommendations for follow up therapy are one component of a multi-disciplinary discharge planning process, led by the attending physician.  Recommendations may be updated based on patient status, additional functional criteria and insurance authorization.  Follow Up Recommendations No PT follow up    Assistance Recommended at Discharge None  Patient can return home with the following       Equipment Recommendations None recommended by PT  Recommendations for Other Services       Functional Status Assessment Patient has not had a recent decline in their functional status     Precautions / Restrictions Precautions Precautions: Fall Restrictions Weight Bearing Restrictions: No      Mobility  Bed Mobility Overal bed mobility: Independent                  Transfers Overall transfer level: Independent Equipment used: None                    Ambulation/Gait Ambulation/Gait assistance: Independent Gait Distance (Feet): 1000 Feet Assistive device: None Gait Pattern/deviations: WFL(Within Functional Limits) Gait velocity: normal Gait velocity interpretation: >4.37 ft/sec, indicative of normal walking speed   General Gait Details: Steady gait with good speed  Stairs Stairs: Yes Stairs assistance: Modified independent (Device/Increase time) Stair Management: Two rails, Alternating pattern, Forwards Number of Stairs: 3    Wheelchair  Mobility    Modified Rankin (Stroke Patients Only)       Balance Overall balance assessment: Independent                                           Pertinent Vitals/Pain Pain Assessment Pain Assessment: No/denies pain    Home Living Family/patient expects to be discharged to:: Private residence Living Arrangements: Spouse/significant other Available Help at Discharge: Family;Available 24 hours/day Type of Home: House Home Access: Level entry     Alternate Level Stairs-Number of Steps: flight Home Layout: Two level;Able to live on main level with bedroom/bathroom Home Equipment: Shower seat;Grab bars - toilet;Grab bars - tub/shower;Rolling Walker (2 wheels);Cane - single point      Prior Function Prior Level of Function : Independent/Modified Independent             Mobility Comments: no assistive device, goes to gym regularly ADLs Comments: drives, retired     Journalist, newspaper        Extremity/Trunk Assessment   Upper Extremity Assessment Upper Extremity Assessment: Defer to OT evaluation    Lower Extremity Assessment Lower Extremity Assessment: Overall WFL for tasks assessed       Communication   Communication: No difficulties  Cognition Arousal/Alertness: Awake/alert Behavior During Therapy: WFL for tasks assessed/performed Overall Cognitive Status: Impaired/Different from baseline Area of Impairment: Attention, Following commands, Memory, Awareness, Problem solving  Current Attention Level: Selective Memory: Decreased short-term memory Following Commands: Follows multi-step commands with increased time   Awareness: Emergent Problem Solving: Slow processing, Requires verbal cues          General Comments General comments (skin integrity, edema, etc.): spouse present, reviewed concusion handout.  Discussed recommendations for pt to have assist with all iadls and no driving at this time    Exercises      Assessment/Plan    PT Assessment Patient does not need any further PT services  PT Problem List         PT Treatment Interventions      PT Goals (Current goals can be found in the Care Plan section)  Acute Rehab PT Goals PT Goal Formulation: All assessment and education complete, DC therapy    Frequency       Co-evaluation               AM-PAC PT "6 Clicks" Mobility  Outcome Measure Help needed turning from your back to your side while in a flat bed without using bedrails?: None Help needed moving from lying on your back to sitting on the side of a flat bed without using bedrails?: None Help needed moving to and from a bed to a chair (including a wheelchair)?: None Help needed standing up from a chair using your arms (e.g., wheelchair or bedside chair)?: None Help needed to walk in hospital room?: None Help needed climbing 3-5 steps with a railing? : None 6 Click Score: 24    End of Session   Activity Tolerance: Patient tolerated treatment well Patient left: in bed;with call bell/phone within reach;with family/visitor present   PT Visit Diagnosis: History of falling (Z91.81)    Time: 5732-2025 PT Time Calculation (min) (ACUTE ONLY): 9 min   Charges:   PT Evaluation $PT Eval Low Complexity: 1 Low          [place] Pager [phone] Office [place] 06/29/2021, 1:31 PM

## 2021-06-29 NOTE — Care Management Obs Status (Signed)
[place]   Patient Details  Name: TIMOTHEY DAHLSTROM MRN: [id number] Date of Birth: 04/05/1941   Medicare Observation Status Notification Given:  Yes    Pollie Friar, RN 06/29/2021, 3:30 PM

## 2021-06-29 NOTE — Hospital Course (Signed)
81 year old male past medical history of hypertension and hyperlipidemia presented to the emergency room on 2/22 after a fall (although unsure if this was a mechanical fall or other cause) and in the emergency room, found to have a left posterior scalp contusion and concussion.  Patient had some confusion and so was monitored overnight.  Lab work unremarkable.

## 2021-06-29 NOTE — Plan of Care (Signed)

## 2021-06-29 NOTE — Evaluation (Signed)
Occupational Therapy Evaluation Patient Details Name: Ronnie Payne MRN: [id number] DOB: 1941/04/17 Today's Date: 06/29/2021   History of Present Illness Pt adm 2/22 after fall down the stairs. Pt with LOC and persistant confusion/post concussive symptoms. Pt with scalp laceration. CT negative for acute abnormality. PMH - HTN, arthritis. THA   Clinical Impression   PTA patient independent and driving. Admitted for above and presenting with decreased STM, impaired problem solving and attention.  He currently requires supervision for mobility and ADLs.  Requires up to min cueing for trail making task/path finding, recall of room number and safety. Reviewed fall prevention techniques and concussion handout.  Pt/spouse agreeable to spouse assist with IADLs at this time (meds, meals, cleaning); pt agreeable to not drive and plans to use [employer].  Will follow acutely.      Recommendations for follow up therapy are one component of a multi-disciplinary discharge planning process, led by the attending physician.  Recommendations may be updated based on patient status, additional functional criteria and insurance authorization.   Follow Up Recommendations  Outpatient OT    Assistance Recommended at Discharge Frequent or constant Supervision/Assistance  Patient can return home with the following A little help with walking and/or transfers;A little help with bathing/dressing/bathroom;Assistance with cooking/housework;Direct supervision/assist for financial management;Direct supervision/assist for medications management;Assist for transportation    Functional Status Assessment  Patient has had a recent decline in their functional status and demonstrates the ability to make significant improvements in function in a reasonable and predictable amount of time.  Equipment Recommendations  None recommended by OT    Recommendations for Other Services       Precautions / Restrictions  Precautions Precautions: Fall Restrictions Weight Bearing Restrictions: No      Mobility Bed Mobility               General bed mobility comments: OOB upon entry    Transfers                          Balance Overall balance assessment: No apparent balance deficits (not formally assessed)                                         ADL either performed or assessed with clinical judgement   ADL Overall ADL's : Needs assistance/impaired     Grooming: Supervision/safety;Standing           Upper Body Dressing : Set up;Sitting   Lower Body Dressing: Supervision/safety;Sit to/from stand   Toilet Transfer: Supervision/safety;Ambulation           Functional mobility during ADLs: Supervision/safety General ADL Comments: pt limited by cognition. reviewed safety and fall prevention techniques     Vision Baseline Vision/History: 1 Wears glasses Ability to See in Adequate Light: 0 Adequate Vision Assessment?: No apparent visual deficits     Perception     Praxis      Pertinent Vitals/Pain Pain Assessment Pain Assessment: No/denies pain     Hand Dominance     Extremity/Trunk Assessment Upper Extremity Assessment Upper Extremity Assessment: Generalized weakness   Lower Extremity Assessment Lower Extremity Assessment: Defer to PT evaluation       Communication Communication Communication: No difficulties   Cognition Arousal/Alertness: Awake/alert Behavior During Therapy: WFL for tasks assessed/performed Overall Cognitive Status: Impaired/Different from baseline Area of Impairment: Attention, Following commands, Memory, Awareness, Problem solving  Current Attention Level: Selective Memory: Decreased short-term memory Following Commands: Follows one step commands consistently, Follows one step commands with increased time, Follows multi-step commands with increased time   Awareness: Emergent Problem  Solving: Slow processing, Difficulty sequencing, Requires verbal cues General Comments: pt oriented and following mulitple step commands. able to complete 3 step trail making task with min cueing, decreased pathfinding and requires min cueing.  Min cueing to recall room #.     General Comments  spouse present, reviewed concusion handout.  Discussed recommendations for pt to have assist with all iadls and no driving at this time    Exercises     Shoulder Instructions      Home Living Family/patient expects to be discharged to:: Private residence Living Arrangements: Spouse/significant other Available Help at Discharge: Family;Available 24 hours/day Type of Home: House Home Access: Level entry     Home Layout: Two level;Able to live on main level with bedroom/bathroom Alternate Level Stairs-Number of Steps: flight Alternate Level Stairs-Rails: Right;Left Bathroom Shower/Tub: Occupational psychologist: Handicapped height     Home Equipment: Shower seat;Grab bars - toilet;Grab bars - tub/shower;Rolling Environmental consultant (2 wheels);Cane - single point          Prior Functioning/Environment Prior Level of Function : Independent/Modified Independent               ADLs Comments: drives, retired        OT Problem List: Decreased cognition;Decreased knowledge of precautions      OT Treatment/Interventions: Self-care/ADL training;DME and/or AE instruction;Cognitive remediation/compensation;Therapeutic activities    OT Goals(Current goals can be found in the care plan section) Acute Rehab OT Goals Patient Stated Goal: get back to normal OT Goal Formulation: With patient Time For Goal Achievement: 07/13/21 Potential to Achieve Goals: Good  OT Frequency: Min 2X/week    Co-evaluation              AM-PAC OT "6 Clicks" Daily Activity     Outcome Measure Help from another person eating meals?: None Help from another person taking care of personal grooming?: A  Little Help from another person toileting, which includes using toliet, bedpan, or urinal?: A Little Help from another person bathing (including washing, rinsing, drying)?: A Little Help from another person to put on and taking off regular upper body clothing?: A Little Help from another person to put on and taking off regular lower body clothing?: A Little 6 Click Score: 19   End of Session Equipment Utilized During Treatment: Gait belt Nurse Communication: Mobility status;Other (comment) (recs)  Activity Tolerance: Patient tolerated treatment well Patient left: in bed;with call bell/phone within reach;with family/visitor present  OT Visit Diagnosis: Other symptoms and signs involving cognitive function                Time: 6803-2122 OT Time Calculation (min): 23 min Charges:  OT General Charges $OT Visit: 1 Visit OT Evaluation $OT Eval Moderate Complexity: 1 Mod OT Treatments $Self Care/Home Management : 8-22 mins  Jolaine Artist, OT Acute Rehabilitation Services Pager [phone] Office [phone]   Delight Stare 06/29/2021, 11:53 AM

## 2021-06-30 ENCOUNTER — Telehealth: Payer: Self-pay

## 2021-06-30 DIAGNOSIS — F0781 Postconcussional syndrome: Secondary | ICD-10-CM | POA: Diagnosis not present

## 2021-06-30 DIAGNOSIS — I10 Essential (primary) hypertension: Secondary | ICD-10-CM | POA: Diagnosis not present

## 2021-06-30 DIAGNOSIS — E785 Hyperlipidemia, unspecified: Secondary | ICD-10-CM | POA: Diagnosis not present

## 2021-06-30 DIAGNOSIS — S0101XA Laceration without foreign body of scalp, initial encounter: Secondary | ICD-10-CM | POA: Diagnosis not present

## 2021-06-30 NOTE — Discharge Summary (Signed)
Physician Discharge Summary   Patient: Ronnie Payne MRN: [id number] DOB: 1940/06/08  Admit date:     06/28/2021  Discharge date: 06/29/2021  Discharge Physician: Annita Brod   PCP: Shelda Pal, DO   Recommendations at discharge:    Patient will follow-up with his PCP in 2 weeks for staple removal/wound check Patient referred for outpatient physical therapy.  Discharge Diagnoses: Principal Problem:   Post concussive syndrome Active Problems:   Hyperlipidemia   Essential hypertension   Fall down stairs   Laceration of head  Resolved Problems:   * No resolved hospital problems. *   Hospital Course: 81 year old male past medical history of hypertension and hyperlipidemia presented to the emergency room on 2/22 after a fall (although unsure if this was a mechanical fall or other cause) and in the emergency room, found to have a left posterior scalp contusion and concussion.  Patient had some confusion and so was monitored overnight.  Lab work unremarkable.  Assessment and Plan: * Post concussive syndrome- (present on admission) Monitored overnight.  No persistent headache.   Laceration of head- (present on admission) Secondary to fall.  Closure performed by ED provider with 3 staples.  -Follow-up in outpatient setting with PCP in 7 to 10 days to have staples removed  Fall down stairs- (present on admission) Cause secondary scalp laceration.  No evidence of any syncopal event although patient does not remember much in terms of events due to concussion.  Cleared now by concussion protocol.  Seen by PT recommended outpatient physical therapy.  Essential hypertension- (present on admission) Blood pressures currently maintained 148/69.  Continue atenolol  Hyperlipidemia- (present on admission) Continued on statin    Consultants: None Procedures performed: None Disposition: Home Diet recommendation:  Discharge Diet Orders (From admission, onward)      Start     Ordered   06/29/21 0000  Diet - low sodium heart healthy        06/29/21 1552           Cardiac diet  DISCHARGE MEDICATION: Allergies as of 06/29/2021       Reactions   Doxycycline    Unknown reaction   Tape Rash   Paper tape ok        Medication List     STOP taking these medications    acyclovir 400 MG tablet Commonly known as: ZOVIRAX       TAKE these medications    atenolol 50 MG tablet Commonly known as: TENORMIN Take 1 tablet (50 mg total) by mouth daily.   atorvastatin 20 MG tablet Commonly known as: LIPITOR Take 1 tablet (20 mg total) by mouth every evening.   carboxymethylcellulose 0.5 % Soln Commonly known as: REFRESH PLUS Place 1 drop into both eyes 3 (three) times daily as needed (dry eyes).   Myrbetriq 50 MG Tb24 tablet Generic drug: mirabegron ER Take 50 mg by mouth daily at 12 noon.   naproxen sodium 220 MG tablet Commonly known as: ALEVE Take 220 mg by mouth 2 (two) times daily as needed (back pain).   temazepam 15 MG capsule Commonly known as: RESTORIL Take 1 capsule nightly as needed for sleep. What changed:  how much to take how to take this when to take this reasons to take this additional instructions   VITAMIN C PO Take 1 tablet by mouth daily.   VITAMIN D3 PO Take 1 tablet by mouth daily.  Discharge Care Instructions  (From admission, onward)           Start     Ordered   06/29/21 0000  Discharge wound care:       Comments: Keep wound area clean.  See primary care doctor in 2 weeks to have staples removed.   06/29/21 [facility]. Schedule an appointment as soon as possible for a visit in 1 week(s).   Specialty: Rehabilitation Contact information: [place] [address] [place], [address] [place] [id number] [place] [address] Gaston, Boswell, DO Follow up in 2 week(s).    Specialty: Family Medicine Why: For staples removed Contact information: [address] [place] [address] [place] [address] [phone]                 Discharge Exam: Danley Danker Weights   06/28/21 1158  Weight: 66 kg   General: Alert and oriented x3, no acute distress HEENT: Scalp laceration clean, dry intact staples Cardiovascular: Regular rate and rhythm, S1-S2   Condition at discharge: good  The results of significant diagnostics from this hospitalization (including imaging, microbiology, ancillary and laboratory) are listed below for reference.   Imaging Studies: CT Head Wo Contrast  Result Date: 06/28/2021 CLINICAL DATA:  Head trauma, abnormal mental status (Age 52-64y); Neck trauma (Age >= 65y) EXAM: CT HEAD WITHOUT CONTRAST CT CERVICAL SPINE WITHOUT CONTRAST TECHNIQUE: Multidetector CT imaging of the head and cervical spine was performed following the standard protocol without intravenous contrast. Multiplanar CT image reconstructions of the cervical spine were also generated. RADIATION DOSE REDUCTION: This exam was performed according to the departmental dose-optimization program which includes automated exposure control, adjustment of the mA and/or kV according to patient size and/or use of iterative reconstruction technique. COMPARISON:  None. FINDINGS: CT HEAD FINDINGS Brain: No evidence of acute infarction, hemorrhage, hydrocephalus, extra-axial collection or mass lesion/mass effect. Mild patchy white matter hypoattenuation, nonspecific but compatible with chronic microvascular ischemic disease. Vascular: Calcific intracranial atherosclerosis. No hyperdense vessel identified. Skull: High left posterior scalp contusion without acute fracture. Sinuses/Orbits: Clear visualized sinuses. Unremarkable orbits. Other: No mastoid effusions. CT CERVICAL SPINE FINDINGS Alignment: Slight anterolisthesis of C4 on C5 and C5 on C6, favored degenerative in etiology given degenerative  changes at these levels. Mild broad dextrocurvature. Skull base and vertebrae: No evidence of acute fracture. Benign limbus vertebra at multiple levels. Nonspecific sclerotic T1 vertebral body lesion. Soft tissues and spinal canal: No prevertebral fluid or swelling. No visible canal hematoma. Disc levels: Bulky anteriorly directed right-sided bridging osteophyte at C5-C6. Multilevel degenerative disease with disc height loss and endplate spurring. Multilevel facet and uncovertebral hypertrophy with varying degrees of neural foraminal stenosis. Upper chest: Visualized lung apices are clear. IMPRESSION: CT head: 1. No evidence of acute intracranial abnormality. 2. High left posterior scalp contusion without acute fracture. CT cervical spine: 1. No evidence of acute fracture or traumatic malalignment. 2. Nonspecific sclerotic T1 vertebral body lesion. This most likely represents a benign bone island, but if the patient has a known primary malignancy then consider MRI with contrast or bone scan to assess for osseous metastatic disease. Electronically Signed   By: Margaretha Sheffield M.D.   On: 06/28/2021 13:07   CT Cervical Spine Wo Contrast  Result Date: 06/28/2021 CLINICAL DATA:  Head trauma, abnormal mental status (Age 45-64y); Neck trauma (Age >=  65y) EXAM: CT HEAD WITHOUT CONTRAST CT CERVICAL SPINE WITHOUT CONTRAST TECHNIQUE: Multidetector CT imaging of the head and cervical spine was performed following the standard protocol without intravenous contrast. Multiplanar CT image reconstructions of the cervical spine were also generated. RADIATION DOSE REDUCTION: This exam was performed according to the departmental dose-optimization program which includes automated exposure control, adjustment of the mA and/or kV according to patient size and/or use of iterative reconstruction technique. COMPARISON:  None. FINDINGS: CT HEAD FINDINGS Brain: No evidence of acute infarction, hemorrhage, hydrocephalus, extra-axial  collection or mass lesion/mass effect. Mild patchy white matter hypoattenuation, nonspecific but compatible with chronic microvascular ischemic disease. Vascular: Calcific intracranial atherosclerosis. No hyperdense vessel identified. Skull: High left posterior scalp contusion without acute fracture. Sinuses/Orbits: Clear visualized sinuses. Unremarkable orbits. Other: No mastoid effusions. CT CERVICAL SPINE FINDINGS Alignment: Slight anterolisthesis of C4 on C5 and C5 on C6, favored degenerative in etiology given degenerative changes at these levels. Mild broad dextrocurvature. Skull base and vertebrae: No evidence of acute fracture. Benign limbus vertebra at multiple levels. Nonspecific sclerotic T1 vertebral body lesion. Soft tissues and spinal canal: No prevertebral fluid or swelling. No visible canal hematoma. Disc levels: Bulky anteriorly directed right-sided bridging osteophyte at C5-C6. Multilevel degenerative disease with disc height loss and endplate spurring. Multilevel facet and uncovertebral hypertrophy with varying degrees of neural foraminal stenosis. Upper chest: Visualized lung apices are clear. IMPRESSION: CT head: 1. No evidence of acute intracranial abnormality. 2. High left posterior scalp contusion without acute fracture. CT cervical spine: 1. No evidence of acute fracture or traumatic malalignment. 2. Nonspecific sclerotic T1 vertebral body lesion. This most likely represents a benign bone island, but if the patient has a known primary malignancy then consider MRI with contrast or bone scan to assess for osseous metastatic disease. Electronically Signed   By: Margaretha Sheffield M.D.   On: 06/28/2021 13:07   DG Pelvis Portable  Result Date: 06/28/2021 CLINICAL DATA:  Fall, initial encounter. EXAM: PORTABLE PELVIS 1-2 VIEWS COMPARISON:  Coronal imaging CT abdomen pelvis 11/24/2019. FINDINGS: Left hip arthroplasty. No fracture or dislocation. Subchondral sclerosis and osteophytosis in the  right hip. Degenerative changes in the visualized spine. IMPRESSION: 1. No acute findings. 2. Mild right hip osteoarthritis. Electronically Signed   By: Lorin Picket M.D.   On: 06/28/2021 13:53   DG Chest Portable 1 View  Result Date: 06/28/2021 CLINICAL DATA:  Fall EXAM: PORTABLE CHEST 1 VIEW COMPARISON:  Chest radiograph 09/17/2018 FINDINGS: The cardiomediastinal silhouette is within normal limits. There is no focal consolidation or pulmonary edema. There is no pleural effusion or pneumothorax. There is no acute osseous abnormality. IMPRESSION: No radiographic evidence of acute cardiopulmonary process. Electronically Signed   By: Valetta Mole M.D.   On: 06/28/2021 13:54    Microbiology: Results for orders placed or performed during the hospital encounter of 06/28/21  Resp Panel by RT-PCR (Flu A&B, Covid) Nasopharyngeal Swab     Status: None   Collection Time: 06/28/21 11:51 AM   Specimen: Nasopharyngeal Swab; Nasopharyngeal(NP) swabs in vial transport medium  Result Value Ref Range Status   SARS Coronavirus 2 by RT PCR NEGATIVE NEGATIVE Final    Comment: (NOTE) SARS-CoV-2 target nucleic acids are NOT DETECTED.  The SARS-CoV-2 RNA is generally detectable in upper respiratory specimens during the acute phase of infection. The lowest concentration of SARS-CoV-2 viral copies this assay can detect is 138 copies/mL. A negative result does not preclude SARS-Cov-2 infection and should not be used as the sole basis  for treatment or other patient management decisions. A negative result may occur with  improper specimen collection/handling, submission of specimen other than nasopharyngeal swab, presence of viral mutation(s) within the areas targeted by this assay, and inadequate number of viral copies(<138 copies/mL). A negative result must be combined with clinical observations, patient history, and epidemiological information. The expected result is Negative.  Fact Sheet for Patients:   EntrepreneurPulse.com.au  Fact Sheet for Healthcare Providers:  IncredibleEmployment.be  This test is no t yet approved or cleared by the [place] FDA and  has been authorized for detection and/or diagnosis of SARS-CoV-2 by FDA under an Emergency Use Authorization (EUA). This EUA will remain  in effect (meaning this test can be used) for the duration of the COVID-19 declaration under Section 564(b)(1) of the Act, 21 U.S.C.section 360bbb-3(b)(1), unless the authorization is terminated  or revoked sooner.       Influenza A by PCR NEGATIVE NEGATIVE Final   Influenza B by PCR NEGATIVE NEGATIVE Final    Comment: (NOTE) The Xpert Xpress SARS-CoV-2/FLU/RSV plus assay is intended as an aid in the diagnosis of influenza from Nasopharyngeal swab specimens and should not be used as a sole basis for treatment. Nasal washings and aspirates are unacceptable for Xpert Xpress SARS-CoV-2/FLU/RSV testing.  Fact Sheet for Patients: EntrepreneurPulse.com.au  Fact Sheet for Healthcare Providers: IncredibleEmployment.be  This test is not yet approved or cleared by the [place] FDA and has been authorized for detection and/or diagnosis of SARS-CoV-2 by FDA under an Emergency Use Authorization (EUA). This EUA will remain in effect (meaning this test can be used) for the duration of the COVID-19 declaration under Section 564(b)(1) of the Act, 21 U.S.C. section 360bbb-3(b)(1), unless the authorization is terminated or revoked.  Performed at [facility], [place] [address] [place], [place] [address]     Labs: CBC: Recent Labs  Lab 06/28/21 1301 06/29/21 0210  WBC 8.2 7.1  NEUTROABS 5.8  --   HGB 14.7 14.5  HCT 42.8 41.4  MCV 91.6 91.0  PLT 156 798   Basic Metabolic Panel: Recent Labs  Lab 06/28/21 1301 06/29/21 0210  NA 134* 134*  K 3.9 4.4  CL 98 101  CO2 28 25  GLUCOSE 114* 104*  BUN 20 20   CREATININE 0.87 1.16  CALCIUM 8.8* 8.5*   Liver Function Tests: Recent Labs  Lab 06/28/21 1301  AST 22  ALT 28  ALKPHOS 67  BILITOT 0.9  PROT 5.8*  ALBUMIN 3.7   CBG: No results for input(s): GLUCAP in the last 168 hours.  Discharge time spent: Less than 30 minutes Annita Brod, MD [facility] 06/30/2021

## 2021-06-30 NOTE — Telephone Encounter (Signed)
Transition Care Management Follow-up Telephone Call Date of discharge and from where: 06/29/2021-[place] How have you been since you were released from the hospital? Doing just fine Any questions or concerns? No  Items Reviewed: Did the pt receive and understand the discharge instructions provided? Yes  Medications obtained and verified? Yes  Other? Yes  Any new allergies since your discharge? No  Dietary orders reviewed? Yes Do you have support at home? Yes   Home Care and Equipment/Supplies: Were home health services ordered? no If so, what is the name of the agency? N/a  Has the agency set up a time to come to the patient's home? not applicable Were any new equipment or medical supplies ordered?  No What is the name of the medical supply agency? N/a Were you able to get the supplies/equipment? not applicable Do you have any questions related to the use of the equipment or supplies? N/a  Functional Questionnaire: (I = Independent and D = Dependent) ADLs: I  Bathing/Dressing- I  Meal Prep- I  Eating- I  Maintaining continence- I  Transferring/Ambulation- I  Managing Meds- I  Follow up appointments reviewed:  PCP Hospital f/u appt confirmed? Yes  Scheduled to see Dr. Nani Ravens on 07/14/2021 @ 2:00. [facility] f/u appt confirmed? No  Patient to call & schedule Are transportation arrangements needed? No  If their condition worsens, is the pt aware to call PCP or go to the Emergency Dept.? Yes Was the patient provided with contact information for the PCP's office or ED? Yes Was to pt encouraged to call back with questions or concerns? Yes

## 2021-07-12 ENCOUNTER — Encounter: Payer: Self-pay | Admitting: Occupational Therapy

## 2021-07-12 ENCOUNTER — Other Ambulatory Visit: Payer: Self-pay

## 2021-07-12 ENCOUNTER — Ambulatory Visit: Payer: Medicare Other | Attending: Internal Medicine | Admitting: Occupational Therapy

## 2021-07-12 DIAGNOSIS — R2681 Unsteadiness on feet: Secondary | ICD-10-CM | POA: Diagnosis not present

## 2021-07-12 NOTE — Therapy (Signed)
[facility] [address], [place], [place], [address] Phone: [phone]   Fax:  [phone]  Occupational Therapy Evaluation  Patient Details  Name: Ronnie Payne MRN: [id number] Date of Birth: 1941/01/01 Referring Provider (OT): Annita Brod, MD   Encounter Date: 07/12/2021   OT End of Session - 07/12/21 1144     Visit Number 1    Number of Visits 1    Authorization Type Medicare Part A and B    OT Start Time 0930    OT Stop Time 1006    OT Time Calculation (min) 36 min    Activity Tolerance Patient tolerated treatment well    Behavior During Therapy [facility] for tasks assessed/performed             Past Medical History:  Diagnosis Date   Arthritis    Cold sore    Frequent headaches    History of skin cancer    Hyperlipidemia    Hypertension    Prediabetes     Past Surgical History:  Procedure Laterality Date   INGUINAL HERNIA REPAIR  10/06/2014   MENISCUS REPAIR Right    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   Subjective Assessment - 07/12/21 0935     Subjective  Pt reports going up the stairs and that he is all he remembers.  He reports he apparently fell backwards, falling about 10 steps, and hit his head when he landed, his wife called 911 and he was transported to the ER.  He reports that he has no idea how or why he fell but is pleased that nothing is broken. He reports that he still is in shock as he felt that he was in good condition and never thought it would happen to him.    Pertinent History hyperlipidemia, hypertension, arthritis    Currently in Pain? Yes    Pain Score 1     Pain Location Head    Pain Orientation Posterior    Pain Descriptors / Indicators  Sore    Pain Type Acute pain    Pain Onset 1 to 4 weeks ago    Pain Frequency Intermittent               [facility] OT Assessment - 07/12/21 0940       Assessment   Medical Diagnosis oncussion with loss of consciousness, initial encounter; fall; laceration of scalp    Referring Provider (OT) Annita Brod, MD    Onset Date/Surgical Date 06/28/21    Hand Dominance Right    Next MD Visit 07/14/2021    Prior Therapy only evaluations in hospital      Precautions   Precautions Fall      Restrictions   Weight Bearing Restrictions No      Balance Screen   Has the patient fallen in the past 6 months Yes    How many times? 1   fall that placed him in the hospital   Has the patient had a decrease in activity level because of a fear of falling?  No    Is the patient reluctant to leave their home because of a fear of falling?  No      Home  Environment   Family/patient expects to be discharged to: Private residence    Living Arrangements Spouse/significant other    Available Help at Discharge Available 24 hours/day    Type of [place] Access Level entry    [place] to live on main level with bedroom/bathroom    Alternate Level Stairs - Number of Steps --   had stair lift installed on Monday   [place] seat    Lives With Spouse      Prior Function   Level of [place] goes to the gym, plays tennis and raquetball      ADL   ADL comments Independent with all ADLs, transfers.  Pt reports wife assists with washing his hair due to incision.      IADL   Light Housekeeping Performs light daily tasks such as dishwashing, bed making    Meal Prep --   wife does all the cooking   Community Mobility Drives own vehicle    Medication Management Is responsible for taking medication in correct dosages at correct time    Physiological scientist financial matters  independently (budgets, writes checks, pays rent, bills goes to bank), collects and keeps track of income      Written Expression   Dominant Hand Right      Vision - History   Baseline Vision Wears glasses all the time      Vision Assessment   Eye Alignment Within Functional Limits    Ocular Range of Motion Within Functional Limits      Activity Tolerance   Activity Tolerance Tolerate 30+ min activity without fatigue    Activity Tolerance Comments reports occasional difficulty due to prior knee and hip replacement      Cognition   Overall Cognitive Status Within Functional Limits for tasks assessed    Cognition Comments Completed Trail Making A: no errors 88 seconds and B: no errors 90 seconds      ROM / Strength   AROM / PROM / Strength AROM;Strength      AROM   Overall AROM  Within functional limits for tasks performed      Strength   Overall Strength Within functional limits for tasks performed      Hand Function   Right Hand Gross Grasp Functional    Left Hand Gross Grasp Functional                              OT Education - 07/12/21 1143     Education Details Educated on purpose of OT. Educated on post concussion and return to leisure tasks as well as fall prevention.  Discussed DME to reduce fall risk in shower.    Person(s) Educated Patient    Methods Explanation    Comprehension Verbalized understanding                        Plan - 07/12/21 1146     Clinical Impression Statement Pt is a 81 y/o male who presents to OP OT due to initial decreased cognition and safety awareness post fall down stairs with concussion with loss of consciousness.  Pt currently lives with spouse in a 2 level home with ability to live primarily on main level of home and is retired, but fairly active going  to the gym and playing tennis prior to onset. PMHx includes hyperlipidemia, hypertension, and arthritis.  Engaged in discussion about DME and other  adaptations and strategies to reduce falls, activity tolerance and safety with return to leisure pursuits, and safety awareness.  Pt demonstrating improved processing speed, working memory, and problem solving during structured and per pt report with home routines.  Pt will not require any OT services at this time.    OT Occupational Profile and History Problem Focused Assessment - Including review of records relating to presenting problem    Occupational performance deficits (Please refer to evaluation for details): ADL's;Leisure;IADL's    Body Structure / Function / Physical Skills ADL;Balance    Cognitive Skills Problem Solve;Safety Awareness    Rehab Potential Excellent    Clinical Decision Making Limited treatment options, no task modification necessary    Comorbidities Affecting Occupational Performance: May have comorbidities impacting occupational performance    Modification or Assistance to Complete Evaluation  No modification of tasks or assist necessary to complete eval    OT Frequency One time visit    OT Treatment/Interventions Self-care/ADL training;Patient/family education;DME and/or AE instruction    Consulted and Agree with Plan of Care Patient             Patient will benefit from skilled therapeutic intervention in order to improve the following deficits and impairments:   Body Structure / Function / Physical Skills: ADL, Balance Cognitive Skills: Problem Solve, Safety Awareness     Visit Diagnosis: Unsteadiness on feet    Problem List Patient Active Problem List   Diagnosis Date Noted   Post concussive syndrome 06/28/2021   Fall down stairs 06/28/2021   Laceration of head 06/28/2021   Essential hypertension 06/09/2021   DNR (do not resuscitate) 10/09/2019   Prediabetes 08/31/2019   Hyperlipidemia 08/31/2019   CAP (community acquired pneumonia) 08/04/2018   Chest pain, musculoskeletal 08/04/2018   Cold sore    Primary osteoarthritis of left knee  09/20/2017   Osteoarthritis of left hip 09/17/2017    Simonne Come, OT 07/12/2021, 11:58 AM  [facility] [address], [place], [place], [address] Phone: [phone]   Fax:  [phone]  Name: Ronnie Payne MRN: [id number] Date of Birth: [birth year]

## 2021-07-14 ENCOUNTER — Encounter: Payer: Self-pay | Admitting: Family Medicine

## 2021-07-14 ENCOUNTER — Ambulatory Visit (INDEPENDENT_AMBULATORY_CARE_PROVIDER_SITE_OTHER): Payer: Medicare Other | Admitting: Family Medicine

## 2021-07-14 VITALS — BP 118/60 | HR 71 | Temp 98.2°F | Ht 63.0 in | Wt 142.4 lb

## 2021-07-14 DIAGNOSIS — S0101XA Laceration without foreign body of scalp, initial encounter: Secondary | ICD-10-CM

## 2021-07-14 DIAGNOSIS — S060X9A Concussion with loss of consciousness of unspecified duration, initial encounter: Secondary | ICD-10-CM

## 2021-07-14 NOTE — Patient Instructions (Addendum)
Fish oil 3 grams daily for 10 days then 2 grams daily  ?Vitamin D 4000 IU daily  ?CoQ10 '200mg'$  daily for headaches  ? ?To help reduce HEADACHES: ?Riboflavin/Vitamin B2 '400mg'$  ONCE DAILY ?Magnesium oxide 400 mg ONE-TWO TIMES DAILY ?May stop after headaches are resolved.   ?        ?To help with INSOMNIA: ?Melatonin 3-'5mg'$  AT BEDTIME ?Tart cherry extract, any dose at night ?   ?Other medicines to help decrease inflammation ?Alpha Lipoic Acid '100mg'$  TWICE DAILY ?Turmeric '500mg'$  twice daily ?Iron '65mg'$  elemental daily ?Vitamin D 4000 IU daily for 2 weeks then 2000 IU daily thereafter. ? ?If we aren't continuing to improve over the next few weeks, please send me a message.  ? ?Let us know if you need anything. ? ?

## 2021-07-14 NOTE — Progress Notes (Signed)
Chief Complaint  ?Patient presents with  ? Hospitalization Follow-up  ?  Remove staples from back of head ?  ? ? ?Subjective: ?Patient is a 81 y.o. male here for hosp f/u. ? ?Fell and hit head. Admitted for loss of consciousness. After calling 911, EMT's came and brough to ED. Does not remember workup. Once he got to the room, he started to become more lucid. He needs some staples removed from his scalp today. Reports no headaches, balance issues, pain, vision changes, difficulty swallowing, trouble swallowing. Having some insomnia since then as his only AE since d/c.  ? ?Objective: ?BP 118/60   Pulse 71   Temp 98.2 ?F (36.8 ?C) (Oral)   Ht '5\' 3"'$  (1.6 m)   Wt 142 lb 6 oz (64.6 kg)   SpO2 99%   BMI 25.22 kg/m?  ?General: Awake, appears stated age ?Heart: RRR, no LE edema ?Lungs: CTAB, no rales, wheezes or rhonchi. No accessory muscle use ?Neuro: Gait is slow but steady, no cerebellar signs ?Skin: Lateral occipital region on the left with a sagittal laceration that is excoriated but appears to be healing well without erythema, drainage, fluctuance, or TTP; there are 3 staples vertically aligned ?Psych: Age appropriate judgment and insight, normal affect and mood ? ?Procedure note, staple removal ?Verbal consent obtained ?Area was wiped off with alcohol x1 ?3 staples removed without issue ?The patient tolerated the procedure well without any immediate complications ? ?Assessment and Plan: ?Concussion with loss of consciousness, initial encounter ? ?Laceration of occipital region of scalp, initial encounter ? ?Over-the-counter supplements for insomnia and sleep provided.  Okay to continue temazepam as needed.  He will send me a message in the next 2 to 3 weeks if no improvement and we will get him set up with the concussion clinic.  3 staples removed today without issue.  The area appears to be healing well without signs of infection.  He will follow-up as originally scheduled or as needed. ?The patient voiced  understanding and agreement to the plan. ? ?Shelda Pal, DO ?07/14/21  ?2:34 PM ? ? ? ? ?

## 2021-07-18 DIAGNOSIS — L2089 Other atopic dermatitis: Secondary | ICD-10-CM | POA: Diagnosis not present

## 2021-07-18 DIAGNOSIS — L818 Other specified disorders of pigmentation: Secondary | ICD-10-CM | POA: Diagnosis not present

## 2021-07-19 DIAGNOSIS — M62838 Other muscle spasm: Secondary | ICD-10-CM | POA: Diagnosis not present

## 2021-07-22 ENCOUNTER — Encounter: Payer: Self-pay | Admitting: Family Medicine

## 2021-07-28 ENCOUNTER — Encounter: Payer: Self-pay | Admitting: Occupational Therapy

## 2021-08-01 DIAGNOSIS — M62838 Other muscle spasm: Secondary | ICD-10-CM | POA: Diagnosis not present

## 2021-08-07 ENCOUNTER — Ambulatory Visit: Payer: Medicare Other | Admitting: Family Medicine

## 2021-08-09 DIAGNOSIS — M62838 Other muscle spasm: Secondary | ICD-10-CM | POA: Diagnosis not present

## 2021-08-16 DIAGNOSIS — L218 Other seborrheic dermatitis: Secondary | ICD-10-CM | POA: Diagnosis not present

## 2021-08-16 DIAGNOSIS — L570 Actinic keratosis: Secondary | ICD-10-CM | POA: Diagnosis not present

## 2021-08-16 DIAGNOSIS — L818 Other specified disorders of pigmentation: Secondary | ICD-10-CM | POA: Diagnosis not present

## 2021-08-16 DIAGNOSIS — L111 Transient acantholytic dermatosis [Grover]: Secondary | ICD-10-CM | POA: Diagnosis not present

## 2021-08-28 DIAGNOSIS — R3915 Urgency of urination: Secondary | ICD-10-CM | POA: Diagnosis not present

## 2021-08-28 DIAGNOSIS — R350 Frequency of micturition: Secondary | ICD-10-CM | POA: Diagnosis not present

## 2021-08-28 DIAGNOSIS — N5201 Erectile dysfunction due to arterial insufficiency: Secondary | ICD-10-CM | POA: Diagnosis not present

## 2021-08-28 DIAGNOSIS — M62838 Other muscle spasm: Secondary | ICD-10-CM | POA: Diagnosis not present

## 2021-09-05 DIAGNOSIS — M6281 Muscle weakness (generalized): Secondary | ICD-10-CM | POA: Diagnosis not present

## 2021-09-05 DIAGNOSIS — S39012D Strain of muscle, fascia and tendon of lower back, subsequent encounter: Secondary | ICD-10-CM | POA: Diagnosis not present

## 2021-09-07 DIAGNOSIS — Z20822 Contact with and (suspected) exposure to covid-19: Secondary | ICD-10-CM | POA: Diagnosis not present

## 2021-09-08 DIAGNOSIS — Z20822 Contact with and (suspected) exposure to covid-19: Secondary | ICD-10-CM | POA: Diagnosis not present

## 2021-09-18 DIAGNOSIS — M62838 Other muscle spasm: Secondary | ICD-10-CM | POA: Diagnosis not present

## 2021-09-18 DIAGNOSIS — R1084 Generalized abdominal pain: Secondary | ICD-10-CM | POA: Diagnosis not present

## 2021-09-18 DIAGNOSIS — R3915 Urgency of urination: Secondary | ICD-10-CM | POA: Diagnosis not present

## 2021-09-18 DIAGNOSIS — R350 Frequency of micturition: Secondary | ICD-10-CM | POA: Diagnosis not present

## 2021-09-22 DIAGNOSIS — S39012D Strain of muscle, fascia and tendon of lower back, subsequent encounter: Secondary | ICD-10-CM | POA: Diagnosis not present

## 2021-09-22 DIAGNOSIS — M6281 Muscle weakness (generalized): Secondary | ICD-10-CM | POA: Diagnosis not present

## 2021-09-25 DIAGNOSIS — H31091 Other chorioretinal scars, right eye: Secondary | ICD-10-CM | POA: Diagnosis not present

## 2021-09-25 DIAGNOSIS — H0102A Squamous blepharitis right eye, upper and lower eyelids: Secondary | ICD-10-CM | POA: Diagnosis not present

## 2021-09-25 DIAGNOSIS — H25813 Combined forms of age-related cataract, bilateral: Secondary | ICD-10-CM | POA: Diagnosis not present

## 2021-09-25 DIAGNOSIS — H0102B Squamous blepharitis left eye, upper and lower eyelids: Secondary | ICD-10-CM | POA: Diagnosis not present

## 2021-09-28 DIAGNOSIS — M6281 Muscle weakness (generalized): Secondary | ICD-10-CM | POA: Diagnosis not present

## 2021-09-28 DIAGNOSIS — S39012D Strain of muscle, fascia and tendon of lower back, subsequent encounter: Secondary | ICD-10-CM | POA: Diagnosis not present

## 2021-10-03 DIAGNOSIS — M6281 Muscle weakness (generalized): Secondary | ICD-10-CM | POA: Diagnosis not present

## 2021-10-03 DIAGNOSIS — S39012D Strain of muscle, fascia and tendon of lower back, subsequent encounter: Secondary | ICD-10-CM | POA: Diagnosis not present

## 2021-10-05 DIAGNOSIS — N5201 Erectile dysfunction due to arterial insufficiency: Secondary | ICD-10-CM | POA: Diagnosis not present

## 2021-10-09 DIAGNOSIS — H6121 Impacted cerumen, right ear: Secondary | ICD-10-CM | POA: Diagnosis not present

## 2021-10-10 DIAGNOSIS — N401 Enlarged prostate with lower urinary tract symptoms: Secondary | ICD-10-CM | POA: Diagnosis not present

## 2021-10-10 DIAGNOSIS — N5201 Erectile dysfunction due to arterial insufficiency: Secondary | ICD-10-CM | POA: Diagnosis not present

## 2021-10-10 DIAGNOSIS — R3915 Urgency of urination: Secondary | ICD-10-CM | POA: Diagnosis not present

## 2021-10-16 ENCOUNTER — Encounter: Payer: Self-pay | Admitting: Family Medicine

## 2021-10-23 DIAGNOSIS — R350 Frequency of micturition: Secondary | ICD-10-CM | POA: Diagnosis not present

## 2021-10-23 DIAGNOSIS — R3915 Urgency of urination: Secondary | ICD-10-CM | POA: Diagnosis not present

## 2021-10-23 DIAGNOSIS — R351 Nocturia: Secondary | ICD-10-CM | POA: Diagnosis not present

## 2021-10-23 DIAGNOSIS — M62838 Other muscle spasm: Secondary | ICD-10-CM | POA: Diagnosis not present

## 2021-10-24 ENCOUNTER — Ambulatory Visit: Payer: Medicare Other

## 2021-10-25 ENCOUNTER — Ambulatory Visit: Payer: Medicare Other

## 2021-11-14 DIAGNOSIS — S39012D Strain of muscle, fascia and tendon of lower back, subsequent encounter: Secondary | ICD-10-CM | POA: Diagnosis not present

## 2021-11-14 DIAGNOSIS — M6281 Muscle weakness (generalized): Secondary | ICD-10-CM | POA: Diagnosis not present

## 2021-11-21 DIAGNOSIS — S39012D Strain of muscle, fascia and tendon of lower back, subsequent encounter: Secondary | ICD-10-CM | POA: Diagnosis not present

## 2021-11-21 DIAGNOSIS — M6281 Muscle weakness (generalized): Secondary | ICD-10-CM | POA: Diagnosis not present

## 2021-11-22 ENCOUNTER — Telehealth: Payer: Self-pay | Admitting: Family Medicine

## 2021-11-22 NOTE — Telephone Encounter (Signed)
Left message for patient to call back and schedule Medicare Annual Wellness Visit (AWV).   Please offer to do virtually or by telephone.  Left office number and my jabber [phone].  Last AWV:10/19/2020  Please schedule at anytime with Nurse Health Advisor.

## 2021-11-26 ENCOUNTER — Other Ambulatory Visit: Payer: Self-pay | Admitting: Family Medicine

## 2021-11-26 DIAGNOSIS — G4700 Insomnia, unspecified: Secondary | ICD-10-CM

## 2021-11-27 MED ORDER — TEMAZEPAM 15 MG PO CAPS: ORAL_CAPSULE | ORAL | 3 refills | Status: DC

## 2021-11-27 NOTE — Addendum Note (Signed)
Addended by: Laure Kidney on: 11/27/2021 08:45 AM   Modules accepted: Orders

## 2021-11-27 NOTE — Telephone Encounter (Signed)
Printed by error

## 2021-11-27 NOTE — Addendum Note (Signed)
Addended by: Ames Coupe on: 11/27/2021 09:02 AM   Modules accepted: Orders

## 2021-11-28 DIAGNOSIS — L2089 Other atopic dermatitis: Secondary | ICD-10-CM | POA: Diagnosis not present

## 2021-11-28 DIAGNOSIS — L570 Actinic keratosis: Secondary | ICD-10-CM | POA: Diagnosis not present

## 2021-11-28 DIAGNOSIS — L821 Other seborrheic keratosis: Secondary | ICD-10-CM | POA: Diagnosis not present

## 2021-11-28 DIAGNOSIS — L814 Other melanin hyperpigmentation: Secondary | ICD-10-CM | POA: Diagnosis not present

## 2021-11-28 DIAGNOSIS — D225 Melanocytic nevi of trunk: Secondary | ICD-10-CM | POA: Diagnosis not present

## 2021-11-28 DIAGNOSIS — L818 Other specified disorders of pigmentation: Secondary | ICD-10-CM | POA: Diagnosis not present

## 2021-11-28 DIAGNOSIS — Z85828 Personal history of other malignant neoplasm of skin: Secondary | ICD-10-CM | POA: Diagnosis not present

## 2021-11-28 DIAGNOSIS — Z85820 Personal history of malignant melanoma of skin: Secondary | ICD-10-CM | POA: Diagnosis not present

## 2021-11-28 DIAGNOSIS — Z08 Encounter for follow-up examination after completed treatment for malignant neoplasm: Secondary | ICD-10-CM | POA: Diagnosis not present

## 2021-11-28 DIAGNOSIS — D485 Neoplasm of uncertain behavior of skin: Secondary | ICD-10-CM | POA: Diagnosis not present

## 2021-11-28 DIAGNOSIS — B353 Tinea pedis: Secondary | ICD-10-CM | POA: Diagnosis not present

## 2021-11-28 DIAGNOSIS — Z872 Personal history of diseases of the skin and subcutaneous tissue: Secondary | ICD-10-CM | POA: Diagnosis not present

## 2021-11-29 DIAGNOSIS — S39012D Strain of muscle, fascia and tendon of lower back, subsequent encounter: Secondary | ICD-10-CM | POA: Diagnosis not present

## 2021-11-29 DIAGNOSIS — M6281 Muscle weakness (generalized): Secondary | ICD-10-CM | POA: Diagnosis not present

## 2021-12-05 DIAGNOSIS — M6281 Muscle weakness (generalized): Secondary | ICD-10-CM | POA: Diagnosis not present

## 2021-12-05 DIAGNOSIS — S39012D Strain of muscle, fascia and tendon of lower back, subsequent encounter: Secondary | ICD-10-CM | POA: Diagnosis not present

## 2021-12-08 ENCOUNTER — Ambulatory Visit: Payer: Medicare Other

## 2021-12-19 ENCOUNTER — Encounter: Payer: Self-pay | Admitting: Family Medicine

## 2021-12-19 ENCOUNTER — Ambulatory Visit (INDEPENDENT_AMBULATORY_CARE_PROVIDER_SITE_OTHER): Payer: Medicare Other | Admitting: Family Medicine

## 2021-12-19 VITALS — BP 110/64 | HR 65 | Temp 98.1°F | Ht 64.0 in | Wt 146.2 lb

## 2021-12-19 DIAGNOSIS — I10 Essential (primary) hypertension: Secondary | ICD-10-CM

## 2021-12-19 DIAGNOSIS — E785 Hyperlipidemia, unspecified: Secondary | ICD-10-CM | POA: Diagnosis not present

## 2021-12-19 LAB — COMPREHENSIVE METABOLIC PANEL
ALT: 20 U/L (ref 0–53)
AST: 23 U/L (ref 0–37)
Albumin: 4.4 g/dL (ref 3.5–5.2)
Alkaline Phosphatase: 74 U/L (ref 39–117)
BUN: 21 mg/dL (ref 6–23)
CO2: 30 mEq/L (ref 19–32)
Calcium: 9.3 mg/dL (ref 8.4–10.5)
Chloride: 101 mEq/L (ref 96–112)
Creatinine, Ser: 0.94 mg/dL (ref 0.40–1.50)
GFR: 76.1 mL/min (ref >60.00)
Glucose, Bld: 96 mg/dL (ref 70–99)
Potassium: 4.1 mEq/L (ref 3.5–5.1)
Sodium: 138 mEq/L (ref 135–145)
Total Bilirubin: 0.8 mg/dL (ref 0.2–1.2)
Total Protein: 6.4 g/dL (ref 6.0–8.3)

## 2021-12-19 LAB — LIPID PANEL
Cholesterol: 166 mg/dL (ref 0–200)
HDL: 50.3 mg/dL (ref >39.00)
NonHDL: 115.83
Total CHOL/HDL Ratio: 3
Triglycerides: 214 mg/dL — ABNORMAL HIGH (ref 0.0–149.0)
VLDL: 42.8 mg/dL — ABNORMAL HIGH (ref 0.0–40.0)

## 2021-12-19 LAB — LDL CHOLESTEROL, DIRECT: Direct LDL: 98 mg/dL

## 2021-12-19 NOTE — Progress Notes (Signed)
Chief Complaint  Patient presents with   Follow-up    Subjective Ronnie Payne is a 81 y.o. male who presents for hypertension follow up. He does monitor home blood pressures. Blood pressures ranging from 130's/70's on average. He is compliant with medications. Patient has these side effects of medication: none He is adhering to a healthy diet overall. Current exercise: racquetball, walking, cycling, lifting weights No CP or SOB.   Hyperlipidemia Patient presents for dyslipidemia follow up. Currently being treated with Lipitor 20 mg/d and compliance with treatment thus far has been good. He denies myalgias. Diet/exercise as above. The patient is not known to have coexisting coronary artery disease.   Past Medical History:  Diagnosis Date   Arthritis    Cold sore    Frequent headaches    History of skin cancer    Hyperlipidemia    Hypertension    Prediabetes     Exam BP 110/64   Pulse 65   Temp 98.1 F (36.7 C) (Oral)   Ht '5\' 4"'$  (1.626 m)   Wt 146 lb 4 oz (66.3 kg)   SpO2 96%   BMI 25.10 kg/m  General:  well developed, well nourished, in no apparent distress Heart: RRR, no bruits, no LE edema Lungs: clear to auscultation, no accessory muscle use Psych: well oriented with normal range of affect and appropriate judgment/insight  Essential hypertension  Hyperlipidemia, unspecified hyperlipidemia type - Plan: Comprehensive metabolic panel, Lipid panel  Chronic, stable.  Continue atenolol 100 mg daily.  Counseled on diet and exercise. Chronic, stable.  Check labs.  Continue Lipitor 20 mg daily. He politely declined the pneumonia vaccine today. F/u in 6 months or as needed. The patient voiced understanding and agreement to the plan.  [place], DO 12/19/21  9:31 AM

## 2021-12-19 NOTE — Patient Instructions (Addendum)
Give us 2-3 business days to get the results of your labs back.   Keep the diet clean and stay active.  I recommend getting the flu shot in mid October. This suggestion would change if the CDC comes out with a different recommendation.   Let us know if you need anything. 

## 2021-12-20 ENCOUNTER — Other Ambulatory Visit: Payer: Self-pay | Admitting: Family Medicine

## 2021-12-20 ENCOUNTER — Ambulatory Visit (INDEPENDENT_AMBULATORY_CARE_PROVIDER_SITE_OTHER): Payer: Medicare Other

## 2021-12-20 VITALS — BP 110/64 | HR 65 | Temp 98.0°F | Resp 16 | Ht 64.0 in | Wt 150.0 lb

## 2021-12-20 DIAGNOSIS — Z0000 Encounter for general adult medical examination without abnormal findings: Secondary | ICD-10-CM

## 2021-12-20 DIAGNOSIS — E785 Hyperlipidemia, unspecified: Secondary | ICD-10-CM

## 2021-12-20 NOTE — Progress Notes (Signed)
Subjective:   Ronnie Payne is a 81 y.o. male who presents for an Initial Medicare Annual Wellness Visit.  Review of Systems           Objective:    There were no vitals filed for this visit. There is no height or weight on file to calculate BMI.     07/12/2021   10:11 AM 06/29/2021    9:00 AM 06/28/2021   11:59 AM 10/19/2020    3:42 PM 10/16/2019    3:21 PM 09/10/2017   10:57 AM 07/11/2017    8:55 AM  Advanced Directives  Does Patient Have a Medical Advance Directive? No No Yes Yes Yes Yes Yes  Type of Scientist, research (medical);Living will [place];Living will [place];Living will [place];Living will  Does patient want to make changes to medical advance directive? No - Patient declined No - Patient declined No - Patient declined  No - Patient declined  No - Patient declined  Copy of [place] in Chart?    No - copy requested No - copy requested No - copy requested No - copy requested  Would patient like information on creating a medical advance directive? No - Patient declined No - Patient declined         Current Medications (verified) Outpatient Encounter Medications as of 12/20/2021  Medication Sig   Ascorbic Acid (VITAMIN C PO) Take 1 tablet by mouth daily.   atenolol (TENORMIN) 50 MG tablet Take 1 tablet (50 mg total) by mouth daily.   atorvastatin (LIPITOR) 20 MG tablet Take 1 tablet (20 mg total) by mouth every evening.   carboxymethylcellulose (REFRESH PLUS) 0.5 % SOLN Place 1 drop into both eyes 3 (three) times daily as needed (dry eyes).   Cholecalciferol (VITAMIN D3 PO) Take 1 tablet by mouth daily.   MYRBETRIQ 50 MG TB24 tablet Take 50 mg by mouth daily at 12 noon.   temazepam (RESTORIL) 15 MG capsule TAKE 1 CAPSULE BY MOUTH EVERY NIGHT AT BEDTIME FOR SLEEP.   No facility-administered encounter medications on file as of 12/20/2021.    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   Procedure: TOTAL HIP ARTHROPLASTY ANTERIOR APPROACH;  Surgeon: Frederik Pear, MD;  Location: [facility];  Service: Orthopedics;  Laterality: Left;   TRANSURETHRAL RESECTION OF PROSTATE  2000-2012   Family History  Problem Relation Age of Onset   Thyroid cancer Mother    Emphysema Father    Lung cancer Sister    Colon cancer Neg Hx    Colon polyps Neg Hx    Esophageal cancer Neg Hx    Stomach cancer Neg Hx    Rectal cancer Neg Hx    Social History   Socioeconomic History   Marital status: Married    Spouse name: Not on file   Number of children: 1   Years of education: Not on file   Highest education level: Not on file  Occupational History   Occupation: retired    Comment: Optometrist  Tobacco Use   Smoking status: Never   Smokeless tobacco: Never  Vaping Use   Vaping Use: Never used  Substance and Sexual Activity   Alcohol use: No   Drug use: No   Sexual activity: Yes  Other Topics Concern   Not on file  Social History Narrative   Not on file   Social Determinants of Health   Financial Resource Strain: Low Risk  (10/19/2020)   Overall Financial Resource Strain (CARDIA)    Difficulty of Paying Living Expenses: Not hard at all  Food Insecurity: No Food Insecurity (10/19/2020)   Hunger Vital Sign    Worried About Running Out of Food in the Last Year: Never true    Ran Out of Food in the Last Year: Never true  Transportation Needs: No Transportation Needs (10/19/2020)   PRAPARE - Hydrologist (Medical): No    Lack of Transportation (Non-Medical): No  Physical Activity:  Sufficiently Active (10/19/2020)   Exercise Vital Sign    Days of Exercise per Week: 3 days    Minutes of Exercise per Session: 60 min  Stress: No Stress Concern Present (10/19/2020)   [place]    Feeling of Stress : Not at all  Social Connections: Moderately Integrated (10/19/2020)   Social Connection and Isolation Panel [NHANES]    Frequency of Communication with Friends and Family: More than three times a week    Frequency of Social Gatherings with Friends and Family: More than three times a week    Attends Religious Services: More than 4 times per year    Active Member of [employer] or Organizations: No    Attends Archivist Meetings: Never    Marital Status: Married    Tobacco Counseling Counseling given: Not Answered   Clinical Intake:                 Diabetic?no         Activities of Daily Living    06/29/2021    9:00 AM  In your present state of health, do you have any difficulty performing the following activities:  Hearing? 0  Vision? 0  Difficulty concentrating or making decisions? 1  Walking or climbing stairs? 0  Dressing or bathing? 0  Doing errands, shopping? 0    Patient Care Team: Shelda Pal, DO as PCP - General (Family Medicine)  Indicate any recent Medical Services you may have received from other than [facility] providers in the past year (date may be approximate).     Assessment:   This is a routine wellness examination for [facility].  Hearing/Vision screen No results found.  Dietary issues and exercise activities discussed:     Goals Addressed   None    Depression Screen    12/21/2020   10:06 AM 10/19/2020    3:47 PM 10/16/2019    3:26 PM 07/11/2017    8:56 AM 04/25/2016    8:33 AM  PHQ 2/9 Scores  PHQ - 2 Score 0 0 0 0 0    Fall Risk    12/21/2020   10:06 AM 10/19/2020    3:46 PM 10/16/2019    3:26 PM 12/05/2018    3:08 PM 07/11/2017    8:56 AM   Fall Risk   Falls in the past year? 0 0 0 0 No  Comment    Emmi Telephone Survey: data to providers prior to load   Number falls in  past yr: 0 0 0    Injury with Fall? 0 0 0    Follow up Falls evaluation completed Falls prevention discussed Education provided;Falls prevention discussed      FALL RISK PREVENTION PERTAINING TO THE HOME:  Any stairs in or around the home? Yes  If so, are there any without handrails? No  Home free of loose throw rugs in walkways, pet beds, electrical cords, etc? Yes  Adequate lighting in your home to reduce risk of falls? Yes   ASSISTIVE DEVICES UTILIZED TO PREVENT FALLS:  Life alert? No  Use of a cane, walker or w/c? No  Grab bars in the bathroom? Yes  Shower chair or bench in shower? Yes  Elevated toilet seat or a handicapped toilet? Yes   TIMED UP AND GO:  Was the test performed? Yes .  Length of time to ambulate 10 feet: 10 sec.   Gait steady and fast without use of assistive device  Cognitive Function:    07/11/2017    8:56 AM  MMSE - Mini Mental State Exam  Orientation to time 5  Orientation to Place 5  Registration 3  Attention/ Calculation 5  Recall 2  Language- name 2 objects 2  Language- repeat 1  Language- follow 3 step command 3  Language- read & follow direction 1  Write a sentence 1  Copy design 1  Total score 29        Immunizations Immunization History  Administered Date(s) Administered   Influenza, High Dose Seasonal PF 01/06/2016, 02/19/2017, 02/03/2018   PFIZER(Purple Top)SARS-COV-2 Vaccination 07/15/2019, 08/11/2019   Pneumococcal Polysaccharide-23 02/19/2017   Pneumococcal-Unspecified 02/05/2015   Tdap 09/22/2018    TDAP status: Up to date  Flu Vaccine status: Due, Education has been provided regarding the importance of this vaccine. Advised may receive this vaccine at local pharmacy or Health Dept. Aware to provide a copy of the vaccination record if obtained from local pharmacy or Health Dept.  Verbalized acceptance and understanding.  Pneumococcal vaccine status: Up to date  Covid-19 vaccine status: Information provided on how to obtain vaccines.   Qualifies for Shingles Vaccine? Yes   Zostavax completed No   Shingrix Completed?: No.    Education has been provided regarding the importance of this vaccine. Patient has been advised to call insurance company to determine out of pocket expense if they have not yet received this vaccine. Advised may also receive vaccine at local pharmacy or Health Dept. Verbalized acceptance and understanding.  Screening Tests Health Maintenance  Topic Date Due   Pneumonia Vaccine 42+ Years old (2 - PCV) 02/19/2018   INFLUENZA VACCINE  12/05/2021   TETANUS/TDAP  09/21/2028   HPV VACCINES  Aged Out   COVID-19 Vaccine  Discontinued   Zoster Vaccines- Shingrix  Discontinued    Health Maintenance  Health Maintenance Due  Topic Date Due   Pneumonia Vaccine 78+ Years old (2 - PCV) 02/19/2018   INFLUENZA VACCINE  12/05/2021    Colorectal cancer screening: No longer required.   Lung Cancer Screening: (Low Dose CT Chest recommended if Age 97-80 years, 30 pack-year currently smoking OR have quit w/in 15years.) does not qualify.   Lung Cancer Screening Referral: n/a  Additional Screening:  Hepatitis C Screening: does not qualify; Completed aged out  Vision Screening: Recommended annual ophthalmology exams for early detection of glaucoma and other disorders of the eye. Is the patient up to date with their annual eye exam?  No  Who is the provider or what  is the name of the office in which the patient attends annual eye exams? Dr. Nicki Reaper If pt is not established with a provider, would they like to be referred to a provider to establish care? No .   Dental Screening: Recommended annual dental exams for proper oral hygiene  Community Resource Referral / Chronic Care Management: CRR required this visit?  No   CCM required this visit?  No       Plan:     I have personally reviewed and noted the following in the patient's chart:   Medical and social history Use of alcohol, tobacco or illicit drugs  Current medications and supplements including opioid prescriptions. Patient is currently taking opioid prescriptions. Information provided to patient regarding non-opioid alternatives. Patient advised to discuss non-opioid treatment plan with their provider. Functional ability and status Nutritional status Physical activity Advanced directives List of other physicians Hospitalizations, surgeries, and ER visits in previous 12 months Vitals Screenings to include cognitive, depression, and falls Referrals and appointments  In addition, I have reviewed and discussed with patient certain preventive protocols, quality metrics, and best practice recommendations. A written personalized care plan for preventive services as well as general preventive health recommendations were provided to patient.     Duard Brady Jeven Topper, [place]   12/20/2021   Nurse Notes: none

## 2021-12-20 NOTE — Patient Instructions (Signed)
Ronnie Payne , Thank you for taking time to come for your Medicare Wellness Visit. I appreciate your ongoing commitment to your health goals. Please review the following plan we discussed and let me know if I can assist you in the future.   Screening recommendations/referrals: Colonoscopy: no longer needed Recommended yearly ophthalmology/optometry visit for glaucoma screening and checkup Recommended yearly dental visit for hygiene and checkup  Vaccinations: Influenza vaccine: Due-May obtain vaccine at our office or your local pharmacy.  Pneumococcal vaccine: up to date Tdap vaccine: up to date Shingles vaccine: Due-May obtain vaccine at your local pharmacy.    Covid-19: Due-May obtain vaccine at your local pharmacy.   Advanced directives: yes, on file  Conditions/risks identified: see problem list   Next appointment: Follow up in one year for your annual wellness visit.   Preventive Care 13 Years and Older, Male Preventive care refers to lifestyle choices and visits with your health care provider that can promote health and wellness. What does preventive care include? A yearly physical exam. This is also called an annual well check. Dental exams once or twice a year. Routine eye exams. Ask your health care provider how often you should have your eyes checked. Personal lifestyle choices, including: Daily care of your teeth and gums. Regular physical activity. Eating a healthy diet. Avoiding tobacco and drug use. Limiting alcohol use. Practicing safe sex. Taking low doses of aspirin every day. Taking vitamin and mineral supplements as recommended by your health care provider. What happens during an annual well check? The services and screenings done by your health care provider during your annual well check will depend on your age, overall health, lifestyle risk factors, and family history of disease. Counseling  Your health care provider may ask you questions about  your: Alcohol use. Tobacco use. Drug use. Emotional well-being. Home and relationship well-being. Sexual activity. Eating habits. History of falls. Memory and ability to understand (cognition). Work and work Statistician. Screening  You may have the following tests or measurements: Height, weight, and BMI. Blood pressure. Lipid and cholesterol levels. These may be checked every 5 years, or more frequently if you are over 77 years old. Skin check. Lung cancer screening. You may have this screening every year starting at age 87 if you have a 30-pack-year history of smoking and currently smoke or have quit within the past 15 years. Fecal occult blood test (FOBT) of the stool. You may have this test every year starting at age 52. Flexible sigmoidoscopy or colonoscopy. You may have a sigmoidoscopy every 5 years or a colonoscopy every 10 years starting at age 42. Prostate cancer screening. Recommendations will vary depending on your family history and other risks. Hepatitis C blood test. Hepatitis B blood test. Sexually transmitted disease (STD) testing. Diabetes screening. This is done by checking your blood sugar (glucose) after you have not eaten for a while (fasting). You may have this done every 1-3 years. Abdominal aortic aneurysm (AAA) screening. You may need this if you are a current or former smoker. Osteoporosis. You may be screened starting at age 38 if you are at high risk. Talk with your health care provider about your test results, treatment options, and if necessary, the need for more tests. Vaccines  Your health care provider may recommend certain vaccines, such as: Influenza vaccine. This is recommended every year. Tetanus, diphtheria, and acellular pertussis (Tdap, Td) vaccine. You may need a Td booster every 10 years. Zoster vaccine. You may need this after age 74.  Pneumococcal 13-valent conjugate (PCV13) vaccine. One dose is recommended after age 59. Pneumococcal  polysaccharide (PPSV23) vaccine. One dose is recommended after age 25. Talk to your health care provider about which screenings and vaccines you need and how often you need them. This information is not intended to replace advice given to you by your health care provider. Make sure you discuss any questions you have with your health care provider. Document Released: 05/20/2015 Document Revised: 01/11/2016 Document Reviewed: 02/22/2015 Elsevier Interactive Patient Education  2017 [employer] Prevention in the Home Falls can cause injuries. They can happen to people of all ages. There are many things you can do to make your home safe and to help prevent falls. What can I do on the outside of my home? Regularly fix the edges of walkways and driveways and fix any cracks. Remove anything that might make you trip as you walk through a door, such as a raised step or threshold. Trim any bushes or trees on the path to your home. Use bright outdoor lighting. Clear any walking paths of anything that might make someone trip, such as rocks or tools. Regularly check to see if handrails are loose or broken. Make sure that both sides of any steps have handrails. Any raised decks and porches should have guardrails on the edges. Have any leaves, snow, or ice cleared regularly. Use sand or salt on walking paths during winter. Clean up any spills in your garage right away. This includes oil or grease spills. What can I do in the bathroom? Use night lights. Install grab bars by the toilet and in the tub and shower. Do not use towel bars as grab bars. Use non-skid mats or decals in the tub or shower. If you need to sit down in the shower, use a plastic, non-slip stool. Keep the floor dry. Clean up any water that spills on the floor as soon as it happens. Remove soap buildup in the tub or shower regularly. Attach bath mats securely with double-sided non-slip rug tape. Do not have throw rugs and other  things on the floor that can make you trip. What can I do in the bedroom? Use night lights. Make sure that you have a light by your bed that is easy to reach. Do not use any sheets or blankets that are too big for your bed. They should not hang down onto the floor. Have a firm chair that has side arms. You can use this for support while you get dressed. Do not have throw rugs and other things on the floor that can make you trip. What can I do in the kitchen? Clean up any spills right away. Avoid walking on wet floors. Keep items that you use a lot in easy-to-reach places. If you need to reach something above you, use a strong step stool that has a grab bar. Keep electrical cords out of the way. Do not use floor polish or wax that makes floors slippery. If you must use wax, use non-skid floor wax. Do not have throw rugs and other things on the floor that can make you trip. What can I do with my stairs? Do not leave any items on the stairs. Make sure that there are handrails on both sides of the stairs and use them. Fix handrails that are broken or loose. Make sure that handrails are as long as the stairways. Check any carpeting to make sure that it is firmly attached to the stairs. Fix any  carpet that is loose or worn. Avoid having throw rugs at the top or bottom of the stairs. If you do have throw rugs, attach them to the floor with carpet tape. Make sure that you have a light switch at the top of the stairs and the bottom of the stairs. If you do not have them, ask someone to add them for you. What else can I do to help prevent falls? Wear shoes that: Do not have high heels. Have rubber bottoms. Are comfortable and fit you well. Are closed at the toe. Do not wear sandals. If you use a stepladder: Make sure that it is fully opened. Do not climb a closed stepladder. Make sure that both sides of the stepladder are locked into place. Ask someone to hold it for you, if possible. Clearly  mark and make sure that you can see: Any grab bars or handrails. First and last steps. Where the edge of each step is. Use tools that help you move around (mobility aids) if they are needed. These include: Canes. Walkers. Scooters. Crutches. Turn on the lights when you go into a dark area. Replace any light bulbs as soon as they burn out. Set up your furniture so you have a clear path. Avoid moving your furniture around. If any of your floors are uneven, fix them. If there are any pets around you, be aware of where they are. Review your medicines with your doctor. Some medicines can make you feel dizzy. This can increase your chance of falling. Ask your doctor what other things that you can do to help prevent falls. This information is not intended to replace advice given to you by your health care provider. Make sure you discuss any questions you have with your health care provider. Document Released: 02/17/2009 Document Revised: 09/29/2015 Document Reviewed: 05/28/2014 Elsevier Interactive Patient Education  2017 [employer].

## 2021-12-22 NOTE — Progress Notes (Signed)
Subjective:   Ronnie Payne is a 81 y.o. male who presents for an Initial Medicare Annual Wellness Visit.  Review of Systems     Cardiac Risk Factors include: advanced age (>18mn, >>42women);hypertension;dyslipidemia;male gender     Objective:    Today's Vitals   12/20/21 1429  BP: 110/64  Pulse: 65  Resp: 16  Temp: 98 F (36.7 C)  SpO2: 96%  Weight: 150 lb (68 kg)  Height: '5\' 4"'$  (1.626 m)   Body mass index is 25.75 kg/m.     12/20/2021    2:14 PM 07/12/2021   10:11 AM 06/29/2021    9:00 AM 06/28/2021   11:59 AM 10/19/2020    3:42 PM 10/16/2019    3:21 PM 09/10/2017   10:57 AM  Advanced Directives  Does Patient Have a Medical Advance Directive? Yes No No Yes Yes Yes Yes  Type of AParamedicof [place]Living will    [place] will [place]Living will [place] MountainLiving will  Does patient want to make changes to medical advance directive? No - Patient declined No - Patient declined No - Patient declined No - Patient declined  No - Patient declined   Copy of HPembrokein Chart? Yes - validated most recent copy scanned in chart (See row information)    No - copy requested No - copy requested No - copy requested  Would patient like information on creating a medical advance directive?  No - Patient declined No - Patient declined        Current Medications (verified) Outpatient Encounter Medications as of 12/20/2021  Medication Sig   Ascorbic Acid (VITAMIN C PO) Take 1 tablet by mouth daily.   atenolol (TENORMIN) 50 MG tablet Take 1 tablet (50 mg total) by mouth daily.   atorvastatin (LIPITOR) 20 MG tablet Take 1 tablet (20 mg total) by mouth every evening.   carboxymethylcellulose (REFRESH PLUS) 0.5 % SOLN Place 1 drop into both eyes 3 (three) times daily as needed (dry eyes).   Cholecalciferol (VITAMIN D3 PO) Take 1 tablet by mouth daily.   MYRBETRIQ 50 MG TB24  tablet Take 50 mg by mouth daily at 12 noon.   temazepam (RESTORIL) 15 MG capsule TAKE 1 CAPSULE BY MOUTH EVERY NIGHT AT BEDTIME FOR SLEEP.   No facility-administered encounter medications on file as of 12/20/2021.    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   Procedure: TOTAL HIP ARTHROPLASTY ANTERIOR APPROACH;  Surgeon: RFrederik Pear MD;  [facility]  Service: Orthopedics;  Laterality: Left;   TRANSURETHRAL RESECTION OF PROSTATE  2000-2012   Family History  Problem Relation Age of Onset   Thyroid cancer Mother    Emphysema Father    Lung cancer Sister    Colon cancer Neg Hx    Colon polyps Neg Hx    Esophageal cancer Neg Hx    Stomach cancer Neg Hx    Rectal cancer Neg Hx    Social History   Socioeconomic History   Marital status:  Married    Spouse name: Not on file   Number of children: 1   Years of education: Not on file   Highest education level: Not on file  Occupational History   Occupation: retired    Comment: Optometrist  Tobacco Use   Smoking status: Never   Smokeless tobacco: Never  Vaping Use   Vaping Use: Never used  Substance and Sexual Activity   Alcohol use: No   Drug use: No   Sexual activity: Yes  Other Topics Concern   Not on file  Social History Narrative   Not on file   Social Determinants of Health   Financial Resource Strain: Low Risk  (10/19/2020)   Overall Financial Resource Strain (CARDIA)    Difficulty of Paying Living Expenses: Not hard at all  Food Insecurity: No Food Insecurity (10/19/2020)   Hunger Vital Sign    Worried About Running Out of Food in the Last Year: Never  true    [place] in the Last Year: Never true  Transportation Needs: No Transportation Needs (10/19/2020)   PRAPARE - Hydrologist (Medical): No    Lack of Transportation (Non-Medical): No  Physical Activity: Sufficiently Active (10/19/2020)   Exercise Vital Sign    Days of Exercise per Week: 3 days    Minutes of Exercise per Session: 60 min  Stress: No Stress Concern Present (10/19/2020)   Gap    Feeling of Stress : Not at all  Social Connections: Moderately Integrated (10/19/2020)   Social Connection and Isolation Panel [NHANES]    Frequency of Communication with Friends and Family: More than three times a week    Frequency of Social Gatherings with Friends and Family: More than three times a week    Attends Religious Services: More than 4 times per year    Active Member of [employer] or Organizations: No    Attends Music therapist: Never    Marital Status: Married    Tobacco Counseling Counseling given: Not Answered   Clinical Intake:  Pre-visit preparation completed: Yes  Pain : No/denies pain     BMI - recorded: 25.41 Nutritional Status: BMI 25 -29 Overweight Nutritional Risks: None Diabetes: No  How often do you need to have someone help you when you read instructions, pamphlets, or other written materials from your doctor or pharmacy?: 1 - Never  Diabetic?no  Interpreter Needed?: No  Information entered by :: Damesha Lawler   Activities of Daily Living    12/20/2021    2:17 PM 06/29/2021    9:00 AM  In your present state of health, do you have any difficulty performing the following activities:  Hearing? 0 0  Vision? 0 0  Difficulty concentrating or making decisions? 0 1  Walking or climbing stairs? 0 0  Dressing or bathing? 0 0  Doing errands, shopping? 0 0  Preparing Food and eating ? N   Using the Toilet? N   In the past six months,  have you accidently leaked urine? N   Do you have problems with loss of bowel control? N   Managing your Medications? N   Managing your Finances? N   Housekeeping or managing your Housekeeping? N     Patient Care Team: Shelda Pal, DO as PCP - General (Family Medicine)  Indicate any recent Medical Services you may have received from other than [facility] providers in the past year (date  may be approximate).     Assessment:   This is a routine wellness examination for [facility].  Hearing/Vision screen No results found.  Dietary issues and exercise activities discussed: Current Exercise Habits: Home exercise routine, Type of exercise: strength training/weights;stretching;walking, Time (Minutes): 60, Frequency (Times/Week): 3, Weekly Exercise (Minutes/Week): 180, Intensity: Mild, Exercise limited by: None identified   Goals Addressed   None   Depression Screen    12/20/2021    2:15 PM 12/21/2020   10:06 AM 10/19/2020    3:47 PM 10/16/2019    3:26 PM 07/11/2017    8:56 AM 04/25/2016    8:33 AM  PHQ 2/9 Scores  PHQ - 2 Score 0 0 0 0 0 0    Fall Risk    12/20/2021    2:15 PM 12/21/2020   10:06 AM 10/19/2020    3:46 PM 10/16/2019    3:26 PM 12/05/2018    3:08 PM  Singer in the past year? 0 0 0 0 0  Comment     Emmi Telephone Survey: data to providers prior to load  Number falls in past yr: 0 0 0 0   Injury with Fall? 0 0 0 0   Risk for fall due to : No Fall Risks      Follow up Falls evaluation completed Falls evaluation completed Falls prevention discussed Education provided;Falls prevention discussed     FALL RISK PREVENTION PERTAINING TO THE HOME:  Any stairs in or around the home? Yes  If so, are there any without handrails? No  Home free of loose throw rugs in walkways, pet beds, electrical cords, etc? Yes  Adequate lighting in your home to reduce risk of falls? Yes   ASSISTIVE DEVICES UTILIZED TO PREVENT FALLS:  Life alert? No  Use of a cane, walker  or w/c? No  Grab bars in the bathroom? Yes  Shower chair or bench in shower? Yes  Elevated toilet seat or a handicapped toilet? Yes   TIMED UP AND GO:  Was the test performed? Yes .  Length of time to ambulate 10 feet: 10 sec.   Gait steady and fast without use of assistive device  Cognitive Function:    07/11/2017    8:56 AM  MMSE - Mini Mental State Exam  Orientation to time 5  Orientation to Place 5  Registration 3  Attention/ Calculation 5  Recall 2  Language- name 2 objects 2  Language- repeat 1  Language- follow 3 step command 3  Language- read & follow direction 1  Write a sentence 1  Copy design 1  Total score 29        12/20/2021    2:20 PM  6CIT Screen  What Year? 0 points  What month? 0 points  What time? 0 points  Count back from 20 0 points  Months in reverse 0 points  Repeat phrase 0 points  Total Score 0 points   Immunizations Immunization History  Administered Date(s) Administered   Influenza, High Dose Seasonal PF 01/06/2016, 02/19/2017, 02/03/2018   PFIZER(Purple Top)SARS-COV-2 Vaccination 07/15/2019, 08/11/2019   Pneumococcal Polysaccharide-23 02/19/2017   Pneumococcal-Unspecified 02/05/2015   Tdap 09/22/2018    TDAP status: Up to date  Flu Vaccine status: Due, Education has been provided regarding the importance of this vaccine. Advised may receive this vaccine at local pharmacy or Health Dept. Aware to provide a copy of the vaccination record if obtained from local pharmacy or Health Dept. Verbalized acceptance and understanding.  Pneumococcal vaccine status: Up to date  Covid-19 vaccine status: Information provided on how to obtain vaccines.   Qualifies for Shingles Vaccine? Yes   Zostavax completed No   Shingrix Completed?: No.    Education has been provided regarding the importance of this vaccine. Patient has been advised to call insurance company to determine out of pocket expense if they have not yet received this vaccine. Advised  may also receive vaccine at local pharmacy or Health Dept. Verbalized acceptance and understanding.  Screening Tests Health Maintenance  Topic Date Due   Pneumonia Vaccine 73+ Years old (2 - PCV) 02/19/2018   INFLUENZA VACCINE  12/05/2021   TETANUS/TDAP  09/21/2028   HPV VACCINES  Aged Out   COVID-19 Vaccine  Discontinued   Zoster Vaccines- Shingrix  Discontinued    Health Maintenance  Health Maintenance Due  Topic Date Due   Pneumonia Vaccine 28+ Years old (2 - PCV) 02/19/2018   INFLUENZA VACCINE  12/05/2021    Colorectal cancer screening: No longer required.   Lung Cancer Screening: (Low Dose CT Chest recommended if Age 46-80 years, 30 pack-year currently smoking OR have quit w/in 15years.) does not qualify.   Lung Cancer Screening Referral: n/a  Additional Screening:  Hepatitis C Screening: does not qualify; Completed aged out  Vision Screening: Recommended annual ophthalmology exams for early detection of glaucoma and other disorders of the eye. Is the patient up to date with their annual eye exam?  No  Who is the provider or what is the name of the office in which the patient attends annual eye exams? Dr. Nicki Reaper If pt is not established with a provider, would they like to be referred to a provider to establish care? No .   Dental Screening: Recommended annual dental exams for proper oral hygiene  Community Resource Referral / Chronic Care Management: CRR required this visit?  No   CCM required this visit?  No      Plan:     I have personally reviewed and noted the following in the patient's chart:   Medical and social history Use of alcohol, tobacco or illicit drugs  Current medications and supplements including opioid prescriptions. Patient is currently taking opioid prescriptions. Information provided to patient regarding non-opioid alternatives. Patient advised to discuss non-opioid treatment plan with their provider. Functional ability and  status Nutritional status Physical activity Advanced directives List of other physicians Hospitalizations, surgeries, and ER visits in previous 12 months Vitals Screenings to include cognitive, depression, and falls Referrals and appointments  In addition, I have reviewed and discussed with patient certain preventive protocols, quality metrics, and best practice recommendations. A written personalized care plan for preventive services as well as general preventive health recommendations were provided to patient.     Duard Brady Alyss Granato, [place]   12/22/2021   Nurse Notes: none

## 2022-01-09 DIAGNOSIS — R350 Frequency of micturition: Secondary | ICD-10-CM | POA: Diagnosis not present

## 2022-01-09 DIAGNOSIS — N5201 Erectile dysfunction due to arterial insufficiency: Secondary | ICD-10-CM | POA: Diagnosis not present

## 2022-01-16 DIAGNOSIS — L2089 Other atopic dermatitis: Secondary | ICD-10-CM | POA: Diagnosis not present

## 2022-01-16 DIAGNOSIS — L820 Inflamed seborrheic keratosis: Secondary | ICD-10-CM | POA: Diagnosis not present

## 2022-01-16 DIAGNOSIS — D485 Neoplasm of uncertain behavior of skin: Secondary | ICD-10-CM | POA: Diagnosis not present

## 2022-01-16 DIAGNOSIS — C44222 Squamous cell carcinoma of skin of right ear and external auricular canal: Secondary | ICD-10-CM | POA: Diagnosis not present

## 2022-01-16 DIAGNOSIS — L818 Other specified disorders of pigmentation: Secondary | ICD-10-CM | POA: Diagnosis not present

## 2022-01-22 ENCOUNTER — Encounter: Payer: Self-pay | Admitting: Family Medicine

## 2022-01-22 ENCOUNTER — Other Ambulatory Visit (INDEPENDENT_AMBULATORY_CARE_PROVIDER_SITE_OTHER): Payer: Medicare Other

## 2022-01-22 DIAGNOSIS — E785 Hyperlipidemia, unspecified: Secondary | ICD-10-CM

## 2022-01-22 LAB — LIPID PANEL
Cholesterol: 158 mg/dL (ref 0–200)
HDL: 48.9 mg/dL (ref >39.00)
LDL Cholesterol: 89 mg/dL (ref 0–99)
NonHDL: 108.71
Total CHOL/HDL Ratio: 3
Triglycerides: 99 mg/dL (ref 0.0–149.0)
VLDL: 19.8 mg/dL (ref 0.0–40.0)

## 2022-01-23 ENCOUNTER — Encounter: Payer: Self-pay | Admitting: Family Medicine

## 2022-01-29 ENCOUNTER — Ambulatory Visit (INDEPENDENT_AMBULATORY_CARE_PROVIDER_SITE_OTHER): Payer: Medicare Other | Admitting: Family Medicine

## 2022-01-29 ENCOUNTER — Encounter: Payer: Self-pay | Admitting: Family Medicine

## 2022-01-29 VITALS — BP 120/74 | HR 66 | Temp 97.4°F | Ht 64.0 in | Wt 150.0 lb

## 2022-01-29 DIAGNOSIS — M542 Cervicalgia: Secondary | ICD-10-CM

## 2022-01-29 DIAGNOSIS — G4700 Insomnia, unspecified: Secondary | ICD-10-CM

## 2022-01-29 MED ORDER — DIAZEPAM 5 MG PO TABS: 5.0000 mg | ORAL_TABLET | Freq: Two times a day (BID) | ORAL | 0 refills | Status: DC | PRN

## 2022-01-29 MED ORDER — NORTRIPTYLINE HCL 10 MG PO CAPS: 10.0000 mg | ORAL_CAPSULE | Freq: Two times a day (BID) | ORAL | 2 refills | Status: DC | PRN

## 2022-01-29 NOTE — Progress Notes (Signed)
Musculoskeletal Exam  Patient: Ronnie Payne DOB: 10/10/1940  DOS: 01/29/2022  SUBJECTIVE:  Chief Complaint:   Chief Complaint  Patient presents with   Neck Pain    Not sleeping well    Ronnie Payne is a 81 y.o.  male for evaluation and treatment of neck pain.   Onset:  3 weeks ago. No inj or change in activity.  Location: L posterior Character:  sharp Progression of issue:  has worsened Associated symptoms: limited ROM No bruising, swelling, redness Treatment: to date has been OTC NSAIDS.   Neurovascular symptoms: no  Insomnia The patient has a history of insomnia.  He has been using an over-the-counter sleep aid with minimal relief as it only lasts around 2 and half hours.  He does not feel well rested and does not nap.  No alcohol use or caffeine intake after noon.  He has failed doxepin, trazodone, and is trying to get away from temazepam.  Past Medical History:  Diagnosis Date   Arthritis    Cold sore    Frequent headaches    History of skin cancer    Hyperlipidemia    Hypertension    Prediabetes     Objective: VITAL SIGNS: BP 120/74 (BP Location: Left Arm)   Pulse 66   Temp (!) 97.4 F (36.3 C) (Oral)   Ht '5\' 4"'$  (1.626 m)   Wt 150 lb (68 kg)   SpO2 97%   BMI 25.75 kg/m  Constitutional: Well formed, well developed. No acute distress. Thorax & Lungs: No accessory muscle use Musculoskeletal: neck.   Normal active range of motion: yes.   Normal passive range of motion: yes Tenderness to palpation: Yes over the lateral portion of the left paraspinal cervical musculature Deformity: no Ecchymosis: no Tests positive: none Tests negative: Spurling's Neurologic: Normal sensory function. No focal deficits noted. DTR's equal and symmetric in UE's. No clonus.  Grip strength 5/5 bilaterally. Psychiatric: Normal mood. Age appropriate judgment and insight. Alert & oriented x 3.    Assessment:  Acute neck pain - Plan: diazepam (VALIUM) 5 MG  tablet  Insomnia, unspecified type - Plan: nortriptyline (PAMELOR) 10 MG capsule  Plan: He has not been using temazepam, we will use low-dose Valium for a muscle relaxer.  Stretches/exercises, heat, ice, Tylenol.  Chronic, uncontrolled.  Nortriptyline 10 mg nightly.  Follow-up in 1 month, if no improvement will consider orexin inhibitor. The patient voiced understanding and agreement to the plan.   [place], DO 01/29/22  2:53 PM

## 2022-01-29 NOTE — Patient Instructions (Signed)
Ice/cold pack over area for 10-15 min twice daily.  Heat (pad or rice pillow in microwave) over affected area, 10-15 minutes twice daily.   OK to take Tylenol 1000 mg (2 extra strength tabs) or 975 mg (3 regular strength tabs) every 6 hours as needed.  Let us know if you need anything.  EXERCISES RANGE OF MOTION (ROM) AND STRETCHING EXERCISES  These exercises may help you when beginning to rehabilitate your issue. In order to successfully resolve your symptoms, you must improve your posture. These exercises are designed to help reduce the forward-head and rounded-shoulder posture which contributes to this condition. Your symptoms may resolve with or without further involvement from your physician, physical therapist or athletic trainer. While completing these exercises, remember:  Restoring tissue flexibility helps normal motion to return to the joints. This allows healthier, less painful movement and activity. An effective stretch should be held for at least 20 seconds, although you may need to begin with shorter hold times for comfort. A stretch should never be painful. You should only feel a gentle lengthening or release in the stretched tissue. Do not do any stretch or exercise that you cannot tolerate.  STRETCH- Axial Extensors Lie on your back on the floor. You may bend your knees for comfort. Place a rolled-up hand towel or dish towel, about 2 inches in diameter, under the part of your head that makes contact with the floor. Gently tuck your chin, as if trying to make a "double chin," until you feel a gentle stretch at the base of your head. Hold 15-20 seconds. Repeat 2-3 times. Complete this exercise 1 time per day.   STRETCH - Axial Extension  Stand or sit on a firm surface. Assume a good posture: chest up, shoulders drawn back, abdominal muscles slightly tense, knees unlocked (if standing) and feet hip width apart. Slowly retract your chin so your head slides back and your chin  slightly lowers. Continue to look straight ahead. You should feel a gentle stretch in the back of your head. Be certain not to feel an aggressive stretch since this can cause headaches later. Hold for 15-20 seconds. Repeat 2-3 times. Complete this exercise 1 time per day.  STRETCH - Cervical Side Bend  Stand or sit on a firm surface. Assume a good posture: chest up, shoulders drawn back, abdominal muscles slightly tense, knees unlocked (if standing) and feet hip width apart. Without letting your nose or shoulders move, slowly tip your right / left ear to your shoulder until your feel a gentle stretch in the muscles on the opposite side of your neck. Hold 15-20 seconds. Repeat 2-3 times. Complete this exercise 1-2 times per day.  STRETCH - Cervical Rotators  Stand or sit on a firm surface. Assume a good posture: chest up, shoulders drawn back, abdominal muscles slightly tense, knees unlocked (if standing) and feet hip width apart. Keeping your eyes level with the ground, slowly turn your head until you feel a gentle stretch along the back and opposite side of your neck. Hold 15-20 seconds. Repeat 2-3 times. Complete this exercise 1-2 times per day.  RANGE OF MOTION - Neck Circles  Stand or sit on a firm surface. Assume a good posture: chest up, shoulders drawn back, abdominal muscles slightly tense, knees unlocked (if standing) and feet hip width apart. Gently roll your head down and around from the back of one shoulder to the back of the other. The motion should never be forced or painful. Repeat the motion  10-20 times, or until you feel the neck muscles relax and loosen. Repeat 2-3 times. Complete the exercise 1-2 times per day. STRENGTHENING EXERCISES - Cervical Strain and Sprain These exercises may help you when beginning to rehabilitate your injury. They may resolve your symptoms with or without further involvement from your physician, physical therapist, or athletic trainer. While  completing these exercises, remember:  Muscles can gain both the endurance and the strength needed for everyday activities through controlled exercises. Complete these exercises as instructed by your physician, physical therapist, or athletic trainer. Progress the resistance and repetitions only as guided. You may experience muscle soreness or fatigue, but the pain or discomfort you are trying to eliminate should never worsen during these exercises. If this pain does worsen, stop and make certain you are following the directions exactly. If the pain is still present after adjustments, discontinue the exercise until you can discuss the trouble with your clinician.  STRENGTH - Cervical Flexors, Isometric Face a wall, standing about 6 inches away. Place a small pillow, a ball about 6-8 inches in diameter, or a folded towel between your forehead and the wall. Slightly tuck your chin and gently push your forehead into the soft object. Push only with mild to moderate intensity, building up tension gradually. Keep your jaw and forehead relaxed. Hold 10 to 20 seconds. Keep your breathing relaxed. Release the tension slowly. Relax your neck muscles completely before you start the next repetition. Repeat 2-3 times. Complete this exercise 1 time per day.  STRENGTH- Cervical Lateral Flexors, Isometric  Stand about 6 inches away from a wall. Place a small pillow, a ball about 6-8 inches in diameter, or a folded towel between the side of your head and the wall. Slightly tuck your chin and gently tilt your head into the soft object. Push only with mild to moderate intensity, building up tension gradually. Keep your jaw and forehead relaxed. Hold 10 to 20 seconds. Keep your breathing relaxed. Release the tension slowly. Relax your neck muscles completely before you start the next repetition. Repeat 2-3 times. Complete this exercise 1 time per day.  STRENGTH - Cervical Extensors, Isometric  Stand about 6 inches  away from a wall. Place a small pillow, a ball about 6-8 inches in diameter, or a folded towel between the back of your head and the wall. Slightly tuck your chin and gently tilt your head back into the soft object. Push only with mild to moderate intensity, building up tension gradually. Keep your jaw and forehead relaxed. Hold 10 to 20 seconds. Keep your breathing relaxed. Release the tension slowly. Relax your neck muscles completely before you start the next repetition. Repeat 2-3 times. Complete this exercise 1 time per day.  POSTURE AND BODY MECHANICS CONSIDERATIONS Keeping correct posture when sitting, standing or completing your activities will reduce the stress put on different body tissues, allowing injured tissues a chance to heal and limiting painful experiences. The following are general guidelines for improved posture. Your physician or physical therapist will provide you with any instructions specific to your needs. While reading these guidelines, remember: The exercises prescribed by your provider will help you have the flexibility and strength to maintain correct postures. The correct posture provides the optimal environment for your joints to work. All of your joints have less wear and tear when properly supported by a spine with good posture. This means you will experience a healthier, less painful body. Correct posture must be practiced with all of your activities, especially   prolonged sitting and standing. Correct posture is as important when doing repetitive low-stress activities (typing) as it is when doing a single heavy-load activity (lifting).  PROLONGED STANDING WHILE SLIGHTLY LEANING FORWARD When completing a task that requires you to lean forward while standing in one place for a long time, place either foot up on a stationary 2- to 4-inch high object to help maintain the best posture. When both feet are on the ground, the low back tends to lose its slight inward curve. If  this curve flattens (or becomes too large), then the back and your other joints will experience too much stress, fatigue more quickly, and can cause pain.   RESTING POSITIONS Consider which positions are most painful for you when choosing a resting position. If you have pain with flexion-based activities (sitting, bending, stooping, squatting), choose a position that allows you to rest in a less flexed posture. You would want to avoid curling into a fetal position on your side. If your pain worsens with extension-based activities (prolonged standing, working overhead), avoid resting in an extended position such as sleeping on your stomach. Most people will find more comfort when they rest with their spine in a more neutral position, neither too rounded nor too arched. Lying on a non-sagging bed on your side with a pillow between your knees, or on your back with a pillow under your knees will often provide some relief. Keep in mind, being in any one position for a prolonged period of time, no matter how correct your posture, can still lead to stiffness.  WALKING Walk with an upright posture. Your ears, shoulders, and hips should all line up. OFFICE WORK When working at a desk, create an environment that supports good, upright posture. Without extra support, muscles fatigue and lead to excessive strain on joints and other tissues.  CHAIR: A chair should be able to slide under your desk when your back makes contact with the back of the chair. This allows you to work closely. The chair's height should allow your eyes to be level with the upper part of your monitor and your hands to be slightly lower than your elbows. Body position: Your feet should make contact with the floor. If this is not possible, use a foot rest. Keep your ears over your shoulders. This will reduce stress on your neck and low back.  

## 2022-02-05 ENCOUNTER — Encounter: Payer: Self-pay | Admitting: Family Medicine

## 2022-02-06 ENCOUNTER — Other Ambulatory Visit: Payer: Self-pay | Admitting: Family Medicine

## 2022-02-06 MED ORDER — CYCLOBENZAPRINE HCL 10 MG PO TABS: 5.0000 mg | ORAL_TABLET | Freq: Three times a day (TID) | ORAL | 0 refills | Status: DC | PRN

## 2022-02-07 DIAGNOSIS — D0421 Carcinoma in situ of skin of right ear and external auricular canal: Secondary | ICD-10-CM | POA: Diagnosis not present

## 2022-02-26 ENCOUNTER — Ambulatory Visit: Payer: Medicare Other | Admitting: Family Medicine

## 2022-03-12 ENCOUNTER — Ambulatory Visit (INDEPENDENT_AMBULATORY_CARE_PROVIDER_SITE_OTHER): Payer: Medicare Other | Admitting: Family Medicine

## 2022-03-12 ENCOUNTER — Encounter: Payer: Self-pay | Admitting: Family Medicine

## 2022-03-12 VITALS — BP 180/82 | HR 76 | Temp 97.8°F | Resp 18 | Ht 64.0 in | Wt 146.0 lb

## 2022-03-12 DIAGNOSIS — I10 Essential (primary) hypertension: Secondary | ICD-10-CM | POA: Diagnosis not present

## 2022-03-12 MED ORDER — LOSARTAN POTASSIUM 50 MG PO TABS: 50.0000 mg | ORAL_TABLET | Freq: Every day | ORAL | 1 refills | Status: DC

## 2022-03-12 NOTE — Patient Instructions (Signed)

## 2022-03-12 NOTE — Progress Notes (Signed)
Subjective:   By signing my name below, I, Ronnie Payne, attest that this documentation has been prepared under the direction and in the presence of Ann Held DO 03/12/2022   Patient ID: Ronnie Payne, male    DOB: November 17, 1940, 81 y.o.   MRN: [id number]  Chief Complaint  Patient presents with   Hypertension    Pt states his blood pressure has been running high over the weekend. Pt states he has been increasing his meds on his own over the weekend. Pt states having headache and unable to sleep. Pt states taking 50 MG Atenolol in the morning and taking a 1/2 tab at lunch and 1/2 tab in the afternoon   Follow-up    Hypertension Pertinent negatives include no chest pain, headaches, malaise/fatigue, palpitations or shortness of breath.   Patient is in today for an office visit  He complains of abnormal blood pressure readings. He was taking 25 Mg of Atenolol for more than a decade until he had to increase his dosage to 50 mg due to increased blood pressure. However, as of recently, his blood pressure is increasing abnormally. He reports of blood pressures of 180/90 and increasing. He has tried to increase his medication to 75 mg but symptoms are persistent. He notes that on the night of 03/09/2022, he took a Tylenol due to a headache. He woke up with a painful headache during the night, he then took another Tylenol and was then able to fall back asleep. He is able to be active without any known side effects. He denies of any chest pain, SOB or palpitations at this moment. He has previously tried Metropolol which caused swelling in his leg. He has also tried Lisinopril which caused him to cough.  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   Procedure: TOTAL HIP ARTHROPLASTY ANTERIOR APPROACH;  Surgeon: Frederik Pear, MD;  Location: [facility];  Service: Orthopedics;  Laterality: Left;   TRANSURETHRAL RESECTION OF PROSTATE  2000-2012    Family History  Problem Relation Age of Onset   Thyroid cancer Mother    Emphysema Father    Lung cancer Sister    Colon cancer Neg Hx    Colon polyps Neg Hx    Esophageal cancer Neg Hx    Stomach cancer Neg Hx    Rectal cancer Neg Hx     Social History   Socioeconomic History   Marital status: Married    Spouse name: Not on file   Number of children: 1   Years of education: Not on file   Highest education level: Not on file  Occupational History   Occupation: retired    Comment: Optometrist  Tobacco Use   Smoking status: Never   Smokeless tobacco: Never  Vaping Use   Vaping Use: Never used  Substance and Sexual Activity   Alcohol use: No   Drug use: No   Sexual activity:  Yes  Other Topics Concern   Not on file  Social History Narrative   Not on file   Social Determinants of Health   Financial Resource Strain: Low Risk  (10/19/2020)   Overall Financial Resource Strain (CARDIA)    Difficulty of Paying Living Expenses: Not hard at all  Food Insecurity: No Food Insecurity (10/19/2020)   Hunger Vital Sign    Worried About Running Out of Food in the Last Year: Never true    Ran Out of Food in the Last Year: Never true  Transportation Needs: No Transportation Needs (10/19/2020)   PRAPARE - Hydrologist (Medical): No    Lack of Transportation (Non-Medical): No  Physical Activity: Sufficiently Active (10/19/2020)   Exercise Vital Sign    Days of Exercise per Week: 3 days    Minutes of Exercise per Session: 60 min  Stress: No Stress  Concern Present (10/19/2020)   [place]    Feeling of Stress : Not at all  Social Connections: Moderately Integrated (10/19/2020)   Social Connection and Isolation Panel [NHANES]    Frequency of Communication with Friends and Family: More than three times a week    Frequency of Social Gatherings with Friends and Family: More than three times a week    Attends Religious Services: More than 4 times per year    Active Member of [employer] or Organizations: No    Attends Archivist Meetings: Never    Marital Status: Married  Human resources officer Violence: Not At Risk (10/19/2020)   Humiliation, Afraid, Rape, and Kick questionnaire    Fear of Current or Ex-Partner: No    Emotionally Abused: No    Physically Abused: No    Sexually Abused: No    Outpatient Medications Prior to Visit  Medication Sig Dispense Refill   atenolol (TENORMIN) 50 MG tablet Take 1 tablet (50 mg total) by mouth daily. 90 tablet 3   atorvastatin (LIPITOR) 20 MG tablet Take 1 tablet (20 mg total) by mouth every evening. 90 tablet 3   cyclobenzaprine (FLEXERIL) 10 MG tablet Take 0.5-1 tablets (5-10 mg total) by mouth 3 (three) times daily as needed for muscle spasms. 21 tablet 0   MYRBETRIQ 50 MG TB24 tablet Take 50 mg by mouth daily at 12 noon.     temazepam (RESTORIL) 15 MG capsule TAKE 1 CAPSULE BY MOUTH EVERY NIGHT AT BEDTIME FOR SLEEP. 30 capsule 3   carboxymethylcellulose (REFRESH PLUS) 0.5 % SOLN Place 1 drop into both eyes 3 (three) times daily as needed (dry eyes). (Patient not taking: Reported on 03/12/2022)     nortriptyline (PAMELOR) 10 MG capsule Take 1 capsule (10 mg total) by mouth 3 times/day as needed-between meals & bedtime for sleep. 30 capsule 2   No facility-administered medications prior to visit.    Allergies  Allergen Reactions   Doxycycline     Unknown reaction   Zanaflex [Tizanidine] Other (See Comments)   Tape Rash    Paper  tape ok    Review of Systems  Constitutional:  Negative for chills, fever and malaise/fatigue.       (+) Hypertension  HENT:  Negative for congestion and hearing loss.   Eyes:  Negative for discharge.  Respiratory:  Negative for cough, sputum production and shortness of breath.   Cardiovascular:  Negative for chest pain, palpitations and leg swelling.  Gastrointestinal:  Negative for abdominal pain, blood in stool, constipation, diarrhea,  heartburn, nausea and vomiting.  Genitourinary:  Negative for dysuria, frequency, hematuria and urgency.  Musculoskeletal:  Negative for back pain, falls and myalgias.  Skin:  Negative for rash.  Neurological:  Negative for dizziness, sensory change, loss of consciousness, weakness and headaches.  Endo/Heme/Allergies:  Negative for environmental allergies. Does not bruise/bleed easily.  Psychiatric/Behavioral:  Negative for depression and suicidal ideas. The patient is not nervous/anxious and does not have insomnia.    EKG-- sinus brady     Objective:    Physical Exam Vitals and nursing note reviewed.  Constitutional:      General: He is not in acute distress.    Appearance: Normal appearance. He is well-developed. He is not ill-appearing.  HENT:     Head: Normocephalic and atraumatic.     Right Ear: External ear normal.     Left Ear: External ear normal.  Eyes:     Extraocular Movements: Extraocular movements intact.     Pupils: Pupils are equal, round, and reactive to light.  Neck:     Thyroid: No thyromegaly.  Cardiovascular:     Rate and Rhythm: Normal rate and regular rhythm.     Heart sounds: Normal heart sounds. No murmur heard.    No gallop.     Comments: EKG is normal.   Pulmonary:     Effort: Pulmonary effort is normal. No respiratory distress.     Breath sounds: Normal breath sounds. No wheezing or rales.  Chest:     Chest wall: No tenderness.  Musculoskeletal:     Cervical back: Normal range of motion and neck supple.      Right hip: Tenderness present. Normal range of motion. Normal strength.     Left hip: Tenderness present. Normal range of motion. Normal strength.     Right lower leg: No edema.     Left lower leg: No edema.     Right foot: Bony tenderness present. No swelling.     Left foot: Bony tenderness present. No swelling.  Skin:    General: Skin is warm and dry.  Neurological:     Mental Status: He is alert and oriented to person, place, and time.  Psychiatric:        Behavior: Behavior normal.        Thought Content: Thought content normal.        Judgment: Judgment normal.     BP (!) 180/82 (BP Location: Left Arm, Patient Position: Sitting, Cuff Size: Normal)   Pulse 76   Temp 97.8 F (36.6 C) (Oral)   Resp 18   Ht '5\' 4"'$  (1.626 m)   Wt 146 lb (66.2 kg)   SpO2 97%   BMI 25.06 kg/m  Wt Readings from Last 3 Encounters:  03/12/22 146 lb (66.2 kg)  01/29/22 150 lb (68 kg)  12/20/21 150 lb (68 kg)    Diabetic Foot Exam - Simple   No data filed    Lab Results  Component Value Date   WBC 7.1 06/29/2021   HGB 14.5 06/29/2021   HCT 41.4 06/29/2021   PLT 167 06/29/2021   GLUCOSE 96 12/19/2021   CHOL 158 01/22/2022   TRIG 99.0 01/22/2022   HDL 48.90 01/22/2022   LDLDIRECT 98.0 12/19/2021   LDLCALC 89 01/22/2022   ALT 20 12/19/2021   AST 23 12/19/2021   NA 138 12/19/2021   K 4.1 12/19/2021   CL 101 12/19/2021   CREATININE 0.94 12/19/2021   BUN 21 12/19/2021   CO2 30 12/19/2021  INR 0.99 09/10/2017   HGBA1C 5.4 06/21/2021    No results found for: "TSH" Lab Results  Component Value Date   WBC 7.1 06/29/2021   HGB 14.5 06/29/2021   HCT 41.4 06/29/2021   MCV 91.0 06/29/2021   PLT 167 06/29/2021   Lab Results  Component Value Date   NA 138 12/19/2021   K 4.1 12/19/2021   CO2 30 12/19/2021   GLUCOSE 96 12/19/2021   BUN 21 12/19/2021   CREATININE 0.94 12/19/2021   BILITOT 0.8 12/19/2021   ALKPHOS 74 12/19/2021   AST 23 12/19/2021   ALT 20 12/19/2021   PROT 6.4  12/19/2021   ALBUMIN 4.4 12/19/2021   CALCIUM 9.3 12/19/2021   ANIONGAP 8 06/29/2021   GFR 76.10 12/19/2021   Lab Results  Component Value Date   CHOL 158 01/22/2022   Lab Results  Component Value Date   HDL 48.90 01/22/2022   Lab Results  Component Value Date   LDLCALC 89 01/22/2022   Lab Results  Component Value Date   TRIG 99.0 01/22/2022   Lab Results  Component Value Date   CHOLHDL 3 01/22/2022   Lab Results  Component Value Date   HGBA1C 5.4 06/21/2021       Assessment & Plan:   Problem List Items Addressed This Visit   None Visit Diagnoses     Primary hypertension    -  Primary   Relevant Medications   losartan (COZAAR) 50 MG tablet   Other Relevant Orders   ECHOCARDIOGRAM COMPLETE   EKG 12-Lead (Completed)      Meds ordered this encounter  Medications   losartan (COZAAR) 50 MG tablet    Sig: Take 1 tablet (50 mg total) by mouth daily.    Dispense:  90 tablet    Refill:  1    I, Ann Held, DO, personally preformed the services described in this documentation.  All medical record entries made by the scribe were at my direction and in my presence.  I have reviewed the chart and discharge instructions (if applicable) and agree that the record reflects my personal performance and is accurate and complete. 03/12/2022   I,Amber Collins,acting as a scribe for Ann Held, DO.,have documented all relevant documentation on the behalf of Ann Held, DO,as directed by  Ann Held, DO while in the presence of Ann Held, DO.    Ann Held, DO

## 2022-03-12 NOTE — Assessment & Plan Note (Addendum)
Take atenolol 50 mg daily----  do not take 50 mg bid Add losartan 50 mg daily   Chemistry      Component Value Date/Time   NA 138 12/19/2021 1001   K 4.1 12/19/2021 1001   CL 101 12/19/2021 1001   CO2 30 12/19/2021 1001   BUN 21 12/19/2021 1001   CREATININE 0.94 12/19/2021 1001      Component Value Date/Time   CALCIUM 9.3 12/19/2021 1001   ALKPHOS 74 12/19/2021 1001   AST 23 12/19/2021 1001   ALT 20 12/19/2021 1001   BILITOT 0.8 12/19/2021 1001    F/u pcp 2 weeks or sooner prn

## 2022-03-13 ENCOUNTER — Encounter: Payer: Self-pay | Admitting: Family Medicine

## 2022-03-21 ENCOUNTER — Ambulatory Visit (HOSPITAL_COMMUNITY): Payer: Medicare Other | Attending: Family Medicine

## 2022-03-21 DIAGNOSIS — R0609 Other forms of dyspnea: Secondary | ICD-10-CM

## 2022-03-21 DIAGNOSIS — I10 Essential (primary) hypertension: Secondary | ICD-10-CM | POA: Diagnosis not present

## 2022-03-21 LAB — ECHOCARDIOGRAM COMPLETE
Area-P 1/2: 2.99 cm2
S' Lateral: 2 cm

## 2022-03-22 DIAGNOSIS — M542 Cervicalgia: Secondary | ICD-10-CM | POA: Diagnosis not present

## 2022-03-22 DIAGNOSIS — M7062 Trochanteric bursitis, left hip: Secondary | ICD-10-CM | POA: Diagnosis not present

## 2022-04-10 DIAGNOSIS — M542 Cervicalgia: Secondary | ICD-10-CM | POA: Diagnosis not present

## 2022-04-10 DIAGNOSIS — S161XXD Strain of muscle, fascia and tendon at neck level, subsequent encounter: Secondary | ICD-10-CM | POA: Diagnosis not present

## 2022-04-18 DIAGNOSIS — S161XXD Strain of muscle, fascia and tendon at neck level, subsequent encounter: Secondary | ICD-10-CM | POA: Diagnosis not present

## 2022-04-18 DIAGNOSIS — M542 Cervicalgia: Secondary | ICD-10-CM | POA: Diagnosis not present

## 2022-04-20 DIAGNOSIS — M542 Cervicalgia: Secondary | ICD-10-CM | POA: Diagnosis not present

## 2022-04-20 DIAGNOSIS — S161XXD Strain of muscle, fascia and tendon at neck level, subsequent encounter: Secondary | ICD-10-CM | POA: Diagnosis not present

## 2022-04-23 DIAGNOSIS — S161XXD Strain of muscle, fascia and tendon at neck level, subsequent encounter: Secondary | ICD-10-CM | POA: Diagnosis not present

## 2022-04-23 DIAGNOSIS — M542 Cervicalgia: Secondary | ICD-10-CM | POA: Diagnosis not present

## 2022-04-26 ENCOUNTER — Encounter: Payer: Self-pay | Admitting: Family Medicine

## 2022-04-26 DIAGNOSIS — Z08 Encounter for follow-up examination after completed treatment for malignant neoplasm: Secondary | ICD-10-CM | POA: Diagnosis not present

## 2022-04-26 DIAGNOSIS — L821 Other seborrheic keratosis: Secondary | ICD-10-CM | POA: Diagnosis not present

## 2022-04-26 DIAGNOSIS — D0421 Carcinoma in situ of skin of right ear and external auricular canal: Secondary | ICD-10-CM | POA: Diagnosis not present

## 2022-04-26 DIAGNOSIS — Z86007 Personal history of in-situ neoplasm of skin: Secondary | ICD-10-CM | POA: Diagnosis not present

## 2022-04-26 DIAGNOSIS — L853 Xerosis cutis: Secondary | ICD-10-CM | POA: Diagnosis not present

## 2022-04-26 MED ORDER — LOSARTAN POTASSIUM 25 MG PO TABS: 25.0000 mg | ORAL_TABLET | Freq: Every day | ORAL | 2 refills | Status: DC

## 2022-05-02 DIAGNOSIS — S161XXD Strain of muscle, fascia and tendon at neck level, subsequent encounter: Secondary | ICD-10-CM | POA: Diagnosis not present

## 2022-05-02 DIAGNOSIS — M542 Cervicalgia: Secondary | ICD-10-CM | POA: Diagnosis not present

## 2022-05-08 DIAGNOSIS — S161XXD Strain of muscle, fascia and tendon at neck level, subsequent encounter: Secondary | ICD-10-CM | POA: Diagnosis not present

## 2022-05-08 DIAGNOSIS — M542 Cervicalgia: Secondary | ICD-10-CM | POA: Diagnosis not present

## 2022-05-10 DIAGNOSIS — S161XXD Strain of muscle, fascia and tendon at neck level, subsequent encounter: Secondary | ICD-10-CM | POA: Diagnosis not present

## 2022-05-10 DIAGNOSIS — M542 Cervicalgia: Secondary | ICD-10-CM | POA: Diagnosis not present

## 2022-05-15 DIAGNOSIS — M542 Cervicalgia: Secondary | ICD-10-CM | POA: Diagnosis not present

## 2022-05-15 DIAGNOSIS — S161XXD Strain of muscle, fascia and tendon at neck level, subsequent encounter: Secondary | ICD-10-CM | POA: Diagnosis not present

## 2022-05-17 ENCOUNTER — Ambulatory Visit (INDEPENDENT_AMBULATORY_CARE_PROVIDER_SITE_OTHER): Payer: Medicare Other | Admitting: Family Medicine

## 2022-05-17 ENCOUNTER — Encounter: Payer: Self-pay | Admitting: Family Medicine

## 2022-05-17 DIAGNOSIS — B001 Herpesviral vesicular dermatitis: Secondary | ICD-10-CM

## 2022-05-17 MED ORDER — ACYCLOVIR 400 MG PO TABS: ORAL_TABLET | ORAL | 2 refills | Status: DC

## 2022-05-17 NOTE — Progress Notes (Signed)
   Acute Office Visit  Subjective:     Patient ID: Ronnie Payne, male    DOB: [birth year], 82 y.o.   MRN: [id number]  Chief Complaint  Patient presents with   sore on lip    It's been going on for 5-6 days    HPI Patient is in today for sore on lip.   He reports that 5-6 days ago he started noticing itching/discomfort to lower lip, left corner. Reports a small bump has started coming up and it is starting to become painful to eat and brush his teeth. Reports he has had a cold sore in the past that responded to acyclovir. Denies any other lesions, rashes, fevers, chills, malaise.       ROS All review of systems negative except what is listed in the HPI      Objective:    BP (!) 122/46   Pulse 63   Temp (!) 97.5 F (36.4 C)   Resp 16   Ht '5\' 4"'$  (1.626 m)   Wt 143 lb (64.9 kg)   SpO2 99%   BMI 24.55 kg/m    Physical Exam Vitals reviewed.  Constitutional:      Appearance: Normal appearance.  HENT:     Mouth/Throat:   Skin:    General: Skin is warm and dry.  Neurological:     Mental Status: He is alert and oriented to person, place, and time.  Psychiatric:        Mood and Affect: Mood normal.        Behavior: Behavior normal.        Thought Content: Thought content normal.        Judgment: Judgment normal.     No results found for any visits on 05/17/22.      Assessment & Plan:   Problem List Items Addressed This Visit       Digestive   Cold sore Refill PRN acyclovir for flare Discussed supportive measures Patient aware of signs/symptoms requiring further/urgent evaluation.    Relevant Medications   acyclovir (ZOVIRAX) 400 MG tablet    Meds ordered this encounter  Medications   acyclovir (ZOVIRAX) 400 MG tablet    Sig: Take 3 times daily for 5 days when you have a flare on your lip.    Dispense:  30 tablet    Refill:  2    Order Specific Question:   Supervising Provider    Answer:   Penni Homans A [[id number]]    Return if symptoms  worsen or fail to improve.  Terrilyn Saver, NP

## 2022-05-17 NOTE — Patient Instructions (Signed)
Acylclovir sent in for cold sore flare You can also try over-the-counter Abreva as needed  Please contact office for follow-up if symptoms do not improve or worsen. Seek emergency care if symptoms become severe.

## 2022-05-18 ENCOUNTER — Ambulatory Visit: Payer: Medicare Other | Admitting: Family Medicine

## 2022-05-20 ENCOUNTER — Encounter: Payer: Self-pay | Admitting: Family Medicine

## 2022-05-21 DIAGNOSIS — S161XXD Strain of muscle, fascia and tendon at neck level, subsequent encounter: Secondary | ICD-10-CM | POA: Diagnosis not present

## 2022-05-21 DIAGNOSIS — M542 Cervicalgia: Secondary | ICD-10-CM | POA: Diagnosis not present

## 2022-05-24 DIAGNOSIS — D049 Carcinoma in situ of skin, unspecified: Secondary | ICD-10-CM | POA: Diagnosis not present

## 2022-05-31 ENCOUNTER — Other Ambulatory Visit: Payer: Self-pay | Admitting: Family Medicine

## 2022-05-31 DIAGNOSIS — I10 Essential (primary) hypertension: Secondary | ICD-10-CM

## 2022-05-31 DIAGNOSIS — E785 Hyperlipidemia, unspecified: Secondary | ICD-10-CM

## 2022-06-06 DIAGNOSIS — M542 Cervicalgia: Secondary | ICD-10-CM | POA: Diagnosis not present

## 2022-06-06 DIAGNOSIS — S161XXD Strain of muscle, fascia and tendon at neck level, subsequent encounter: Secondary | ICD-10-CM | POA: Diagnosis not present

## 2022-06-19 DIAGNOSIS — M542 Cervicalgia: Secondary | ICD-10-CM | POA: Diagnosis not present

## 2022-06-19 DIAGNOSIS — S161XXD Strain of muscle, fascia and tendon at neck level, subsequent encounter: Secondary | ICD-10-CM | POA: Diagnosis not present

## 2022-06-21 DIAGNOSIS — S161XXD Strain of muscle, fascia and tendon at neck level, subsequent encounter: Secondary | ICD-10-CM | POA: Diagnosis not present

## 2022-06-21 DIAGNOSIS — M542 Cervicalgia: Secondary | ICD-10-CM | POA: Diagnosis not present

## 2022-06-26 DIAGNOSIS — M542 Cervicalgia: Secondary | ICD-10-CM | POA: Diagnosis not present

## 2022-06-26 DIAGNOSIS — S161XXD Strain of muscle, fascia and tendon at neck level, subsequent encounter: Secondary | ICD-10-CM | POA: Diagnosis not present

## 2022-06-28 DIAGNOSIS — Z872 Personal history of diseases of the skin and subcutaneous tissue: Secondary | ICD-10-CM | POA: Diagnosis not present

## 2022-06-28 DIAGNOSIS — L821 Other seborrheic keratosis: Secondary | ICD-10-CM | POA: Diagnosis not present

## 2022-06-28 DIAGNOSIS — Z85828 Personal history of other malignant neoplasm of skin: Secondary | ICD-10-CM | POA: Diagnosis not present

## 2022-06-28 DIAGNOSIS — Z08 Encounter for follow-up examination after completed treatment for malignant neoplasm: Secondary | ICD-10-CM | POA: Diagnosis not present

## 2022-06-28 DIAGNOSIS — D485 Neoplasm of uncertain behavior of skin: Secondary | ICD-10-CM | POA: Diagnosis not present

## 2022-06-28 DIAGNOSIS — Z86007 Personal history of in-situ neoplasm of skin: Secondary | ICD-10-CM | POA: Diagnosis not present

## 2022-06-28 DIAGNOSIS — Z85820 Personal history of malignant melanoma of skin: Secondary | ICD-10-CM | POA: Diagnosis not present

## 2022-06-29 DIAGNOSIS — M542 Cervicalgia: Secondary | ICD-10-CM | POA: Diagnosis not present

## 2022-06-29 DIAGNOSIS — S161XXD Strain of muscle, fascia and tendon at neck level, subsequent encounter: Secondary | ICD-10-CM | POA: Diagnosis not present

## 2022-07-03 DIAGNOSIS — M542 Cervicalgia: Secondary | ICD-10-CM | POA: Diagnosis not present

## 2022-07-03 DIAGNOSIS — S161XXD Strain of muscle, fascia and tendon at neck level, subsequent encounter: Secondary | ICD-10-CM | POA: Diagnosis not present

## 2022-07-06 DIAGNOSIS — M542 Cervicalgia: Secondary | ICD-10-CM | POA: Diagnosis not present

## 2022-07-06 DIAGNOSIS — S161XXD Strain of muscle, fascia and tendon at neck level, subsequent encounter: Secondary | ICD-10-CM | POA: Diagnosis not present

## 2022-07-09 DIAGNOSIS — S161XXD Strain of muscle, fascia and tendon at neck level, subsequent encounter: Secondary | ICD-10-CM | POA: Diagnosis not present

## 2022-07-09 DIAGNOSIS — M542 Cervicalgia: Secondary | ICD-10-CM | POA: Diagnosis not present

## 2022-07-10 DIAGNOSIS — N5201 Erectile dysfunction due to arterial insufficiency: Secondary | ICD-10-CM | POA: Diagnosis not present

## 2022-07-10 DIAGNOSIS — R3915 Urgency of urination: Secondary | ICD-10-CM | POA: Diagnosis not present

## 2022-07-10 DIAGNOSIS — N401 Enlarged prostate with lower urinary tract symptoms: Secondary | ICD-10-CM | POA: Diagnosis not present

## 2022-07-13 DIAGNOSIS — S161XXD Strain of muscle, fascia and tendon at neck level, subsequent encounter: Secondary | ICD-10-CM | POA: Diagnosis not present

## 2022-07-13 DIAGNOSIS — M542 Cervicalgia: Secondary | ICD-10-CM | POA: Diagnosis not present

## 2022-07-16 DIAGNOSIS — S161XXD Strain of muscle, fascia and tendon at neck level, subsequent encounter: Secondary | ICD-10-CM | POA: Diagnosis not present

## 2022-07-16 DIAGNOSIS — M542 Cervicalgia: Secondary | ICD-10-CM | POA: Diagnosis not present

## 2022-07-20 DIAGNOSIS — S161XXD Strain of muscle, fascia and tendon at neck level, subsequent encounter: Secondary | ICD-10-CM | POA: Diagnosis not present

## 2022-07-20 DIAGNOSIS — M542 Cervicalgia: Secondary | ICD-10-CM | POA: Diagnosis not present

## 2022-07-24 DIAGNOSIS — M542 Cervicalgia: Secondary | ICD-10-CM | POA: Diagnosis not present

## 2022-07-24 DIAGNOSIS — S161XXD Strain of muscle, fascia and tendon at neck level, subsequent encounter: Secondary | ICD-10-CM | POA: Diagnosis not present

## 2022-07-27 DIAGNOSIS — M542 Cervicalgia: Secondary | ICD-10-CM | POA: Diagnosis not present

## 2022-07-27 DIAGNOSIS — S161XXD Strain of muscle, fascia and tendon at neck level, subsequent encounter: Secondary | ICD-10-CM | POA: Diagnosis not present

## 2022-08-13 ENCOUNTER — Telehealth: Payer: Self-pay | Admitting: Family Medicine

## 2022-08-13 DIAGNOSIS — G4700 Insomnia, unspecified: Secondary | ICD-10-CM

## 2022-08-13 MED ORDER — TEMAZEPAM 15 MG PO CAPS: ORAL_CAPSULE | ORAL | 3 refills | Status: DC

## 2022-08-13 NOTE — Telephone Encounter (Signed)
Sent. He is due for 6 mo visit w me. Ty.

## 2022-08-13 NOTE — Telephone Encounter (Signed)
Called the patient left a detailed message to call back to schedule an appointment.

## 2022-08-13 NOTE — Telephone Encounter (Signed)
Refill request for Temazepam to [facility] [address] [place] Last OV---01/29/2022 Last RF---11/27/2021--#30 with 3 refills.

## 2022-08-15 DIAGNOSIS — H05249 Constant exophthalmos, unspecified eye: Secondary | ICD-10-CM | POA: Diagnosis not present

## 2022-08-15 DIAGNOSIS — L918 Other hypertrophic disorders of the skin: Secondary | ICD-10-CM | POA: Diagnosis not present

## 2022-08-15 DIAGNOSIS — L298 Other pruritus: Secondary | ICD-10-CM | POA: Diagnosis not present

## 2022-08-15 DIAGNOSIS — R208 Other disturbances of skin sensation: Secondary | ICD-10-CM | POA: Diagnosis not present

## 2022-08-15 DIAGNOSIS — L538 Other specified erythematous conditions: Secondary | ICD-10-CM | POA: Diagnosis not present

## 2022-08-15 DIAGNOSIS — B353 Tinea pedis: Secondary | ICD-10-CM | POA: Diagnosis not present

## 2022-08-21 ENCOUNTER — Encounter: Payer: Self-pay | Admitting: Family Medicine

## 2022-08-21 ENCOUNTER — Ambulatory Visit (INDEPENDENT_AMBULATORY_CARE_PROVIDER_SITE_OTHER): Payer: Medicare Other | Admitting: Family Medicine

## 2022-08-21 VITALS — BP 130/78 | HR 55 | Temp 98.3°F | Ht 64.0 in | Wt 148.2 lb

## 2022-08-21 DIAGNOSIS — G4700 Insomnia, unspecified: Secondary | ICD-10-CM | POA: Diagnosis not present

## 2022-08-21 DIAGNOSIS — E785 Hyperlipidemia, unspecified: Secondary | ICD-10-CM | POA: Diagnosis not present

## 2022-08-21 DIAGNOSIS — I10 Essential (primary) hypertension: Secondary | ICD-10-CM

## 2022-08-21 DIAGNOSIS — R7303 Prediabetes: Secondary | ICD-10-CM

## 2022-08-21 LAB — COMPREHENSIVE METABOLIC PANEL
ALT: 20 U/L (ref 0–53)
AST: 21 U/L (ref 0–37)
Albumin: 4.6 g/dL (ref 3.5–5.2)
Alkaline Phosphatase: 74 U/L (ref 39–117)
BUN: 19 mg/dL (ref 6–23)
CO2: 31 mEq/L (ref 19–32)
Calcium: 9.5 mg/dL (ref 8.4–10.5)
Chloride: 100 mEq/L (ref 96–112)
Creatinine, Ser: 0.89 mg/dL (ref 0.40–1.50)
GFR: 80.07 mL/min (ref >60.00)
Glucose, Bld: 94 mg/dL (ref 70–99)
Potassium: 4.5 mEq/L (ref 3.5–5.1)
Sodium: 137 mEq/L (ref 135–145)
Total Bilirubin: 0.8 mg/dL (ref 0.2–1.2)
Total Protein: 6.6 g/dL (ref 6.0–8.3)

## 2022-08-21 LAB — LDL CHOLESTEROL, DIRECT: Direct LDL: 101 mg/dL

## 2022-08-21 LAB — LIPID PANEL
Cholesterol: 168 mg/dL (ref 0–200)
HDL: 56.7 mg/dL (ref >39.00)
NonHDL: 111.03
Total CHOL/HDL Ratio: 3
Triglycerides: 205 mg/dL — ABNORMAL HIGH (ref 0.0–149.0)
VLDL: 41 mg/dL — ABNORMAL HIGH (ref 0.0–40.0)

## 2022-08-21 LAB — HEMOGLOBIN A1C: Hgb A1c MFr Bld: 5.5 % (ref 4.6–6.5)

## 2022-08-21 NOTE — Patient Instructions (Signed)
Give [place] 2-3 business days to get the results of your labs back.   Keep the diet clean and stay active.  Sleep is important to [place] all. Getting good sleep is imperative to adequate functioning during the day. Work with our counselors who are trained to help people obtain quality sleep. Call [phone] to schedule an appointment or if you are curious about insurance coverage/cost.  Let us know if you need anything.

## 2022-08-21 NOTE — Progress Notes (Addendum)
Chief Complaint  Patient presents with   Follow-up    Subjective Ronnie Payne is a 82 y.o. male who presents for hypertension follow up. He does not monitor home blood pressures. He is compliant with medications- atenolol 50 mg/d, losartan 25 mg/d. Patient has these side effects of medication: none He is adhering to a healthy diet overall. Current exercise: walking No CP or SOB.   Hyperlipidemia Patient presents for dyslipidemia follow up. Currently being treated with Lipitor 20 mg/d and compliance with treatment thus far has been good. He denies myalgias. Diet/exercise as above.  The patient is not known to have coexisting coronary artery disease.   Past Medical History:  Diagnosis Date   Arthritis    Cold sore    Frequent headaches    History of skin cancer    Hyperlipidemia    Hypertension    Prediabetes     Exam BP 130/78 (BP Location: Left Arm, Patient Position: Sitting, Cuff Size: Normal)   Pulse (!) 55   Temp 98.3 F (36.8 C) (Oral)   Ht  (1.626 m)   Wt 148 lb 4 oz (67.2 kg)   SpO2 97%   BMI 25.45 kg/m  General:  well developed, well nourished, in no apparent distress Heart: Reg rhythm, bradycardic, no bruits, no LE edema Lungs: clear to auscultation, no accessory muscle use Psych: well oriented with normal range of affect and appropriate judgment/insight  Essential hypertension  Hyperlipidemia, unspecified hyperlipidemia type - Plan: Comprehensive metabolic panel, Lipid panel  Prediabetes - Plan: Hemoglobin A1c  Insomnia, unspecified type  Chronic, stable.  Continue atenolol 50 mg daily, losartan 25 mg daily.  Counseled on diet and exercise. Chronic, stable.  Continue Lipitor 20 mg daily. Monitor above. And recommendation provided for CBT.  Offered medication which he politely declined at this time.  Continue supplementation. F/u in 6 mo. The patient voiced understanding and agreement to the plan.  Jilda Roche [place], DO 08/21/22   9:33 AM

## 2022-08-22 ENCOUNTER — Other Ambulatory Visit: Payer: Self-pay | Admitting: Family Medicine

## 2022-08-22 DIAGNOSIS — E785 Hyperlipidemia, unspecified: Secondary | ICD-10-CM

## 2022-09-17 ENCOUNTER — Encounter: Payer: Self-pay | Admitting: Family Medicine

## 2022-09-18 DIAGNOSIS — L538 Other specified erythematous conditions: Secondary | ICD-10-CM | POA: Diagnosis not present

## 2022-09-18 DIAGNOSIS — L570 Actinic keratosis: Secondary | ICD-10-CM | POA: Diagnosis not present

## 2022-09-18 DIAGNOSIS — L298 Other pruritus: Secondary | ICD-10-CM | POA: Diagnosis not present

## 2022-09-18 DIAGNOSIS — L820 Inflamed seborrheic keratosis: Secondary | ICD-10-CM | POA: Diagnosis not present

## 2022-09-18 DIAGNOSIS — R208 Other disturbances of skin sensation: Secondary | ICD-10-CM | POA: Diagnosis not present

## 2022-09-18 DIAGNOSIS — L111 Transient acantholytic dermatosis [Grover]: Secondary | ICD-10-CM | POA: Diagnosis not present

## 2022-09-18 DIAGNOSIS — B353 Tinea pedis: Secondary | ICD-10-CM | POA: Diagnosis not present

## 2022-10-02 ENCOUNTER — Other Ambulatory Visit (INDEPENDENT_AMBULATORY_CARE_PROVIDER_SITE_OTHER): Payer: Medicare Other

## 2022-10-02 DIAGNOSIS — E785 Hyperlipidemia, unspecified: Secondary | ICD-10-CM | POA: Diagnosis not present

## 2022-10-02 LAB — LIPID PANEL
Cholesterol: 155 mg/dL (ref 0–200)
HDL: 51 mg/dL (ref >39.00)
LDL Cholesterol: 86 mg/dL (ref 0–99)
NonHDL: 104.36
Total CHOL/HDL Ratio: 3
Triglycerides: 94 mg/dL (ref 0.0–149.0)
VLDL: 18.8 mg/dL (ref 0.0–40.0)

## 2022-11-01 DIAGNOSIS — H6121 Impacted cerumen, right ear: Secondary | ICD-10-CM | POA: Diagnosis not present

## 2022-11-01 DIAGNOSIS — H903 Sensorineural hearing loss, bilateral: Secondary | ICD-10-CM | POA: Diagnosis not present

## 2022-11-07 ENCOUNTER — Ambulatory Visit (INDEPENDENT_AMBULATORY_CARE_PROVIDER_SITE_OTHER): Payer: Medicare Other | Admitting: Psychology

## 2022-11-07 DIAGNOSIS — F5104 Psychophysiologic insomnia: Secondary | ICD-10-CM

## 2022-11-07 DIAGNOSIS — F4322 Adjustment disorder with anxiety: Secondary | ICD-10-CM

## 2022-11-07 NOTE — Progress Notes (Signed)
[facility] Counselor Initial Adult Exam  Name: Ronnie Payne Date: 11/07/2022 MRN: [id number] DOB: [birth year] PCP: Ronnie Dory, DO  Time spent: 3:00pm-3:55pm  55 minutes  Guardian/Payee:  n/a    Paperwork requested: No   Reason for Visit /Presenting Problem: Pt present for face-to-face initial assessment in person.  Pt was referred to therapy by PCP Dr. Carmelia Payne.  Pt has been having trouble with sleep for a few years.   Pt takes Temazepam (Restoril) to sleep but his PCP does not want him to rely on this.  Pt also takes 3 mg melatonin to get to sleep. Pt's primary sleep problems are middle of the night awakenings where he has trouble getting back to sleep.  At times pt worries about his sleep and has trouble quieting his mind.    Mental Status Exam: Appearance:   Casual     Behavior:  Appropriate  Motor:  Normal  Speech/Language:   Normal Rate  Affect:  Appropriate  Mood:  normal  Thought process:  normal  Thought content:    WNL  Sensory/Perceptual disturbances:    WNL  Orientation:  oriented to person, place, time/date, and situation  Attention:  Good  Concentration:  Good  Memory:  WNL  Fund of knowledge:   Good  Insight:    Good  Judgment:   Good  Impulse Control:  Good    Reported Symptoms:  worrying, difficulty sleeping  Risk Assessment: Danger to Self:  No Self-injurious Behavior: No Danger to Others: No Duty to Warn:no Physical Aggression / Violence:No  Access to Firearms a concern: No  Gang Involvement:No  Patient / guardian was educated about steps to take if suicide or homicide risk level increases between visits: n/a While future psychiatric events cannot be accurately predicted, the patient does not currently require acute inpatient psychiatric care and does not currently meet [facility] involuntary commitment criteria.  Substance Abuse History: Current substance abuse: No     Past Psychiatric History:   No previous  psychological problems have been observed Outpatient Providers:this is pt's first time in therapy. History of Psych Hospitalization: No  Psychological Testing:  n/a    Abuse History:  Victim of: No.,  n/a    Report needed: No. Victim of Neglect:No. Perpetrator of  n/a   Witness / Exposure to Domestic Violence: No   Protective Services Involvement: No  Witness to [employer] Violence:  No   Family History:  Family History  Problem Relation Age of Onset   Thyroid cancer Mother    Emphysema Father    Lung cancer Sister    Colon cancer Neg Hx    Colon polyps Neg Hx    Esophageal cancer Neg Hx    Stomach cancer Neg Hx    Rectal cancer Neg Hx     Living situation: the patient lives with his wife.   They have been married for 44 years.    Pt is from [place].  His wife is from [place].  Pt grew up with both parents and 4 brothers and two sisters.   Pt states he had a good childhood.  Family history of mental health issues:  none  No childhood abuse.     Sexual Orientation: Straight  Relationship Status: married  Name of spouse / other:Ronnie Payne If a parent, number of children / ages:one daughter 77 years old.   Pt's daughter is a Aeronautical engineer.    Support Systems: spouse and himself.   Pt states his primary support  is himself.   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    Military Service: No   Educational History: Education: post Engineer, maintenance (IT) work or degree  Religion/Sprituality/World View: Catholic.      Any cultural differences that may affect / interfere with treatment:  not applicable   Recreation/Hobbies: pt enjoys sports.  Pt likes tennis and he use to ski.  Pt enjoys going to the gym and plays racketball and weight training. Pt likes to listen to music.  He likes to work on the computer.    Stressors: Other: difficulties sleeping.    Strengths: Family, Spirituality, Hopefulness, Self Advocate, and Able  to Communicate Effectively  Barriers:  none   Legal History: Pending legal issue / charges: The patient has no significant history of legal issues. 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 90 tablet 2   MYRBETRIQ 50 MG TB24 tablet Take 50 mg by mouth daily at 12 noon.     temazepam (RESTORIL) 15 MG capsule TAKE 1 CAPSULE BY MOUTH EVERY NIGHT AT BEDTIME FOR SLEEP. 30 capsule 3   No current facility-administered medications for this visit.    Allergies  Allergen Reactions   Doxycycline     Unknown reaction   Zanaflex [Tizanidine] Other (See Comments)   Tape Rash    Paper tape ok    Diagnoses:  F43.22   Plan of Care: Recommend ongoing therapy.  Pt participated in setting treatment goals and agrees with treatment plan.   Pt's goals are to increase sleep and improve coping skills.  Plan to meet every two weeks.    Treatment Plan Client Abilities/Strengths  Pt is bright, engaging and motivated for therapy.  Client Treatment Preferences  Individual therapy.  Client Statement of Needs  Improve  coping skills.  Symptoms  Worrying, difficulty sleeping. Problems Addressed  Anxiety Goals 1. Enhance ability to effectively cope with the full variety of life's worries and anxieties. 2. Learn and implement coping skills that result in a reduction of anxiety and worry, and improved daily functioning. Objective Learn to accept limitations in life and commit to tolerating, rather than avoiding, unpleasant emotions while accomplishing meaningful goals. Target Date: 2023-11-07 Frequency: Biweekly Progress: 10 Modality: individual Related Interventions 1. Use techniques from Acceptance and Commitment Therapy to help client accept uncomfortable realities such as lack of complete control, imperfections, and uncertainty and tolerate unpleasant emotions and thoughts in order to accomplish value-consistent goals. Objective Learn and implement problem-solving strategies for realistically  addressing worries. Target Date: 2023-11-07 Frequency: Biweekly Progress: 10 Modality: individual Related Interventions 1. Assign the client a homework exercise in which he/she problem-solves a current problem.  review, reinforce success, and provide corrective feedback toward improvement. 2. Teach the client problem-solving strategies involving specifically defining a problem, generating options for addressing it, evaluating the pros and cons of each option, selecting and implementing an optional action, and reevaluating and refining the action. Objective Learn and implement calming skills to reduce overall anxiety and manage anxiety symptoms. Target Date: 2023-11-07 Frequency: Biweekly Progress: 10 Modality: individual Related Interventions 1. Assign the client to read about progressive muscle relaxation and other calming strategies in relevant books or treatment manuals (e.g., Progressive Relaxation Training by Robb Matar and Alen Blew; Mastery of Your Anxiety and Worry: Workbook by Earlie Counts). 2. Assign  the client homework each session in which he/she practices relaxation exercises daily, gradually applying them progressively from non-anxiety-provoking to anxiety-provoking situations; review and reinforce success while providing corrective feedback toward improvement. 3. Teach the client calming/relaxation skills (e.g., applied relaxation, progressive muscle relaxation, cue controlled relaxation; mindful breathing; biofeedback) and how to discriminate better between relaxation and tension; teach the client how to apply these skills to his/her daily life. 3. Reduce overall frequency, intensity, and duration of the anxiety so that daily functioning is not impaired. 4. Resolve the core conflict that is the source of anxiety. 5. Stabilize anxiety level while increasing ability to function on a daily basis. 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  Treatment Plan  Symptoms  Complains of difficulty falling asleep.  Complains of difficulty remaining asleep. Problems Addressed  Sleep Disturbance, Sleep Disturbance  Goals 1. Feel refreshed and energetic during wakeful hours. 2. Restore restful sleep pattern. Objective Learn and implement stimulus control strategies to establish a consistent sleep-wake rhythm. Target Date: 2023-11-07 Frequency: Biweekly Progress: 10 Modality: individual Related Interventions 1. Discuss with the client the rationale for stimulus control strategies to establish a consistent sleep-wake cycle (see Behavioral Treatments for Sleep Disorders by Clarisa Kindred, and Kem Kays). 2. Teach the client stimulus control techniques (e.g., lie down to sleep only when sleepy; do not use the bed for activities like watching television, reading, listening to music, but only for sleep or sexual activity; get out of bed if sleep doesn't arrive soon after retiring; lie back down when sleepy; set alarm to the same  wake-up time every morning regardless of sleep time or quality; do not nap during the day); assign consistent implementation. 3. Instruct the client to move activities associated with arousal and activation from the bedtime ritual to other times during the day (e.g., reading stimulating content, reviewing day's events, planning for next day, watching disturbing television). 4. Monitor the client's sleep patterns and compliance with stimulus control instructions; problem-solve obstacles and reinforce successful, consistent implementation. Objective Learn and implement a sleep restriction method to increase sleep efficiency. Target Date: 2023-11-07 Frequency: Biweekly Progress: 10 Modality: individual Related Interventions 5. Use a sleep restriction therapy approach in which the amount of time in bed is reduced to match the amount of time the patient typically sleeps (e.g., from 8 hours to 5), thus inducing systematic sleep deprivation; periodically adjust sleep time upward until an optimal sleep duration is reached. Objective Learn and implement calming skills for use at bedtime. Target Date: 2023-11-07 Frequency: Biweekly Progress: 10 Modality: individual Related Interventions 6. Teach the client relaxation skills (e.g., progressive muscle relaxation, guided imagery,  slow diaphragmatic breathing); teach the client how to apply these skills to facilitate relaxation and sleep at bedtime (see "Bedtime Relaxation Techniques" by Cherie Dark). 7. Refer the client for or conduct biofeedback training to strengthen the client's successful relaxation response. Objective Practice good sleep hygiene. Target Date: 2023-11-07 Frequency: Biweekly Progress: 10 Modality: individual Related Interventions 8. Instruct the client in sleep hygiene practices such as restricting excessive liquid intake, spicy late night snacks, or heavy evening meals; exercising regularly, but not within 3 - 4 hours of bedtime;  minimizing or avoiding caffeine, alcohol, tobacco, and stimulant intake (or assign "Sleep Pattern Record" in the Adult Psychotherapy Homework Planner by [facility]). Diagnosis :  Primary Insomnia  Conditions For Discharge Achievement of treatment goals and objectives    Salomon Fick, LCSW

## 2022-11-21 DIAGNOSIS — D229 Melanocytic nevi, unspecified: Secondary | ICD-10-CM | POA: Diagnosis not present

## 2022-11-21 DIAGNOSIS — D485 Neoplasm of uncertain behavior of skin: Secondary | ICD-10-CM | POA: Diagnosis not present

## 2022-11-21 DIAGNOSIS — Z85828 Personal history of other malignant neoplasm of skin: Secondary | ICD-10-CM | POA: Diagnosis not present

## 2022-11-21 DIAGNOSIS — Z7189 Other specified counseling: Secondary | ICD-10-CM | POA: Diagnosis not present

## 2022-11-21 DIAGNOSIS — L814 Other melanin hyperpigmentation: Secondary | ICD-10-CM | POA: Diagnosis not present

## 2022-11-21 DIAGNOSIS — Z86007 Personal history of in-situ neoplasm of skin: Secondary | ICD-10-CM | POA: Diagnosis not present

## 2022-11-21 DIAGNOSIS — Z872 Personal history of diseases of the skin and subcutaneous tissue: Secondary | ICD-10-CM | POA: Diagnosis not present

## 2022-11-21 DIAGNOSIS — L821 Other seborrheic keratosis: Secondary | ICD-10-CM | POA: Diagnosis not present

## 2022-11-21 DIAGNOSIS — H61031 Chondritis of right external ear: Secondary | ICD-10-CM | POA: Diagnosis not present

## 2022-11-21 DIAGNOSIS — Z85820 Personal history of malignant melanoma of skin: Secondary | ICD-10-CM | POA: Diagnosis not present

## 2022-11-21 DIAGNOSIS — Z08 Encounter for follow-up examination after completed treatment for malignant neoplasm: Secondary | ICD-10-CM | POA: Diagnosis not present

## 2022-11-28 ENCOUNTER — Ambulatory Visit (INDEPENDENT_AMBULATORY_CARE_PROVIDER_SITE_OTHER): Payer: Medicare Other | Admitting: Psychology

## 2022-11-28 DIAGNOSIS — F5104 Psychophysiologic insomnia: Secondary | ICD-10-CM

## 2022-11-28 DIAGNOSIS — F4322 Adjustment disorder with anxiety: Secondary | ICD-10-CM | POA: Diagnosis not present

## 2022-11-28 NOTE — Progress Notes (Signed)
[facility] Counselor/Therapist Progress Note  Patient ID: GLADSTONE ROSAS, MRN: [id number],    Date: 11/28/2022  Time Spent: 3:00pm-3:55pm    55 minutes   Treatment Type: Individual Therapy  Reported Symptoms: stress  Mental Status Exam: Appearance:  Casual     Behavior: Appropriate  Motor: Normal  Speech/Language:  Normal Rate  Affect: Appropriate  Mood: normal  Thought process: normal  Thought content:   WNL  Sensory/Perceptual disturbances:   WNL  Orientation: oriented to person, place, time/date, and situation  Attention: Good  Concentration: Good  Memory: WNL  Fund of knowledge:  Good  Insight:   Good  Judgment:  Good  Impulse Control: Good   Risk Assessment: Danger to Self:  No Self-injurious Behavior: No Danger to Others: No Duty to Warn:no Physical Aggression / Violence:No  Access to Firearms a concern: No  Gang Involvement:No   Subjective: Pt present for face-to-face individual therapy in person.   He completed his sleep diaries.   Reviewed the 3 weeks of data and calculated Average Time in Bed (TIB) to be 7.35 hours and Average Sleep Time (ST) to be 6.26 hours.   Educated pt about sleep science and sleep strategies.   Gave pt sleep prescription to have total time in bed be 6 hours.   Determined going to bed time to be 12:00am and rise time to be 6:00am.   Gave pt homework to continue to complete the sleep diaries.    Plan to meet in two weeks.    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  Pt participated in setting treatment goals and agrees with treatment plan.   Pt's goals are to increase sleep and improve coping skills.  Plan to meet every two weeks.    Treatment Plan Client Abilities/Strengths  Pt is bright, engaging and motivated for therapy.  Client Treatment Preferences  Individual therapy.  Client Statement of Needs  Improve coping skills.   Symptoms  Worrying, difficulty sleeping. Problems Addressed  Anxiety Goals 1. Enhance ability to effectively cope with the full variety of life's worries and anxieties. 2. Learn and implement coping skills that result in a reduction of anxiety and worry, and improved daily functioning. Objective Learn to accept limitations in life and commit to tolerating, rather than avoiding, unpleasant emotions while accomplishing meaningful goals. Target Date: 2023-11-07 Frequency: Biweekly Progress: 10 Modality: individual Related Interventions 1. Use techniques from Acceptance and Commitment Therapy to help client accept uncomfortable realities such as lack of complete control, imperfections, and uncertainty and tolerate unpleasant emotions and thoughts in order to accomplish value-consistent goals. Objective Learn and implement problem-solving strategies for realistically addressing worries. Target Date: 2023-11-07 Frequency: Biweekly Progress: 10 Modality: individual Related Interventions 1. Assign the client a homework exercise in which he/she problem-solves a current problem.  review, reinforce success, and provide corrective feedback toward improvement. 2. Teach the client problem-solving strategies involving specifically defining a problem, generating options for addressing it, evaluating the pros and cons of each option, selecting and implementing an optional action, and reevaluating and refining the action. Objective Learn and implement calming skills to reduce overall anxiety and manage anxiety symptoms. Target Date: 2023-11-07 Frequency: Biweekly Progress: 10 Modality: individual Related Interventions 1. Assign the client to read about progressive muscle relaxation and other calming strategies in relevant books or treatment manuals (e.g., Progressive Relaxation Training by Robb Matar and Alen Blew; Mastery of Your Anxiety and Worry: Workbook by Earlie Counts).  2. Assign the client  homework each session in which he/she practices relaxation exercises daily, gradually applying them progressively from non-anxiety-provoking to anxiety-provoking situations; review and reinforce success while providing corrective feedback toward improvement. 3. Teach the client calming/relaxation skills (e.g., applied relaxation, progressive muscle relaxation, cue controlled relaxation; mindful breathing; biofeedback) and how to discriminate better between relaxation and tension; teach the client how to apply these skills to his/her daily life. 3. Reduce overall frequency, intensity, and duration of the anxiety so that daily functioning is not impaired. 4. Resolve the core conflict that is the source of anxiety. 5. Stabilize anxiety level while increasing ability to function on a daily basis. 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  Treatment Plan  Symptoms  Complains of difficulty falling asleep.  Complains of difficulty remaining asleep. Problems Addressed  Sleep Disturbance, Sleep Disturbance  Goals 1. Feel refreshed and energetic during wakeful hours. 2. Restore restful sleep pattern. Objective Learn and implement stimulus control strategies to establish a consistent sleep-wake rhythm. Target Date: 2023-11-07 Frequency: Biweekly Progress: 10 Modality: individual Related Interventions 1. Discuss with the client the rationale for stimulus control strategies to establish a consistent sleep-wake cycle (see Behavioral Treatments for Sleep Disorders by Clarisa Kindred, and Kem Kays). 2. Teach the client stimulus control techniques (e.g., lie down to sleep only when sleepy; do not use the bed for activities like watching television, reading, listening to music, but only for sleep or sexual activity; get out of bed if sleep doesn't arrive soon after retiring; lie back down when sleepy; set alarm to the same wake-up  time every morning regardless of sleep time or quality; do not nap during the day); assign consistent implementation. 3. Instruct the client to move activities associated with arousal and activation from the bedtime ritual to other times during the day (e.g., reading stimulating content, reviewing day's events, planning for next day, watching disturbing television). 4. Monitor the client's sleep patterns and compliance with stimulus control instructions; problem-solve obstacles and reinforce successful, consistent implementation. Objective Learn and implement a sleep restriction method to increase sleep efficiency. Target Date: 2023-11-07 Frequency: Biweekly Progress: 10 Modality: individual Related Interventions 5. Use a sleep restriction therapy approach in which the amount of time in bed is reduced to match the amount of time the patient typically sleeps (e.g., from 8 hours to 5), thus inducing systematic sleep deprivation; periodically adjust sleep time upward until an optimal sleep duration is reached. Objective Learn and implement calming skills for use at bedtime. Target Date: 2023-11-07 Frequency: Biweekly Progress: 10 Modality: individual Related Interventions 6. Teach the client relaxation skills (e.g., progressive muscle relaxation, guided imagery, slow diaphragmatic breathing); teach the client how to apply these skills to facilitate relaxation and sleep at bedtime (see "Bedtime Relaxation Techniques" by Cherie Dark). 7. Refer the client for or conduct biofeedback training to strengthen the client's successful relaxation response. Objective Practice good sleep hygiene. Target Date: 2023-11-07 Frequency: Biweekly Progress: 10 Modality: individual Related Interventions 8. Instruct the client in sleep hygiene practices such as restricting excessive liquid intake, spicy late night snacks, or heavy evening meals; exercising regularly, but not within 3 - 4 hours of bedtime; minimizing  or avoiding caffeine, alcohol, tobacco, and stimulant intake (or assign "Sleep Pattern Record" in the Adult Psychotherapy Homework Planner by [facility]). Diagnosis :  Primary Insomnia  Conditions For Discharge Achievement of treatment goals and objectives  Salomon Fick, LCSW

## 2022-11-30 DIAGNOSIS — R109 Unspecified abdominal pain: Secondary | ICD-10-CM | POA: Diagnosis not present

## 2022-11-30 DIAGNOSIS — R338 Other retention of urine: Secondary | ICD-10-CM | POA: Diagnosis not present

## 2022-11-30 DIAGNOSIS — R1084 Generalized abdominal pain: Secondary | ICD-10-CM | POA: Diagnosis not present

## 2022-12-03 DIAGNOSIS — R351 Nocturia: Secondary | ICD-10-CM | POA: Diagnosis not present

## 2022-12-03 DIAGNOSIS — R350 Frequency of micturition: Secondary | ICD-10-CM | POA: Diagnosis not present

## 2022-12-03 DIAGNOSIS — N401 Enlarged prostate with lower urinary tract symptoms: Secondary | ICD-10-CM | POA: Diagnosis not present

## 2022-12-03 DIAGNOSIS — R3915 Urgency of urination: Secondary | ICD-10-CM | POA: Diagnosis not present

## 2022-12-06 ENCOUNTER — Encounter: Payer: Self-pay | Admitting: Family Medicine

## 2022-12-06 ENCOUNTER — Ambulatory Visit (INDEPENDENT_AMBULATORY_CARE_PROVIDER_SITE_OTHER): Payer: Medicare Other | Admitting: Family Medicine

## 2022-12-06 VITALS — BP 138/64 | HR 81 | Temp 98.1°F | Resp 18 | Ht 64.0 in | Wt 151.0 lb

## 2022-12-06 DIAGNOSIS — R300 Dysuria: Secondary | ICD-10-CM

## 2022-12-06 LAB — POCT URINALYSIS DIPSTICK
Glucose, UA: POSITIVE — AB
Ketones, UA: NEGATIVE
Nitrite, UA: POSITIVE
Protein, UA: POSITIVE — AB
Spec Grav, UA: 1.01 (ref 1.010–1.025)
Urobilinogen, UA: 0.2 E.U./dL
pH, UA: 6 (ref 5.0–8.0)

## 2022-12-06 LAB — URINALYSIS, MICROSCOPIC ONLY

## 2022-12-06 MED ORDER — SULFAMETHOXAZOLE-TRIMETHOPRIM 800-160 MG PO TABS
1.0000 | ORAL_TABLET | Freq: Two times a day (BID) | ORAL | 0 refills | Status: DC
Start: 2022-12-06 — End: 2022-12-14

## 2022-12-06 NOTE — Progress Notes (Signed)
Acute Office Visit  Subjective:     Patient ID: Ronnie Payne, male    DOB: 1940-06-18, 82 y.o.   MRN: [id number]  Chief Complaint  Patient presents with   possible UTI    Pt says he had a catheter placed on Friday by [facility] d/t urine retention x 1 week. He is wondering if he has a UTI or if it was placed wrong because last night he started running a temp of 102, had bad joint pain and was sweating badly. He took 2 Tylenol at around 3am this morning. And has tried Azo per urology advise.     HPI Patient is in today for urinary concerns.   Discussed the use of AI scribe software for clinical note transcription with the patient, who gave verbal consent to proceed.  History of Present Illness   The patient, with a history of urological issues, presents with a two-week history of bladder pain. The pain was initially intermittent and variable in intensity, but it did not resolve spontaneously as the patient had hoped. The patient sought medical attention from their urologist, who performed several tests, including a bladder scan revealing urinary retention, and a catheter was placed as a temporary solution pending further investigation. (Records not yet available in EMR).  However, the catheter has been causing significant discomfort. The patient reports frequent urges to urinate, approximately every 15-20 minutes, with urine passing through the urethra despite the catheter. This has led to a burning sensation, which intensifies during urination and is present even before the urge to urinate.   The patient's condition worsened with the onset of fever, reaching 102 degrees Fahrenheit, accompanied by night sweats and chills. The patient also noticed blood in the urine collected in the catheter bag. Despite taking over-the-counter medication (Azo) as advised by their urologist, the patient's symptoms persisted and their condition did not improve. He tried to see them today but they did  not have any availability. He has an appointment with them next Friday.          ROS All review of systems negative except what is listed in the HPI      Objective:    BP 138/64 (BP Location: Left Arm, Patient Position: Sitting, Cuff Size: Normal)   Pulse 81   Temp 98.1 F (36.7 C) (Oral)   Resp 18   Ht 5\' 4"  (1.626 m)   Wt 151 lb (68.5 kg)   SpO2 98%   BMI 25.92 kg/m    Physical Exam Vitals reviewed. Exam conducted with a chaperone present.  Abdominal:     Tenderness: There is no right CVA tenderness or left CVA tenderness.  Genitourinary:    Penis: Normal.      Testes: Normal.  Skin:    General: Skin is warm and dry.  Neurological:     Mental Status: He is oriented to person, place, and time.  Psychiatric:        Mood and Affect: Mood normal.        Behavior: Behavior normal.        Thought Content: Thought content normal.        Judgment: Judgment normal.     Results for orders placed or performed in visit on 12/06/22  POCT Urinalysis Dipstick  Result Value Ref Range   Color, UA red    Clarity, UA clody    Glucose, UA Positive (A) Negative   Bilirubin, UA moderate    Ketones, UA negative  Spec Grav, UA 1.010 1.010 - 1.025   Blood, UA large    pH, UA 6.0 5.0 - 8.0   Protein, UA Positive (A) Negative   Urobilinogen, UA 0.2 0.2 or 1.0 E.U./dL   Nitrite, UA positive    Leukocytes, UA Large (3+) (A) Negative   Appearance red    Odor yes         Assessment & Plan:   Problem List Items Addressed This Visit   None Visit Diagnoses     Dysuria    -  Primary   Relevant Medications   sulfamethoxazole-trimethoprim (BACTRIM DS) 800-160 MG tablet   Other Relevant Orders   Urine Culture   POCT Urinalysis Dipstick (Completed)   Urine Microscopic Only     UA concerning for UTI. Starting Bactrim and sending for culture.  Called urology to update them and get additional guidance regarding current catheter and follow-up. The nurse will relay message  to provider and call me back. Patient stable - will give him an update when they call back. Patient aware of signs/symptoms requiring further/urgent evaluation.      Meds ordered this encounter  Medications   sulfamethoxazole-trimethoprim (BACTRIM DS) 800-160 MG tablet    Sig: Take 1 tablet by mouth 2 (two) times daily for 7 days.    Dispense:  14 tablet    Refill:  0    Order Specific Question:   Supervising Provider    Answer:   Danise Edge A [[id number]]    Return if symptoms worsen or fail to improve.  Clayborne Dana, NP

## 2022-12-07 ENCOUNTER — Telehealth: Payer: Self-pay | Admitting: Family Medicine

## 2022-12-07 NOTE — Telephone Encounter (Signed)
I attempted to call patient's urologist yesterday and today and nurse triage said a provider would be calling me back. No word yet. Wanted to inform them of new UTI since catheter being placed last Friday and get their input on catheter remaining and follow-up in their office. We already started antibiotics and can adjust when culture results. Patient is aware of signs/symptoms requiring further/urgent evaluation. He has a routine follow-up with urologist next Friday. If they call me back with any further recommendations, we will update patient. Otherwise, continue antibiotics, hydration, and monitor symptoms.

## 2022-12-10 ENCOUNTER — Other Ambulatory Visit: Payer: Self-pay | Admitting: Family Medicine

## 2022-12-10 ENCOUNTER — Encounter: Payer: Self-pay | Admitting: Family Medicine

## 2022-12-10 DIAGNOSIS — B001 Herpesviral vesicular dermatitis: Secondary | ICD-10-CM

## 2022-12-10 NOTE — Telephone Encounter (Signed)
Dr Carmelia Roller, Pt was seen by Ladona Ridgel d/t your schedule being full (after calling [facility])  Ladona Ridgel tried to contact [facility] multiple times to no avail.  Should he continue Bactrim?

## 2022-12-11 ENCOUNTER — Other Ambulatory Visit: Payer: Self-pay | Admitting: Family Medicine

## 2022-12-11 DIAGNOSIS — B001 Herpesviral vesicular dermatitis: Secondary | ICD-10-CM

## 2022-12-11 NOTE — Telephone Encounter (Signed)
Off current list, ok to refill

## 2022-12-12 ENCOUNTER — Ambulatory Visit: Payer: Medicare Other | Admitting: Psychology

## 2022-12-12 DIAGNOSIS — F5104 Psychophysiologic insomnia: Secondary | ICD-10-CM | POA: Diagnosis not present

## 2022-12-12 DIAGNOSIS — F4322 Adjustment disorder with anxiety: Secondary | ICD-10-CM | POA: Diagnosis not present

## 2022-12-12 NOTE — Progress Notes (Signed)
[facility] Counselor/Therapist Progress Note  Patient ID: EMERALD CUNNANE, MRN: [id number],    Date: 12/12/2022  Time Spent: 2:00pm-2:55pm    55 minutes   Treatment Type: Individual Therapy  Reported Symptoms: stress  Mental Status Exam: Appearance:  Casual     Behavior: Appropriate  Motor: Normal  Speech/Language:  Normal Rate  Affect: Appropriate  Mood: normal  Thought process: normal  Thought content:   WNL  Sensory/Perceptual disturbances:   WNL  Orientation: oriented to person, place, time/date, and situation  Attention: Good  Concentration: Good  Memory: WNL  Fund of knowledge:  Good  Insight:   Good  Judgment:  Good  Impulse Control: Good   Risk Assessment: Danger to Self:  No Self-injurious Behavior: No Danger to Others: No Duty to Warn:no Physical Aggression / Violence:No  Access to Firearms a concern: No  Gang Involvement:No   Subjective: Pt present for face-to-face individual therapy in person.   He completed his sleep diaries.   Reviewed the 2 weeks of data and calculated Average Time in Bed (TIB) to be  6.2 hours and Average Sleep Time (ST) to be 6 hours.   Pt did a good job adhering to the sleep prescription.  He did not nap during the day.  Subsequently his middle of the night awakenings were eliminated which is great progress for him.   Gave pt sleep prescription to have total time in bed be 6.5 hours.   Determined going to bed time to be 11:30pm and rise time to be 6:00am.   Gave pt homework to continue to complete the sleep diaries.    Plan to meet in 2 weeks.    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  Pt participated in setting treatment goals and agrees with treatment plan.   Pt's goals are to increase sleep and improve coping skills.  Plan to meet every two weeks.    Treatment Plan Client Abilities/Strengths  Pt is bright, engaging and motivated  for therapy.  Client Treatment Preferences  Individual therapy.  Client Statement of Needs  Improve coping skills.  Symptoms  Worrying, difficulty sleeping. Problems Addressed  Anxiety Goals 1. Enhance ability to effectively cope with the full variety of life's worries and anxieties. 2. Learn and implement coping skills that result in a reduction of anxiety and worry, and improved daily functioning. Objective Learn to accept limitations in life and commit to tolerating, rather than avoiding, unpleasant emotions while accomplishing meaningful goals. Target Date: 2023-11-07 Frequency: Biweekly Progress: 10 Modality: individual Related Interventions 1. Use techniques from Acceptance and Commitment Therapy to help client accept uncomfortable realities such as lack of complete control, imperfections, and uncertainty and tolerate unpleasant emotions and thoughts in order to accomplish value-consistent goals. Objective Learn and implement problem-solving strategies for realistically addressing worries. Target Date: 2023-11-07 Frequency: Biweekly Progress: 10 Modality: individual Related Interventions 1. Assign the client a homework exercise in which he/she problem-solves a current problem.  review, reinforce success, and provide corrective feedback toward improvement. 2. Teach the client problem-solving strategies involving specifically defining a problem, generating options for addressing it, evaluating the pros and cons of each option, selecting and implementing an optional action, and reevaluating and refining the action. Objective Learn and implement calming skills to reduce overall anxiety and manage anxiety symptoms. Target Date: 2023-11-07 Frequency: Biweekly Progress: 10 Modality: individual Related Interventions 1. Assign the client to read about progressive muscle relaxation and other  calming strategies in relevant books or treatment manuals (e.g., Progressive Relaxation Training by  Twana First; Mastery of Your Anxiety and Worry: Workbook by Earlie Counts). 2. Assign the client homework each session in which he/she practices relaxation exercises daily, gradually applying them progressively from non-anxiety-provoking to anxiety-provoking situations; review and reinforce success while providing corrective feedback toward improvement. 3. Teach the client calming/relaxation skills (e.g., applied relaxation, progressive muscle relaxation, cue controlled relaxation; mindful breathing; biofeedback) and how to discriminate better between relaxation and tension; teach the client how to apply these skills to his/her daily life. 3. Reduce overall frequency, intensity, and duration of the anxiety so that daily functioning is not impaired. 4. Resolve the core conflict that is the source of anxiety. 5. Stabilize anxiety level while increasing ability to function on a daily basis. 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  Treatment Plan  Symptoms  Complains of difficulty falling asleep.  Complains of difficulty remaining asleep. Problems Addressed  Sleep Disturbance, Sleep Disturbance  Goals 1. Feel refreshed and energetic during wakeful hours. 2. Restore restful sleep pattern. Objective Learn and implement stimulus control strategies to establish a consistent sleep-wake rhythm. Target Date: 2023-11-07 Frequency: Biweekly Progress: 10 Modality: individual Related Interventions 1. Discuss with the client the rationale for stimulus control strategies to establish a consistent sleep-wake cycle (see Behavioral Treatments for Sleep Disorders by Clarisa Kindred, and Kem Kays). 2. Teach the client stimulus control techniques (e.g., lie down to sleep only when sleepy; do not use the bed for activities like watching television, reading, listening to music, but only for sleep or sexual activity; get  out of bed if sleep doesn't arrive soon after retiring; lie back down when sleepy; set alarm to the same wake-up time every morning regardless of sleep time or quality; do not nap during the day); assign consistent implementation. 3. Instruct the client to move activities associated with arousal and activation from the bedtime ritual to other times during the day (e.g., reading stimulating content, reviewing day's events, planning for next day, watching disturbing television). 4. Monitor the client's sleep patterns and compliance with stimulus control instructions; problem-solve obstacles and reinforce successful, consistent implementation. Objective Learn and implement a sleep restriction method to increase sleep efficiency. Target Date: 2023-11-07 Frequency: Biweekly Progress: 10 Modality: individual Related Interventions 5. Use a sleep restriction therapy approach in which the amount of time in bed is reduced to match the amount of time the patient typically sleeps (e.g., from 8 hours to 5), thus inducing systematic sleep deprivation; periodically adjust sleep time upward until an optimal sleep duration is reached. Objective Learn and implement calming skills for use at bedtime. Target Date: 2023-11-07 Frequency: Biweekly Progress: 10 Modality: individual Related Interventions 6. Teach the client relaxation skills (e.g., progressive muscle relaxation, guided imagery, slow diaphragmatic breathing); teach the client how to apply these skills to facilitate relaxation and sleep at bedtime (see "Bedtime Relaxation Techniques" by Cherie Dark). 7. Refer the client for or conduct biofeedback training to strengthen the client's successful relaxation response. Objective Practice good sleep hygiene. Target Date: 2023-11-07 Frequency: Biweekly Progress: 10 Modality: individual Related Interventions 8. Instruct the client in sleep hygiene practices such as restricting excessive liquid intake, spicy  late night snacks, or heavy evening meals; exercising regularly, but not within 3 - 4 hours of bedtime; minimizing or avoiding caffeine, alcohol, tobacco, and stimulant intake (or assign "Sleep Pattern Record" in the Adult  Psychotherapy Administrator, arts by Stephannie Li). Diagnosis :  Primary Insomnia  Conditions For Discharge Achievement of treatment goals and objectives  Salomon Fick, LCSW

## 2022-12-14 ENCOUNTER — Encounter: Payer: Self-pay | Admitting: Family Medicine

## 2022-12-14 ENCOUNTER — Other Ambulatory Visit: Payer: Self-pay | Admitting: Family Medicine

## 2022-12-14 DIAGNOSIS — R338 Other retention of urine: Secondary | ICD-10-CM | POA: Diagnosis not present

## 2022-12-14 DIAGNOSIS — R300 Dysuria: Secondary | ICD-10-CM

## 2022-12-14 MED ORDER — SULFAMETHOXAZOLE-TRIMETHOPRIM 800-160 MG PO TABS
1.0000 | ORAL_TABLET | Freq: Two times a day (BID) | ORAL | 0 refills | Status: AC
Start: 2022-12-14 — End: 2022-12-21

## 2022-12-18 DIAGNOSIS — H31091 Other chorioretinal scars, right eye: Secondary | ICD-10-CM | POA: Diagnosis not present

## 2022-12-18 DIAGNOSIS — H25813 Combined forms of age-related cataract, bilateral: Secondary | ICD-10-CM | POA: Diagnosis not present

## 2022-12-18 DIAGNOSIS — H0102B Squamous blepharitis left eye, upper and lower eyelids: Secondary | ICD-10-CM | POA: Diagnosis not present

## 2022-12-18 DIAGNOSIS — H0102A Squamous blepharitis right eye, upper and lower eyelids: Secondary | ICD-10-CM | POA: Diagnosis not present

## 2022-12-24 ENCOUNTER — Telehealth: Payer: Self-pay | Admitting: Family Medicine

## 2022-12-24 NOTE — Telephone Encounter (Signed)
Copied from CRM [id number]. Topic: Medicare AWV >> Dec 24, 2022 10:14 AM Payton Doughty wrote: Reason for CRM: LM 12/24/22 to R/S AWV appt.  We have openings on 12/25/22 in afternoon  [facility]; Care Guide Ambulatory Clinical Support [place] [facility] Direct Dial: [phone]

## 2022-12-26 DIAGNOSIS — R300 Dysuria: Secondary | ICD-10-CM | POA: Diagnosis not present

## 2022-12-26 DIAGNOSIS — R338 Other retention of urine: Secondary | ICD-10-CM | POA: Diagnosis not present

## 2022-12-26 DIAGNOSIS — R350 Frequency of micturition: Secondary | ICD-10-CM | POA: Diagnosis not present

## 2022-12-27 DIAGNOSIS — M545 Low back pain, unspecified: Secondary | ICD-10-CM | POA: Diagnosis not present

## 2022-12-27 DIAGNOSIS — M7062 Trochanteric bursitis, left hip: Secondary | ICD-10-CM | POA: Diagnosis not present

## 2023-01-02 ENCOUNTER — Ambulatory Visit (INDEPENDENT_AMBULATORY_CARE_PROVIDER_SITE_OTHER): Payer: Medicare Other | Admitting: Psychology

## 2023-01-02 DIAGNOSIS — F4322 Adjustment disorder with anxiety: Secondary | ICD-10-CM | POA: Diagnosis not present

## 2023-01-02 DIAGNOSIS — F5104 Psychophysiologic insomnia: Secondary | ICD-10-CM | POA: Diagnosis not present

## 2023-01-02 NOTE — Progress Notes (Signed)
[facility] Counselor/Therapist Progress Note  Patient ID: Ronnie Payne, MRN: [id number],    Date: 01/02/2023  Time Spent: 1:00pm-1:55pm    55 minutes   Treatment Type: Individual Therapy  Reported Symptoms: stress  Mental Status Exam: Appearance:  Casual     Behavior: Appropriate  Motor: Normal  Speech/Language:  Normal Rate  Affect: Appropriate  Mood: normal  Thought process: normal  Thought content:   WNL  Sensory/Perceptual disturbances:   WNL  Orientation: oriented to person, place, time/date, and situation  Attention: Good  Concentration: Good  Memory: WNL  Fund of knowledge:  Good  Insight:   Good  Judgment:  Good  Impulse Control: Good   Risk Assessment: Danger to Self:  No Self-injurious Behavior: No Danger to Others: No Duty to Warn:no Physical Aggression / Violence:No  Access to Firearms a concern: No  Gang Involvement:No   Subjective: Ronnie Payne present for face-to-face individual therapy in person.   He completed his sleep diaries.   Reviewed the 2 weeks of data and calculated Average Time in Bed (TIB) to be  6.5 hours and Average Sleep Time (ST) to be 6.4 hours.   Ronnie Payne did a good job adhering to the sleep prescription.  He did not nap during the day.  He adhered to sleep and rise times.   Ronnie Payne states he is still very sleepy when his alarm goes off at 6am.   Since his middle of the night awakenings continue to be minimal and he gets right back to sleep, we will extend Ronnie Payne's time in bed to 7 hours.  Gave Ronnie Payne sleep prescription to have total time in bed be 7 hours.   Determined going to bed time to be 11:30pm and rise time to be 6:30am.   Gave Ronnie Payne homework to continue to complete the sleep diaries.    Ronnie Payne states he is very pleased with the progress he is making and he feels better.   He is not feeling as anxious about sleep.   Plan to meet in 2 weeks.    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  Ronnie Payne participated in setting treatment goals and agrees with treatment plan.   Ronnie Payne's goals are to increase sleep and improve coping skills.  Plan to meet every two weeks.    Treatment Plan Client Abilities/Strengths  Ronnie Payne is bright, engaging and motivated for therapy.  Client Treatment Preferences  Individual therapy.  Client Statement of Needs  Improve coping skills.  Symptoms  Worrying, difficulty sleeping. Problems Addressed  Anxiety Goals 1. Enhance ability to effectively cope with the full variety of life's worries and anxieties. 2. Learn and implement coping skills that result in a reduction of anxiety and worry, and improved daily functioning. Objective Learn to accept limitations in life and commit to tolerating, rather than avoiding, unpleasant emotions while accomplishing meaningful goals. Target Date: 2023-11-07 Frequency: Biweekly Progress: 10 Modality: individual Related Interventions 1. Use techniques from Acceptance and Commitment Therapy to help client accept uncomfortable realities such as lack of complete control, imperfections, and uncertainty and tolerate unpleasant emotions and thoughts in order to accomplish value-consistent goals. Objective Learn and implement problem-solving strategies for realistically addressing worries. Target Date: 2023-11-07 Frequency: Biweekly Progress: 10 Modality: individual Related Interventions 1. Assign the client a homework exercise in which he/she problem-solves a current problem.  review, reinforce success, and provide corrective feedback toward improvement. 2. Teach the client problem-solving strategies involving specifically  defining a problem, generating options for addressing it, evaluating the pros and cons of each option, selecting and implementing an optional action, and reevaluating and refining the action. Objective Learn and implement calming skills to reduce overall anxiety and manage anxiety  symptoms. Target Date: 2023-11-07 Frequency: Biweekly Progress: 10 Modality: individual Related Interventions 1. Assign the client to read about progressive muscle relaxation and other calming strategies in relevant books or treatment manuals (e.g., Progressive Relaxation Training by Robb Matar and Alen Blew; Mastery of Your Anxiety and Worry: Workbook by Earlie Counts). 2. Assign the client homework each session in which he/she practices relaxation exercises daily, gradually applying them progressively from non-anxiety-provoking to anxiety-provoking situations; review and reinforce success while providing corrective feedback toward improvement. 3. Teach the client calming/relaxation skills (e.g., applied relaxation, progressive muscle relaxation, cue controlled relaxation; mindful breathing; biofeedback) and how to discriminate better between relaxation and tension; teach the client how to apply these skills to his/her daily life. 3. Reduce overall frequency, intensity, and duration of the anxiety so that daily functioning is not impaired. 4. Resolve the core conflict that is the source of anxiety. 5. Stabilize anxiety level while increasing ability to function on a daily basis. 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  Treatment Plan  Symptoms  Complains of difficulty falling asleep.  Complains of difficulty remaining asleep. Problems Addressed  Sleep Disturbance, Sleep Disturbance  Goals 1. Feel refreshed and energetic during wakeful hours. 2. Restore restful sleep pattern. Objective Learn and implement stimulus control strategies to establish a consistent sleep-wake rhythm. Target Date: 2023-11-07 Frequency: Biweekly Progress: 10 Modality: individual Related Interventions 1. Discuss with the client the rationale for stimulus control strategies to establish a consistent sleep-wake cycle (see  Behavioral Treatments for Sleep Disorders by Clarisa Kindred, and Kem Kays). 2. Teach the client stimulus control techniques (e.g., lie down to sleep only when sleepy; do not use the bed for activities like watching television, reading, listening to music, but only for sleep or sexual activity; get out of bed if sleep doesn't arrive soon after retiring; lie back down when sleepy; set alarm to the same wake-up time every morning regardless of sleep time or quality; do not nap during the day); assign consistent implementation. 3. Instruct the client to move activities associated with arousal and activation from the bedtime ritual to other times during the day (e.g., reading stimulating content, reviewing day's events, planning for next day, watching disturbing television). 4. Monitor the client's sleep patterns and compliance with stimulus control instructions; problem-solve obstacles and reinforce successful, consistent implementation. Objective Learn and implement a sleep restriction method to increase sleep efficiency. Target Date: 2023-11-07 Frequency: Biweekly Progress: 10 Modality: individual Related Interventions 5. Use a sleep restriction therapy approach in which the amount of time in bed is reduced to match the amount of time the patient typically sleeps (e.g., from 8 hours to 5), thus inducing systematic sleep deprivation; periodically adjust sleep time upward until an optimal sleep duration is reached. Objective Learn and implement calming skills for use at bedtime. Target Date: 2023-11-07 Frequency: Biweekly Progress: 10 Modality: individual Related Interventions 6. Teach the client relaxation skills (e.g., progressive muscle relaxation, guided imagery, slow diaphragmatic breathing); teach the client how to apply these skills to facilitate relaxation and sleep at bedtime (see "Bedtime Relaxation Techniques" by Cherie Dark). 7. Refer the client for or conduct biofeedback training to  strengthen the client's successful relaxation response. Objective  Practice good sleep hygiene. Target Date: 2023-11-07 Frequency: Biweekly Progress: 10 Modality: individual Related Interventions 8. Instruct the client in sleep hygiene practices such as restricting excessive liquid intake, spicy late night snacks, or heavy evening meals; exercising regularly, but not within 3 - 4 hours of bedtime; minimizing or avoiding caffeine, alcohol, tobacco, and stimulant intake (or assign "Sleep Pattern Record" in the Adult Psychotherapy Homework Planner by [facility]). Diagnosis :  Primary Insomnia  Conditions For Discharge Achievement of treatment goals and objectives  Salomon Fick, LCSW

## 2023-01-14 DIAGNOSIS — N401 Enlarged prostate with lower urinary tract symptoms: Secondary | ICD-10-CM | POA: Diagnosis not present

## 2023-01-14 DIAGNOSIS — R338 Other retention of urine: Secondary | ICD-10-CM | POA: Diagnosis not present

## 2023-01-14 DIAGNOSIS — R300 Dysuria: Secondary | ICD-10-CM | POA: Diagnosis not present

## 2023-01-15 DIAGNOSIS — L853 Xerosis cutis: Secondary | ICD-10-CM | POA: Diagnosis not present

## 2023-01-15 DIAGNOSIS — L570 Actinic keratosis: Secondary | ICD-10-CM | POA: Diagnosis not present

## 2023-01-16 ENCOUNTER — Other Ambulatory Visit: Payer: Self-pay | Admitting: Family Medicine

## 2023-01-16 MED ORDER — LOSARTAN POTASSIUM 25 MG PO TABS: 25.0000 mg | ORAL_TABLET | Freq: Every day | ORAL | 2 refills | Status: DC

## 2023-01-23 ENCOUNTER — Ambulatory Visit (INDEPENDENT_AMBULATORY_CARE_PROVIDER_SITE_OTHER): Payer: Medicare Other | Admitting: Psychology

## 2023-01-23 DIAGNOSIS — F4322 Adjustment disorder with anxiety: Secondary | ICD-10-CM

## 2023-01-23 DIAGNOSIS — F5104 Psychophysiologic insomnia: Secondary | ICD-10-CM | POA: Diagnosis not present

## 2023-01-23 NOTE — Progress Notes (Signed)
[facility] Counselor/Therapist Progress Note  Patient ID: CRHISTOPHER WALTNER, MRN: [id number],    Date: 01/23/2023  Time Spent: 12:00pm-12:55pm    55 minutes   Treatment Type: Individual Therapy  Reported Symptoms: stress  Mental Status Exam: Appearance:  Casual     Behavior: Appropriate  Motor: Normal  Speech/Language:  Normal Rate  Affect: Appropriate  Mood: normal  Thought process: normal  Thought content:   WNL  Sensory/Perceptual disturbances:   WNL  Orientation: oriented to person, place, time/date, and situation  Attention: Good  Concentration: Good  Memory: WNL  Fund of knowledge:  Good  Insight:   Good  Judgment:  Good  Impulse Control: Good   Risk Assessment: Danger to Self:  No Self-injurious Behavior: No Danger to Others: No Duty to Warn:no Physical Aggression / Violence:No  Access to Firearms a concern: No  Gang Involvement:No   Subjective: Pt present for face-to-face individual therapy in person.   He completed his sleep diaries.   Reviewed the 2 weeks of data and calculated Average Time in Bed (TIB) to be  7 hours and Average Sleep Time (ST) to be 6.8 hours.   Pt did a good job adhering to the sleep prescription.  He did not nap during the day.  He adhered to sleep and rise times.   Pt states he is still very sleepy when his alarm goes off at 6:30 am.   Since his middle of the night awakenings continue to be minimal and he gets right back to sleep, we will extend pt's time in bed to 7.5 hours.  Gave pt sleep prescription to have total time in bed be 7.5 hours.   Determined going to bed time to be 11:30pm and rise time to be 7:00am.   Gave pt homework to continue to complete the sleep diaries.    Pt states he is very pleased with the progress he is making and he feels better.   He is not feeling as anxious about sleep.  However, he is a little worried that extending the sleep time by 30 minutes may disrupt his sleep in the middle of the night.   Addressed pt's worries and made plan for how he can trouble shoot this if it happens.    Plan to meet in 2 weeks.    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  Pt participated in setting treatment goals and agrees with treatment plan.   Pt's goals are to increase sleep and improve coping skills.  Plan to meet every two weeks.    Treatment Plan Client Abilities/Strengths  Pt is bright, engaging and motivated for therapy.  Client Treatment Preferences  Individual therapy.  Client Statement of Needs  Improve coping skills.  Symptoms  Worrying, difficulty sleeping. Problems Addressed  Anxiety Goals 1. Enhance ability to effectively cope with the full variety of life's worries and anxieties. 2. Learn and implement coping skills that result in a reduction of anxiety and worry, and improved daily functioning. Objective Learn to accept limitations in life and commit to tolerating, rather than avoiding, unpleasant emotions while accomplishing meaningful goals. Target Date: 2023-11-07 Frequency: Biweekly Progress: 10 Modality: individual Related Interventions 1. Use techniques from Acceptance and Commitment Therapy to help client accept uncomfortable realities such as lack of complete control, imperfections, and uncertainty and tolerate unpleasant emotions and thoughts in order to accomplish value-consistent goals. Objective Learn and implement problem-solving strategies for realistically addressing  worries. Target Date: 2023-11-07 Frequency: Biweekly Progress: 10 Modality: individual Related Interventions 1. Assign the client a homework exercise in which he/she problem-solves a current problem.  review, reinforce success, and provide corrective feedback toward improvement. 2. Teach the client problem-solving strategies involving specifically defining a problem, generating options for addressing it, evaluating the  pros and cons of each option, selecting and implementing an optional action, and reevaluating and refining the action. Objective Learn and implement calming skills to reduce overall anxiety and manage anxiety symptoms. Target Date: 2023-11-07 Frequency: Biweekly Progress: 10 Modality: individual Related Interventions 1. Assign the client to read about progressive muscle relaxation and other calming strategies in relevant books or treatment manuals (e.g., Progressive Relaxation Training by Robb Matar and Alen Blew; Mastery of Your Anxiety and Worry: Workbook by Earlie Counts). 2. Assign the client homework each session in which he/she practices relaxation exercises daily, gradually applying them progressively from non-anxiety-provoking to anxiety-provoking situations; review and reinforce success while providing corrective feedback toward improvement. 3. Teach the client calming/relaxation skills (e.g., applied relaxation, progressive muscle relaxation, cue controlled relaxation; mindful breathing; biofeedback) and how to discriminate better between relaxation and tension; teach the client how to apply these skills to his/her daily life. 3. Reduce overall frequency, intensity, and duration of the anxiety so that daily functioning is not impaired. 4. Resolve the core conflict that is the source of anxiety. 5. Stabilize anxiety level while increasing ability to function on a daily basis. 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  Treatment Plan  Symptoms  Complains of difficulty falling asleep.  Complains of difficulty remaining asleep. Problems Addressed  Sleep Disturbance, Sleep Disturbance  Goals 1. Feel refreshed and energetic during wakeful hours. 2. Restore restful sleep pattern. Objective Learn and implement stimulus control strategies to establish a consistent sleep-wake rhythm. Target Date:  2023-11-07 Frequency: Biweekly Progress: 10 Modality: individual Related Interventions 1. Discuss with the client the rationale for stimulus control strategies to establish a consistent sleep-wake cycle (see Behavioral Treatments for Sleep Disorders by Clarisa Kindred, and Kem Kays). 2. Teach the client stimulus control techniques (e.g., lie down to sleep only when sleepy; do not use the bed for activities like watching television, reading, listening to music, but only for sleep or sexual activity; get out of bed if sleep doesn't arrive soon after retiring; lie back down when sleepy; set alarm to the same wake-up time every morning regardless of sleep time or quality; do not nap during the day); assign consistent implementation. 3. Instruct the client to move activities associated with arousal and activation from the bedtime ritual to other times during the day (e.g., reading stimulating content, reviewing day's events, planning for next day, watching disturbing television). 4. Monitor the client's sleep patterns and compliance with stimulus control instructions; problem-solve obstacles and reinforce successful, consistent implementation. Objective Learn and implement a sleep restriction method to increase sleep efficiency. Target Date: 2023-11-07 Frequency: Biweekly Progress: 10 Modality: individual Related Interventions 5. Use a sleep restriction therapy approach in which the amount of time in bed is reduced to match the amount of time the patient typically sleeps (e.g., from 8 hours to 5), thus inducing systematic sleep deprivation; periodically adjust sleep time upward until an optimal sleep duration is reached. Objective Learn and implement calming skills for use at bedtime. Target Date: 2023-11-07 Frequency: Biweekly Progress: 10 Modality: individual Related Interventions 6. Teach the client relaxation skills (e.g., progressive muscle relaxation, guided imagery,  slow diaphragmatic breathing); teach  the client how to apply these skills to facilitate relaxation and sleep at bedtime (see "Bedtime Relaxation Techniques" by Cherie Dark). 7. Refer the client for or conduct biofeedback training to strengthen the client's successful relaxation response. Objective Practice good sleep hygiene. Target Date: 2023-11-07 Frequency: Biweekly Progress: 10 Modality: individual Related Interventions 8. Instruct the client in sleep hygiene practices such as restricting excessive liquid intake, spicy late night snacks, or heavy evening meals; exercising regularly, but not within 3 - 4 hours of bedtime; minimizing or avoiding caffeine, alcohol, tobacco, and stimulant intake (or assign "Sleep Pattern Record" in the Adult Psychotherapy Homework Planner by [facility]). Diagnosis :  Primary Insomnia  Conditions For Discharge Achievement of treatment goals and objectives  Salomon Fick, LCSW

## 2023-02-06 ENCOUNTER — Ambulatory Visit: Payer: Medicare Other | Admitting: Psychology

## 2023-02-06 DIAGNOSIS — F5104 Psychophysiologic insomnia: Secondary | ICD-10-CM

## 2023-02-06 DIAGNOSIS — F4322 Adjustment disorder with anxiety: Secondary | ICD-10-CM | POA: Diagnosis not present

## 2023-02-06 NOTE — Progress Notes (Signed)
[facility] Counselor/Therapist Progress Note  Patient ID: ORVIS STANN, MRN: [id number],    Date: 02/06/2023  Time Spent: 3:00pm-3:50pm    50 minutes   Treatment Type: Individual Therapy  Reported Symptoms: stress  Mental Status Exam: Appearance:  Casual     Behavior: Appropriate  Motor: Normal  Speech/Language:  Normal Rate  Affect: Appropriate  Mood: normal  Thought process: normal  Thought content:   WNL  Sensory/Perceptual disturbances:   WNL  Orientation: oriented to person, place, time/date, and situation  Attention: Good  Concentration: Good  Memory: WNL  Fund of knowledge:  Good  Insight:   Good  Judgment:  Good  Impulse Control: Good   Risk Assessment: Danger to Self:  No Self-injurious Behavior: No Danger to Others: No Duty to Warn:no Physical Aggression / Violence:No  Access to Firearms a concern: No  Gang Involvement:No   Subjective: Pt present for face-to-face individual therapy in person.   He completed his sleep diaries.   Reviewed the 2 weeks of data and calculated Average Time in Bed (TIB) to be  7.5 hours and Average Sleep Time (ST) to be 7.2 hours.   Pt did a good job adhering to the sleep prescription.  He did not nap during the day.  He adhered to sleep and rise times.   Pt states he feels rested when he gets up at 7:00am.  He has a couple of middle of the night awakenings each night but gets right back to sleep. Pt feels this is a good sleep schedule for him so we will continue with the sleep prescription to have total time in bed be 7.5 hours.   Will continue to have going to bed time to be 11:30pm and rise time to be 7:00am.   Gave pt homework to continue to complete the sleep diaries.    Pt states he is very pleased with the progress he is making and he feels better.   He is not feeling as anxious about sleep.    Plan to meet in 2 weeks.    Interventions: Cognitive Behavioral Therapy  Diagnosis:  F43.22 and  F50.04   Plan of Care: Recommend ongoing therapy.  Pt participated in setting treatment goals and agrees with treatment plan.   Pt's goals are to increase sleep and improve coping skills.  Plan to meet every two weeks.    Treatment Plan Client Abilities/Strengths  Pt is bright, engaging and motivated for therapy.  Client Treatment Preferences  Individual therapy.  Client Statement of Needs  Improve coping skills.  Symptoms  Worrying, difficulty sleeping. Problems Addressed  Anxiety Goals 1. Enhance ability to effectively cope with the full variety of life's worries and anxieties. 2. Learn and implement coping skills that result in a reduction of anxiety and worry, and improved daily functioning. Objective Learn to accept limitations in life and commit to tolerating, rather than avoiding, unpleasant emotions while accomplishing meaningful goals. Target Date: 2023-11-07 Frequency: Biweekly Progress: 10 Modality: individual Related Interventions 1. Use techniques from Acceptance and Commitment Therapy to help client accept uncomfortable realities such as lack of complete control, imperfections, and uncertainty and tolerate unpleasant emotions and thoughts in order to accomplish value-consistent goals. Objective Learn and implement problem-solving strategies for realistically addressing worries. Target Date: 2023-11-07 Frequency: Biweekly Progress: 10 Modality: individual Related Interventions 1. Assign the client a homework exercise in which he/she problem-solves a current problem.  review, reinforce success, and provide corrective feedback toward improvement. 2. Teach the client problem-solving  strategies involving specifically defining a problem, generating options for addressing it, evaluating the pros and cons of each option, selecting and implementing an optional action, and reevaluating and refining the action. Objective Learn and implement calming skills to reduce overall anxiety  and manage anxiety symptoms. Target Date: 2023-11-07 Frequency: Biweekly Progress: 10 Modality: individual Related Interventions 1. Assign the client to read about progressive muscle relaxation and other calming strategies in relevant books or treatment manuals (e.g., Progressive Relaxation Training by Robb Matar and Alen Blew; Mastery of Your Anxiety and Worry: Workbook by Earlie Counts). 2. Assign the client homework each session in which he/she practices relaxation exercises daily, gradually applying them progressively from non-anxiety-provoking to anxiety-provoking situations; review and reinforce success while providing corrective feedback toward improvement. 3. Teach the client calming/relaxation skills (e.g., applied relaxation, progressive muscle relaxation, cue controlled relaxation; mindful breathing; biofeedback) and how to discriminate better between relaxation and tension; teach the client how to apply these skills to his/her daily life. 3. Reduce overall frequency, intensity, and duration of the anxiety so that daily functioning is not impaired. 4. Resolve the core conflict that is the source of anxiety. 5. Stabilize anxiety level while increasing ability to function on a daily basis. 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  Treatment Plan  Symptoms  Complains of difficulty falling asleep.  Complains of difficulty remaining asleep. Problems Addressed  Sleep Disturbance, Sleep Disturbance  Goals 1. Feel refreshed and energetic during wakeful hours. 2. Restore restful sleep pattern. Objective Learn and implement stimulus control strategies to establish a consistent sleep-wake rhythm. Target Date: 2023-11-07 Frequency: Biweekly Progress: 10 Modality: individual Related Interventions 1. Discuss with the client the rationale for stimulus control strategies to establish a consistent  sleep-wake cycle (see Behavioral Treatments for Sleep Disorders by Clarisa Kindred, and Kem Kays). 2. Teach the client stimulus control techniques (e.g., lie down to sleep only when sleepy; do not use the bed for activities like watching television, reading, listening to music, but only for sleep or sexual activity; get out of bed if sleep doesn't arrive soon after retiring; lie back down when sleepy; set alarm to the same wake-up time every morning regardless of sleep time or quality; do not nap during the day); assign consistent implementation. 3. Instruct the client to move activities associated with arousal and activation from the bedtime ritual to other times during the day (e.g., reading stimulating content, reviewing day's events, planning for next day, watching disturbing television). 4. Monitor the client's sleep patterns and compliance with stimulus control instructions; problem-solve obstacles and reinforce successful, consistent implementation. Objective Learn and implement a sleep restriction method to increase sleep efficiency. Target Date: 2023-11-07 Frequency: Biweekly Progress: 10 Modality: individual Related Interventions 5. Use a sleep restriction therapy approach in which the amount of time in bed is reduced to match the amount of time the patient typically sleeps (e.g., from 8 hours to 5), thus inducing systematic sleep deprivation; periodically adjust sleep time upward until an optimal sleep duration is reached. Objective Learn and implement calming skills for use at bedtime. Target Date: 2023-11-07 Frequency: Biweekly Progress: 10 Modality: individual Related Interventions 6. Teach the client relaxation skills (e.g., progressive muscle relaxation, guided imagery, slow diaphragmatic breathing); teach the client how to apply these skills to facilitate relaxation and sleep at bedtime (see "Bedtime Relaxation Techniques" by Cherie Dark). 7. Refer the client for or conduct biofeedback  training to strengthen the client's successful  relaxation response. Objective Practice good sleep hygiene. Target Date: 2023-11-07 Frequency: Biweekly Progress: 10 Modality: individual Related Interventions 8. Instruct the client in sleep hygiene practices such as restricting excessive liquid intake, spicy late night snacks, or heavy evening meals; exercising regularly, but not within 3 - 4 hours of bedtime; minimizing or avoiding caffeine, alcohol, tobacco, and stimulant intake (or assign "Sleep Pattern Record" in the Adult Psychotherapy Homework Planner by [facility]). Diagnosis :  Primary Insomnia  Conditions For Discharge Achievement of treatment goals and objectives  Salomon Fick, LCSW

## 2023-02-20 ENCOUNTER — Ambulatory Visit (INDEPENDENT_AMBULATORY_CARE_PROVIDER_SITE_OTHER): Payer: Medicare Other | Admitting: Psychology

## 2023-02-20 DIAGNOSIS — F4322 Adjustment disorder with anxiety: Secondary | ICD-10-CM

## 2023-02-20 DIAGNOSIS — F5101 Primary insomnia: Secondary | ICD-10-CM

## 2023-02-20 NOTE — Progress Notes (Signed)
[facility] Counselor/Therapist Progress Note  Patient ID: Ronnie Payne, MRN: [id number],    Date: 02/20/2023  Time Spent: 3:00pm-3:50pm    50 minutes   Treatment Type: Individual Therapy  Reported Symptoms: stress  Mental Status Exam: Appearance:  Casual     Behavior: Appropriate  Motor: Normal  Speech/Language:  Normal Rate  Affect: Appropriate  Mood: normal  Thought process: normal  Thought content:   WNL  Sensory/Perceptual disturbances:   WNL  Orientation: oriented to person, place, time/date, and situation  Attention: Good  Concentration: Good  Memory: WNL  Fund of knowledge:  Good  Insight:   Good  Judgment:  Good  Impulse Control: Good   Risk Assessment: Danger to Self:  No Self-injurious Behavior: No Danger to Others: No Duty to Warn:no Physical Aggression / Violence:No  Access to Firearms a concern: No  Gang Involvement:No   Subjective: Pt present for face-to-face individual therapy in person.   He completed his sleep diaries.   Reviewed the 2 weeks of data and calculated Average Time in Bed (TIB) to be  7.5 hours and Average Sleep Time (ST) to be 7.4 hours.   Pt did a good job adhering to the sleep prescription.  He did not nap during the day.  He adhered to sleep and rise times.   Pt states he feels rested when he gets up at 7:00am.  He has a couple of middle of the night awakenings each night but gets right back to sleep. Pt feels this is a good sleep schedule for him so we will continue with the sleep prescription to have total time in bed be 7.5 hours.   Will continue to have going to bed time to be 11:30pm and rise time to be 7:00am.      Pt states he is very pleased with the progress he has made and he feels better.   He is not feeling as anxious about sleep.   Pt and therapist agree that pt can graduate from therapy since he has reached his goals.      Interventions: Cognitive Behavioral Therapy  Diagnosis:  F43.22 and  F50.04   Plan of Care: Recommend ongoing therapy.  Pt participated in setting treatment goals and agrees with treatment plan.   Pt's goals are to increase sleep and improve coping skills.  Plan to meet every two weeks.    Treatment Plan Client Abilities/Strengths  Pt is bright, engaging and motivated for therapy.  Client Treatment Preferences  Individual therapy.  Client Statement of Needs  Improve coping skills.  Symptoms  Worrying, difficulty sleeping. Problems Addressed  Anxiety Goals 1. Enhance ability to effectively cope with the full variety of life's worries and anxieties. 2. Learn and implement coping skills that result in a reduction of anxiety and worry, and improved daily functioning. Objective Learn to accept limitations in life and commit to tolerating, rather than avoiding, unpleasant emotions while accomplishing meaningful goals. Target Date: 2023-11-07 Frequency: Biweekly Progress: 10 Modality: individual Related Interventions 1. Use techniques from Acceptance and Commitment Therapy to help client accept uncomfortable realities such as lack of complete control, imperfections, and uncertainty and tolerate unpleasant emotions and thoughts in order to accomplish value-consistent goals. Objective Learn and implement problem-solving strategies for realistically addressing worries. Target Date: 2023-11-07 Frequency: Biweekly Progress: 10 Modality: individual Related Interventions 1. Assign the client a homework exercise in which he/she problem-solves a current problem.  review, reinforce success, and provide corrective feedback toward improvement. 2. Teach the client  problem-solving strategies involving specifically defining a problem, generating options for addressing it, evaluating the pros and cons of each option, selecting and implementing an optional action, and reevaluating and refining the action. Objective Learn and implement calming skills to reduce overall anxiety  and manage anxiety symptoms. Target Date: 2023-11-07 Frequency: Biweekly Progress: 10 Modality: individual Related Interventions 1. Assign the client to read about progressive muscle relaxation and other calming strategies in relevant books or treatment manuals (e.g., Progressive Relaxation Training by Robb Matar and Alen Blew; Mastery of Your Anxiety and Worry: Workbook by Earlie Counts). 2. Assign the client homework each session in which he/she practices relaxation exercises daily, gradually applying them progressively from non-anxiety-provoking to anxiety-provoking situations; review and reinforce success while providing corrective feedback toward improvement. 3. Teach the client calming/relaxation skills (e.g., applied relaxation, progressive muscle relaxation, cue controlled relaxation; mindful breathing; biofeedback) and how to discriminate better between relaxation and tension; teach the client how to apply these skills to his/her daily life. 3. Reduce overall frequency, intensity, and duration of the anxiety so that daily functioning is not impaired. 4. Resolve the core conflict that is the source of anxiety. 5. Stabilize anxiety level while increasing ability to function on a daily basis. 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  Treatment Plan  Symptoms  Complains of difficulty falling asleep.  Complains of difficulty remaining asleep. Problems Addressed  Sleep Disturbance, Sleep Disturbance  Goals 1. Feel refreshed and energetic during wakeful hours. 2. Restore restful sleep pattern. Objective Learn and implement stimulus control strategies to establish a consistent sleep-wake rhythm. Target Date: 2023-11-07 Frequency: Biweekly Progress: 10 Modality: individual Related Interventions 1. Discuss with the client the rationale for stimulus control strategies to establish a consistent  sleep-wake cycle (see Behavioral Treatments for Sleep Disorders by Clarisa Kindred, and Kem Kays). 2. Teach the client stimulus control techniques (e.g., lie down to sleep only when sleepy; do not use the bed for activities like watching television, reading, listening to music, but only for sleep or sexual activity; get out of bed if sleep doesn't arrive soon after retiring; lie back down when sleepy; set alarm to the same wake-up time every morning regardless of sleep time or quality; do not nap during the day); assign consistent implementation. 3. Instruct the client to move activities associated with arousal and activation from the bedtime ritual to other times during the day (e.g., reading stimulating content, reviewing day's events, planning for next day, watching disturbing television). 4. Monitor the client's sleep patterns and compliance with stimulus control instructions; problem-solve obstacles and reinforce successful, consistent implementation. Objective Learn and implement a sleep restriction method to increase sleep efficiency. Target Date: 2023-11-07 Frequency: Biweekly Progress: 10 Modality: individual Related Interventions 5. Use a sleep restriction therapy approach in which the amount of time in bed is reduced to match the amount of time the patient typically sleeps (e.g., from 8 hours to 5), thus inducing systematic sleep deprivation; periodically adjust sleep time upward until an optimal sleep duration is reached. Objective Learn and implement calming skills for use at bedtime. Target Date: 2023-11-07 Frequency: Biweekly Progress: 10 Modality: individual Related Interventions 6. Teach the client relaxation skills (e.g., progressive muscle relaxation, guided imagery, slow diaphragmatic breathing); teach the client how to apply these skills to facilitate relaxation and sleep at bedtime (see "Bedtime Relaxation Techniques" by Cherie Dark). 7. Refer the client for or conduct biofeedback  training to strengthen the client's  successful relaxation response. Objective Practice good sleep hygiene. Target Date: 2023-11-07 Frequency: Biweekly Progress: 10 Modality: individual Related Interventions 8. Instruct the client in sleep hygiene practices such as restricting excessive liquid intake, spicy late night snacks, or heavy evening meals; exercising regularly, but not within 3 - 4 hours of bedtime; minimizing or avoiding caffeine, alcohol, tobacco, and stimulant intake (or assign "Sleep Pattern Record" in the Adult Psychotherapy Homework Planner by [facility]). Diagnosis :  Primary Insomnia  Conditions For Discharge Achievement of treatment goals and objectives  Salomon Fick, LCSW

## 2023-02-25 ENCOUNTER — Telehealth: Payer: Self-pay | Admitting: Family Medicine

## 2023-02-25 NOTE — Telephone Encounter (Signed)
Copied from CRM [id number]. Topic: Medicare AWV >> Feb 25, 2023 11:04 AM Payton Doughty wrote: Reason for CRM: Called LVM 02/26/2023 to schedule Annual Wellness Visit  Verlee Rossetti; Care Guide Ambulatory Clinical Support [place] [facility] Direct Dial: [phone]

## 2023-03-04 DIAGNOSIS — M6289 Other specified disorders of muscle: Secondary | ICD-10-CM | POA: Diagnosis not present

## 2023-03-04 DIAGNOSIS — R351 Nocturia: Secondary | ICD-10-CM | POA: Diagnosis not present

## 2023-03-06 ENCOUNTER — Ambulatory Visit: Payer: Medicare Other | Admitting: Psychology

## 2023-03-20 ENCOUNTER — Ambulatory Visit: Payer: Medicare Other | Admitting: Psychology

## 2023-03-20 DIAGNOSIS — L299 Pruritus, unspecified: Secondary | ICD-10-CM | POA: Diagnosis not present

## 2023-04-16 ENCOUNTER — Ambulatory Visit: Payer: Medicare Other | Admitting: Family Medicine

## 2023-04-16 DIAGNOSIS — N401 Enlarged prostate with lower urinary tract symptoms: Secondary | ICD-10-CM | POA: Diagnosis not present

## 2023-04-16 DIAGNOSIS — N5201 Erectile dysfunction due to arterial insufficiency: Secondary | ICD-10-CM | POA: Diagnosis not present

## 2023-04-16 DIAGNOSIS — R350 Frequency of micturition: Secondary | ICD-10-CM | POA: Diagnosis not present

## 2023-04-23 ENCOUNTER — Ambulatory Visit: Payer: Medicare Other | Admitting: Family Medicine

## 2023-04-23 DIAGNOSIS — L299 Pruritus, unspecified: Secondary | ICD-10-CM | POA: Diagnosis not present

## 2023-04-23 DIAGNOSIS — J3089 Other allergic rhinitis: Secondary | ICD-10-CM | POA: Diagnosis not present

## 2023-04-25 DIAGNOSIS — H0102A Squamous blepharitis right eye, upper and lower eyelids: Secondary | ICD-10-CM | POA: Diagnosis not present

## 2023-04-25 DIAGNOSIS — H0102B Squamous blepharitis left eye, upper and lower eyelids: Secondary | ICD-10-CM | POA: Diagnosis not present

## 2023-04-25 DIAGNOSIS — H31091 Other chorioretinal scars, right eye: Secondary | ICD-10-CM | POA: Diagnosis not present

## 2023-04-25 DIAGNOSIS — H25813 Combined forms of age-related cataract, bilateral: Secondary | ICD-10-CM | POA: Diagnosis not present

## 2023-05-03 ENCOUNTER — Ambulatory Visit (INDEPENDENT_AMBULATORY_CARE_PROVIDER_SITE_OTHER): Payer: Medicare Other | Admitting: Family Medicine

## 2023-05-03 ENCOUNTER — Encounter: Payer: Self-pay | Admitting: Family Medicine

## 2023-05-03 VITALS — BP 130/64 | HR 65 | Temp 97.8°F | Resp 16 | Ht 64.0 in | Wt 147.0 lb

## 2023-05-03 DIAGNOSIS — L299 Pruritus, unspecified: Secondary | ICD-10-CM

## 2023-05-03 DIAGNOSIS — I10 Essential (primary) hypertension: Secondary | ICD-10-CM

## 2023-05-03 DIAGNOSIS — R7303 Prediabetes: Secondary | ICD-10-CM

## 2023-05-03 DIAGNOSIS — E785 Hyperlipidemia, unspecified: Secondary | ICD-10-CM | POA: Diagnosis not present

## 2023-05-03 LAB — COMPREHENSIVE METABOLIC PANEL
ALT: 25 U/L (ref 0–53)
AST: 24 U/L (ref 0–37)
Albumin: 4.5 g/dL (ref 3.5–5.2)
Alkaline Phosphatase: 85 U/L (ref 39–117)
BUN: 17 mg/dL (ref 6–23)
CO2: 33 meq/L — ABNORMAL HIGH (ref 19–32)
Calcium: 9.4 mg/dL (ref 8.4–10.5)
Chloride: 102 meq/L (ref 96–112)
Creatinine, Ser: 1 mg/dL (ref 0.40–1.50)
GFR: 69.98 mL/min (ref >60.00)
Glucose, Bld: 95 mg/dL (ref 70–99)
Potassium: 4.2 meq/L (ref 3.5–5.1)
Sodium: 141 meq/L (ref 135–145)
Total Bilirubin: 1.1 mg/dL (ref 0.2–1.2)
Total Protein: 6.6 g/dL (ref 6.0–8.3)

## 2023-05-03 LAB — LIPID PANEL
Cholesterol: 177 mg/dL (ref 0–200)
HDL: 51.9 mg/dL (ref >39.00)
LDL Cholesterol: 104 mg/dL — ABNORMAL HIGH (ref 0–99)
NonHDL: 125.22
Total CHOL/HDL Ratio: 3
Triglycerides: 106 mg/dL (ref 0.0–149.0)
VLDL: 21.2 mg/dL (ref 0.0–40.0)

## 2023-05-03 LAB — HEMOGLOBIN A1C: Hgb A1c MFr Bld: 5.5 % (ref 4.6–6.5)

## 2023-05-03 MED ORDER — MONTELUKAST SODIUM 10 MG PO TABS: 10.0000 mg | ORAL_TABLET | Freq: Every day | ORAL | 3 refills | Status: DC

## 2023-05-03 NOTE — Progress Notes (Addendum)
Chief Complaint  Patient presents with   Follow-up    Follow up    Subjective Ronnie Payne is a 82 y.o. male who presents for hypertension follow up. He does monitor home blood pressures. Blood pressures ranging from 120's/60-70's on average. He is compliant with medications- atenolol 50 mg/d, losartan 25 mg/d Patient has these side effects of medication: none He is adhering to a healthy diet overall. Current exercise: racquetball, walking, lifting wts No CP or SOB.   Hyperlipidemia Patient presents for dyslipidemia follow up. Currently being treated with Lipitor 20 mg/d and compliance with treatment thus far has been good. He denies myalgias. Diet/exercise as above.  The patient is not known to have coexisting coronary artery disease.  Pruritus Over the past year and a half, the patient has had itching over his upper/mid back.  He went to dermatology and failed several topicals.  He was then referred to the allergy team who told him he was allergic to dust mites and needed to change his mattress.  He has continued to take Flonase, Zyrtec, and using Aquaphor.  Nothing has been helpful.  We did start losartan around that time.   Past Medical History:  Diagnosis Date   Arthritis    Cold sore    Frequent headaches    History of skin cancer    Hyperlipidemia    Hypertension    Prediabetes     Exam BP 130/64   Pulse 65   Temp 97.8 F (36.6 C) (Oral)   Resp 16   Ht 5\' 4"  (1.626 m)   Wt 147 lb (66.7 kg)   SpO2 97%   BMI 25.23 kg/m  General:  well developed, well nourished, in no apparent distress Heart: Reg rhythm, bradycardic, no bruits, no LE edema Lungs: clear to auscultation, no accessory muscle use Skin: Various SKs, lentigos and hemangiomas over his back.  No obvious lesion, erythema, or scaling. Psych: well oriented with normal range of affect and appropriate judgment/insight  Essential hypertension  Hyperlipidemia, unspecified hyperlipidemia type - Plan:  Comprehensive metabolic panel, Lipid panel  Prediabetes - Plan: Hemoglobin A1c  Chronic pruritus - Plan: montelukast (SINGULAIR) 10 MG tablet  Chronic, stable.  Continue losartan 25 mg daily, atenolol 50 mg daily.  Counseled on diet and exercise. Chronic, stable.  Continue Lipitor 20 mg daily. Check above. Continue Zyrtec and Flonase, will see if montelukast helps at all.  If it does not and he is still seeking care, could consider holding his losartan versus trying an antidepressant. F/u in 6 months. The patient voiced understanding and agreement to the plan.  Jilda Roche Moyock, DO 05/03/23  8:38 AM

## 2023-05-03 NOTE — Patient Instructions (Signed)
Give us 2-3 business days to get the results of your labs back.   Keep the diet clean and stay active.  Let us know if you need anything. 

## 2023-05-14 ENCOUNTER — Ambulatory Visit: Payer: Medicare Other | Admitting: Family Medicine

## 2023-05-14 DIAGNOSIS — H25811 Combined forms of age-related cataract, right eye: Secondary | ICD-10-CM | POA: Diagnosis not present

## 2023-05-14 DIAGNOSIS — H52221 Regular astigmatism, right eye: Secondary | ICD-10-CM | POA: Diagnosis not present

## 2023-05-23 DIAGNOSIS — H2512 Age-related nuclear cataract, left eye: Secondary | ICD-10-CM | POA: Diagnosis not present

## 2023-05-27 ENCOUNTER — Other Ambulatory Visit: Payer: Self-pay

## 2023-05-27 DIAGNOSIS — E785 Hyperlipidemia, unspecified: Secondary | ICD-10-CM

## 2023-05-27 MED ORDER — ATORVASTATIN CALCIUM 20 MG PO TABS: 20.0000 mg | ORAL_TABLET | Freq: Every day | ORAL | 3 refills | Status: DC

## 2023-05-28 DIAGNOSIS — H52222 Regular astigmatism, left eye: Secondary | ICD-10-CM | POA: Diagnosis not present

## 2023-05-28 DIAGNOSIS — H25812 Combined forms of age-related cataract, left eye: Secondary | ICD-10-CM | POA: Diagnosis not present

## 2023-05-29 ENCOUNTER — Other Ambulatory Visit: Payer: Self-pay | Admitting: Family Medicine

## 2023-05-29 ENCOUNTER — Ambulatory Visit: Payer: Medicare Other | Admitting: Family Medicine

## 2023-05-29 DIAGNOSIS — I10 Essential (primary) hypertension: Secondary | ICD-10-CM

## 2023-07-03 DIAGNOSIS — L814 Other melanin hyperpigmentation: Secondary | ICD-10-CM | POA: Diagnosis not present

## 2023-07-03 DIAGNOSIS — Z7189 Other specified counseling: Secondary | ICD-10-CM | POA: Diagnosis not present

## 2023-07-03 DIAGNOSIS — Z86007 Personal history of in-situ neoplasm of skin: Secondary | ICD-10-CM | POA: Diagnosis not present

## 2023-07-03 DIAGNOSIS — Z85820 Personal history of malignant melanoma of skin: Secondary | ICD-10-CM | POA: Diagnosis not present

## 2023-07-03 DIAGNOSIS — Z08 Encounter for follow-up examination after completed treatment for malignant neoplasm: Secondary | ICD-10-CM | POA: Diagnosis not present

## 2023-07-03 DIAGNOSIS — D229 Melanocytic nevi, unspecified: Secondary | ICD-10-CM | POA: Diagnosis not present

## 2023-07-03 DIAGNOSIS — L821 Other seborrheic keratosis: Secondary | ICD-10-CM | POA: Diagnosis not present

## 2023-07-03 DIAGNOSIS — Z85828 Personal history of other malignant neoplasm of skin: Secondary | ICD-10-CM | POA: Diagnosis not present

## 2023-07-03 DIAGNOSIS — Z872 Personal history of diseases of the skin and subcutaneous tissue: Secondary | ICD-10-CM | POA: Diagnosis not present

## 2023-07-12 ENCOUNTER — Ambulatory Visit: Admitting: Family Medicine

## 2023-08-12 DIAGNOSIS — J300 Vasomotor rhinitis: Secondary | ICD-10-CM | POA: Diagnosis not present

## 2023-08-12 DIAGNOSIS — H903 Sensorineural hearing loss, bilateral: Secondary | ICD-10-CM | POA: Diagnosis not present

## 2023-08-12 DIAGNOSIS — H6123 Impacted cerumen, bilateral: Secondary | ICD-10-CM | POA: Diagnosis not present

## 2023-09-06 ENCOUNTER — Encounter: Payer: Self-pay | Admitting: Internal Medicine

## 2023-09-06 ENCOUNTER — Other Ambulatory Visit: Payer: Self-pay

## 2023-09-06 ENCOUNTER — Ambulatory Visit (INDEPENDENT_AMBULATORY_CARE_PROVIDER_SITE_OTHER): Admitting: Internal Medicine

## 2023-09-06 VITALS — BP 126/60 | HR 53 | Temp 98.0°F | Resp 18 | Ht 65.0 in | Wt 148.4 lb

## 2023-09-06 DIAGNOSIS — L308 Other specified dermatitis: Secondary | ICD-10-CM | POA: Diagnosis not present

## 2023-09-06 DIAGNOSIS — J310 Chronic rhinitis: Secondary | ICD-10-CM

## 2023-09-06 MED ORDER — IPRATROPIUM BROMIDE 0.03 % NA SOLN: 2.0000 | Freq: Two times a day (BID) | NASAL | 12 refills | Status: DC

## 2023-09-06 NOTE — Patient Instructions (Addendum)
 Chronic rhinitis with gustatory rhinitis  Chronic allergic rhinitis with gustatory rhinorrhea. No significant nasal congestion or itchy, watery eyes. Previous Zyrtec use. Addressed concerns about nasal spray side effects, emphasizing minimal systemic absorption of ipratropium. - Prescribe nasal Atrovent (ipratropium) spray up to four times daily as needed. - Educated on nasal Atrovent use, emphasizing topical action and minimal systemic side effects. - Continue dust mite, roach and mold precautions   Pruritus with rash  Chronic pruritus on the back with excoriations and papules. No rash noted by dermatology. Temporary relief with Voltaren gel suggests neuropathic component. Differential includes contact dermatitis.  - Schedule patch testing for contact allergens. - Avoid prescription creams for four weeks prior to patch testing. - Consider topical steroids or TCNs if patch testing is negative. - Continue zyrtec 10mg  daily - Discuss potential biologics if other treatments fail, addressing patient concerns.  Follow up: patch testing    Thank you so much for letting me partake in your care today.  Don't hesitate to reach out if you have any additional concerns!  Ronnie Binet, MD  [facility] [place] [place]  DUST MITE AVOIDANCE MEASURES:  There are three main measures that need and can be taken to avoid house dust mites:  Reduce accumulation of dust in general -reduce furniture, clothing, carpeting, books, stuffed animals, especially in bedroom  Separate yourself from the dust -use pillow and mattress encasements (can be found at stores such as [employer] or online) -avoid direct exposure to air condition flow -use a HEPA filter device, especially in the bedroom; you can also use a HEPA filter vacuum cleaner -wipe dust with a moist towel instead of a dry towel or broom when cleaning  Decrease mites and/or their secretions -wash clothing and linen and  stuffed animals at highest temperature possible, at least every 2 weeks -stuffed animals can also be placed in a bag and put in a freezer overnight  Despite the above measures, it is impossible to eliminate dust mites or their allergen completely from your home.  With the above measures the burden of mites in your home can be diminished, with the goal of minimizing your allergic symptoms.  Success will be reached only when implementing and using all means together.  Control of Cockroach Allergen  Cockroach allergen has been identified as an important cause of acute attacks of asthma, especially in urban settings.  There are fifty-five species of cockroach that exist in the [place] , however only three, the [place], [place] and [place] species produce allergen that can affect patients with Asthma.  Allergens can be obtained from fecal particles, egg casings and secretions from cockroaches.    Remove food sources. Reduce access to water. Seal access and entry points. Spray runways with 0.5-1% Diazinon or Chlorpyrifos Blow boric acid power under stoves and refrigerator. Place bait stations (hydramethylnon) at feeding sites.  Control of Mold Allergen   Mold and fungi can grow on a variety of surfaces provided certain temperature and moisture conditions exist.  Outdoor molds grow on plants, decaying vegetation and soil.  The major outdoor mold, Alternaria and Cladosporium, are found in very high numbers during hot and dry conditions.  Generally, a late Summer - Fall peak is seen for common outdoor fungal spores.  Rain will temporarily lower outdoor mold spore count, but counts rise rapidly when the rainy period ends.  The most important indoor molds are Aspergillus and Penicillium.  Dark, humid and poorly ventilated basements are ideal sites  for mold growth.  The next most common sites of mold growth are the bathroom and the kitchen.  Outdoor (Seasonal) Mold Control  Use air conditioning and  keep windows closed Avoid exposure to decaying vegetation. Avoid leaf raking. Avoid grain handling. Consider wearing a face mask if working in moldy areas.    Indoor (Perennial) Mold Control   Maintain humidity below 50%. Clean washable surfaces with 5% bleach solution. Remove sources e.g. contaminated carpets.

## 2023-09-06 NOTE — Progress Notes (Signed)
 NEW PATIENT Date of Service/Encounter:  09/06/23 Referring provider: Jobe Mulder* Primary care provider: Jobe Mulder, DO  Subjective:  Ronnie Payne is a 83 y.o. male  presenting today for evaluation of rhinitis and pruritus  History obtained from: chart review and patient.   Discussed the use of AI scribe software for clinical note transcription with the patient, who gave verbal consent to proceed.  History of Present Illness Ronnie Payne is an 83 year old male who presents with persistent itching and runny nose.  He has been experiencing persistent itching, primarily on his back, for approximately two years. Despite multiple visits to dermatology and the use of various topical treatments, including calamine, Sarna, and Eucerin, he has not found relief. Voltaren gel provided temporary relief for about 24 hours, but he is concerned about potential side effects from its use on his back.  He underwent allergy testing at [facility], which revealed positive results for mold, dust mite, and roach. Despite these findings, he does not report significant respiratory symptoms, such as a runny or stuffy nose, except for mild runny nose in the mornings and when exposed to cold air. He takes Zyrtec daily to manage these symptoms as well as itch.   He experiences gustatory rhinorrhea, where his nose becomes slightly runny while eating. He has been prescribed a nasal spray in the past but was hesitant to use it due to concerns about side effects.  He has a history of changing his mattress due to concerns about dust mites, but this did not alleviate his symptoms. He has also invested in air purifiers for his home, although he is unsure of their impact on his condition. No tobacco exposure.     Chart Review:  Allergy test 04/23/2023 at  allergy positive to Rhizopus, penicillium, dust mite, roach  Other allergy screening: Asthma: no Rhino  conjunctivitis: yes Food allergy:  gustatory rhinorrhea  Medication allergy: yes Hymenoptera allergy: no Urticaria: no Eczema: Chronic pruritus History of recurrent infections suggestive of immunodeficency: no Vaccinations are up to date.   Past Medical History: Past Medical History:  Diagnosis Date   Arthritis    Cold sore    Frequent headaches    History of skin cancer    Hyperlipidemia    Hypertension    Prediabetes    Medication List:  Current Outpatient Medications  Medication Sig Dispense Refill   atenolol  (TENORMIN ) 50 MG tablet TAKE 1 TABLET(50 MG) BY MOUTH DAILY 90 tablet 3   atorvastatin  (LIPITOR) 20 MG tablet Take 1 tablet (20 mg total) by mouth daily. 90 tablet 3   losartan  (COZAAR ) 25 MG tablet Take 1 tablet (25 mg total) by mouth daily. 90 tablet 2   acyclovir  (ZOVIRAX ) 400 MG tablet TAKE 1 TABLET BY MOUTH THREE TIMES DAILY FOR 5 DAYS WHEN YOU HAVE A FLARE ON YOUR LIP (Patient not taking: Reported on 09/06/2023) 30 tablet 2   montelukast  (SINGULAIR ) 10 MG tablet Take 1 tablet (10 mg total) by mouth at bedtime. (Patient not taking: Reported on 09/06/2023) 30 tablet 3   No current facility-administered medications for this visit.   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  No water damage in the house wood floor in family room, engineering wood and bedroom.  Gas heat and heat pump.  No roaches in the house and bed is 2 feet off floor.  Dust mite precautions on bed and pillows.  He is a retired Airline pilot.  No tobacco exposure..   ROS:  All other systems negative except as noted per HPI.  Objective:  Blood pressure 126/60, pulse (!) 53, temperature 98 F (36.7 C), temperature source Temporal, resp. rate 18, height 5\' 5"  (1.651 m), weight 148 lb 6.4 oz (67.3 kg), SpO2 98%. Body mass index is 24.7 kg/m. Physical Exam:  General Appearance:  Alert, cooperative, no distress, appears stated age  Head:  Normocephalic, without obvious abnormality, atraumatic  Eyes:  Conjunctiva clear, EOM's intact  Ears EACs normal bilaterally  Nose: Nares normal, normal mucosa, no visible anterior polyps, and septum midline  Throat: Lips, tongue normal; teeth and gums normal, normal posterior oropharynx  Neck: Supple, symmetrical  Lungs:   clear to auscultation bilaterally, Respirations unlabored, no coughing  Heart:  regular rate and rhythm and no murmur, Appears well perfused  Extremities: No edema  Skin: Scattered areas of minutes with macules with rare excoriation marks on back and Skin color, texture, turgor normal  Neurologic: No gross deficits   Diagnostics: None done    Labs:  Lab Orders  No laboratory test(s) ordered today     Assessment and Plan  Assessment and Plan Assessment & Plan Chronic rhinitis with gustatory rhinitis  Chronic allergic rhinitis with gustatory rhinorrhea. No significant nasal congestion or itchy, watery eyes. Previous Zyrtec use. Addressed concerns about nasal spray side effects, emphasizing minimal systemic absorption of ipratropium. - Prescribe nasal Atrovent  (ipratropium) spray up to four times daily as needed. - Educated on nasal Atrovent use, emphasizing topical action and minimal systemic side effects. - Continue dust mite, roach and mold precautions   Pruritus with rash  Chronic pruritus on the back with excoriations and papules. No rash noted by dermatology. Temporary relief with Voltaren gel suggests neuropathic component. Differential includes contact dermatitis.  - Schedule patch testing for contact allergens. - Avoid prescription creams for four weeks prior to patch testing. - Consider topical steroids or TCNs if patch testing is negative. - Continue zyrtec 10mg  daily - Discuss potential biologics if other treatments fail, addressing patient concerns.  Follow up: patch testing   Thank you so much for letting me partake in your care today.  Don't hesitate to reach out if you have any additional concerns!  Orelia Binet, MD  [facility] [place], [place]       This note in its entirety was forwarded to the Provider who requested this consultation.  Other:     Thank you for your  kind referral. I appreciate the opportunity to take part in Vidyuth's care. Please do not hesitate to contact me with questions.  Sincerely,  Thank you so much for letting me partake in your care today.  Don't hesitate to reach out if you have any additional concerns!  Orelia Binet, MD  [facility] [place], [place]

## 2023-09-08 NOTE — Progress Notes (Unsigned)
 Follow-up Note  RE: Ronnie Payne MRN: [id number] DOB: [birth year] Date of Office Visit: 09/09/2023  Primary care provider: Jobe Mulder, DO Referring provider: Achille Ache returns to the office today for the patch test placement, given suspected history of contact dermatitis. Patch placement and strict avoidance of water near patches discussed in detail. Patient aware that patches need to stay dry.   Diagnostics: True Test patches placed.   Plan:   Allergic contact dermatitis - Instructions provided on care of the patches for the next 48 hours. Ronnie Payne was instructed to avoid showering for the next 48 hours. Amy Usher will follow up in 48 hours and 96 hours for patch readings.   Call the clinic if this treatment plan is not working well for you  Follow up in 2 days or sooner if needed.  Thank you for the opportunity to care for this patient.  Please do not hesitate to contact me with questions.  Marinus Sic, FNP [facility] [place]  [facility]

## 2023-09-09 ENCOUNTER — Ambulatory Visit (INDEPENDENT_AMBULATORY_CARE_PROVIDER_SITE_OTHER): Admitting: Family Medicine

## 2023-09-09 ENCOUNTER — Other Ambulatory Visit: Payer: Self-pay

## 2023-09-09 ENCOUNTER — Encounter: Payer: Self-pay | Admitting: Family Medicine

## 2023-09-09 VITALS — BP 130/68 | HR 57 | Temp 98.0°F | Resp 17 | Ht 63.78 in | Wt 148.5 lb

## 2023-09-09 DIAGNOSIS — L235 Allergic contact dermatitis due to other chemical products: Secondary | ICD-10-CM | POA: Diagnosis not present

## 2023-09-09 NOTE — Patient Instructions (Signed)
 Diagnostics: True Test patches placed.   Plan:   Allergic contact dermatitis - Instructions provided on care of the patches for the next 48 hours. Ronnie Payne was instructed to avoid showering for the next 48 hours. Ronnie Payne will follow up in 48 hours and 96 hours for patch readings.   Call the clinic if this treatment plan is not working well for you  Follow up in 2 days or sooner if needed.

## 2023-09-09 NOTE — Addendum Note (Signed)
 Addended by: Norville Beery, Estiben Mizuno on: 09/09/2023 04:17 PM   Modules accepted: Orders

## 2023-09-11 ENCOUNTER — Ambulatory Visit (INDEPENDENT_AMBULATORY_CARE_PROVIDER_SITE_OTHER): Admitting: Family

## 2023-09-11 ENCOUNTER — Encounter: Payer: Self-pay | Admitting: Family

## 2023-09-11 DIAGNOSIS — L235 Allergic contact dermatitis due to other chemical products: Secondary | ICD-10-CM

## 2023-09-11 NOTE — Progress Notes (Signed)
 Caynen returns to the office today for the 48 hour patch test interpretation, given suspected history of contact dermatitis.    Diagnostics:   TRUE TEST 48-hour hour reading: Borderline positive to fragrance mix and cobalt dye chloride.  Weak positive reaction to gold sodium thiosulfate   Plan:   Allergic contact dermatitis - The patient has been provided detailed information regarding the substances she is sensitive to, as well as products containing the substances.   - Meticulous avoidance of these substances is recommended.  - If avoidance is not possible, the use of barrier creams or lotions is recommended. - If symptoms persist or progress despite meticulous avoidance of fragrance mix, cobalt dye chloride, and gold sodium thiosulfate, Dermatology Referral may be warranted.   Tinnie Forehand, FNP [facility]

## 2023-09-13 ENCOUNTER — Encounter: Payer: Self-pay | Admitting: Family Medicine

## 2023-09-13 ENCOUNTER — Ambulatory Visit (INDEPENDENT_AMBULATORY_CARE_PROVIDER_SITE_OTHER): Admitting: Family Medicine

## 2023-09-13 DIAGNOSIS — L235 Allergic contact dermatitis due to other chemical products: Secondary | ICD-10-CM

## 2023-09-13 DIAGNOSIS — L259 Unspecified contact dermatitis, unspecified cause: Secondary | ICD-10-CM | POA: Insufficient documentation

## 2023-09-13 NOTE — Patient Instructions (Addendum)
   Diagnostics:   TRUE TEST 96-hour hour reading: positive reaction to  #6 (Fragrance mix), positive reaction to #7 (Colophony), positive reaction to #12 (Cobalt chloride), and positive reaction to #28 (Gold sodium thiosulfate)  Test results at 48 hours: positive to fragrance mix cobalt dichloride, and gold thiosulfate  Plan:   Allergic contact dermatitis - The patient has been provided detailed information regarding the substances she is sensitive to, as well as products containing the substances.   - Meticulous avoidance of these substances is recommended.  - If avoidance is not possible, the use of barrier creams or lotions is recommended. - If symptoms persist or progress despite meticulous avoidance of the substances as listed above, a dermatology referral may be warranted. - The sensitivity of patch testing can range from 60-80% and therefore false negative results are possible. Therefore, this does not definitively rule out contact dermatitis.  - A list of products that do not contain the products you are allergic to on our testing has been sent to your email.  Call the clinic if this treatment plan is not working well for you  Follow up in 6 months or sooner if needed.

## 2023-09-13 NOTE — Progress Notes (Cosign Needed)
    Follow-up Note  RE: Ronnie Payne MRN: [id number] DOB: 1940-11-10 Date of Office Visit: 09/13/2023  Primary care provider: Jobe Mulder, DO Referring provider: Achille Ache returns to the office today for the final patch test interpretation, given suspected history of contact dermatitis. Discussed methods of avoidance of the substances that he is allergic to. He reports that he does use some products with fragrance.    Diagnostics:   TRUE TEST 96-hour hour reading: positive reaction to  #6 (Fragrance mix), positive reaction to #7 (Colophony), positive reaction to #12 (Cobalt dichloride), and positive reaction to #28 (Gold sodium thiosulfate)  Test results at 48 hours: positive to fragrance mix cobalt dichloride, and gold thiosulfate  Plan:   Allergic contact dermatitis - The patient has been provided detailed information regarding the substances he is sensitive to, as well as products containing the substances.   - Meticulous avoidance of these substances is recommended.  - If avoidance is not possible, the use of barrier creams or lotions is recommended. - If symptoms persist or progress despite meticulous avoidance of the substances as listed above, a dermatology referral may be warranted. - The sensitivity of patch testing can range from 60-80% and therefore false negative results are possible. Therefore, this does not definitively rule out contact dermatitis.  - A list of products that do not contain the products you are allergic to on our testing has been sent to your email.  Call the clinic if this treatment plan is not working well for you  Follow up in 6 months or sooner if needed.  Thank you for the opportunity to care for this patient.  Please do not hesitate to contact me with questions.  Marinus Sic, FNP [facility]  [facility]

## 2023-09-13 NOTE — Addendum Note (Signed)
 Addended by: Norville Beery, Anaija Wissink on: 09/13/2023 05:15 PM   Modules accepted: Orders

## 2023-09-16 NOTE — Telephone Encounter (Signed)
 Patient called and stated that he has not received the email that you said you would send to him on Friday. Patients email is [email or web address] patients call back number is [phone]

## 2023-09-16 NOTE — Telephone Encounter (Signed)
 Email sent. My apology. Likely entered email incorrectly. Thank you

## 2023-09-17 NOTE — Telephone Encounter (Signed)
 I sent a test email to myself to see if theres a glitch in the system.  Here is what the greeting looks like. I have sent the message 2 more times over lunch to [facility]. The last one I copied and pasted the email address that he provided.    [facility] - Welcome to CAMP

## 2023-09-17 NOTE — Telephone Encounter (Signed)
 The paperwork is like hundreds of pages. Is there an alternate email we could send the link to?

## 2023-09-17 NOTE — Telephone Encounter (Signed)
 Patient called back stating he still has not received an email and is wanting to know what is going on. I advised patient to check his Spam and he states the email is not there.

## 2023-09-19 NOTE — Telephone Encounter (Signed)
 Can you please have him try to access the info this way through a website. It may be easier to navigate on a computer screen. Just trying to think of another way for him to get his information. Thank you!!!!  Create your account at [email or web address] by using your personalized search codes below: Code 1: [id number] Code 2: [email or web address]

## 2023-11-06 ENCOUNTER — Telehealth: Payer: Self-pay | Admitting: Family Medicine

## 2023-11-06 NOTE — Telephone Encounter (Signed)
 Copied from CRM [id number]. Topic: Medicare AWV >> Nov 06, 2023  9:53 AM Nathanel DEL wrote: Reason for CRM: LVM 11/06/2023 to schedule AWV. Please schedule Virtual or Telehealth visits ONLY.   Nathanel Paschal; Care Guide Ambulatory Clinical Support [place] [facility] Direct Dial: [phone]

## 2023-11-28 ENCOUNTER — Encounter: Payer: Self-pay | Admitting: Family Medicine

## 2023-11-28 ENCOUNTER — Ambulatory Visit (INDEPENDENT_AMBULATORY_CARE_PROVIDER_SITE_OTHER): Admitting: Family Medicine

## 2023-11-28 VITALS — BP 118/64 | HR 62 | Temp 97.5°F | Resp 16 | Ht 63.0 in | Wt 152.0 lb

## 2023-11-28 DIAGNOSIS — L989 Disorder of the skin and subcutaneous tissue, unspecified: Secondary | ICD-10-CM | POA: Diagnosis not present

## 2023-11-28 MED ORDER — CEPHALEXIN 500 MG PO CAPS: 500.0000 mg | ORAL_CAPSULE | Freq: Three times a day (TID) | ORAL | 0 refills | Status: DC

## 2023-11-28 NOTE — Patient Instructions (Addendum)
 Do not shower for the rest of the day. When you do wash it, use only soap and water. Do not vigorously scrub. Keep the area clean and dry.   Things to look out for: increasing pain not relieved by ibuprofen/acetaminophen , fevers, spreading redness, drainage of pus, or foul odor.  Send me a message in 3-4 days if not obviously improving.   Let us  know if you need anything.

## 2023-11-28 NOTE — Progress Notes (Signed)
 Chief Complaint  Patient presents with   Knee Injury    Knee Wound    Ronnie Payne is a 83 y.o. male here for a skin complaint.  Duration: 3 weeks Location: R knee Pruritic? Yes Painful? Yes Drainage? Yes- yellowish New soaps/lotions/topicals/detergents? No Trauma? Yes Other associated symptoms: not improving, no fevers Therapies tried thus far: TAO  Past Medical History:  Diagnosis Date   Arthritis    Cold sore    Frequent headaches    History of skin cancer    Hyperlipidemia    Hypertension    Prediabetes     BP 118/64 (BP Location: Left Arm, Patient Position: Sitting)   Pulse 62   Temp (!) 97.5 F (36.4 C) (Oral)   Resp 16   Ht 5' 3 (1.6 m)   Wt 152 lb (68.9 kg)   SpO2 96%   BMI 26.93 kg/m  Gen: awake, alert, appearing stated age Lungs: No accessory muscle use Skin: pinkish/reddish skin over ant R knee that does blanch. Mild ttp, slightly thick/yellow drainage noted. No excoriation or obvious fluctuance.  Psych: Age appropriate judgment and insight  Skin lesion  7 d of Keflex  500 mg TID given drainage. If no improvement in a few d, will consider steroid cream.  F/u prn. The patient voiced understanding and agreement to the plan.  Mabel Mt [place], DO 11/28/23 2:00 PM

## 2023-11-29 ENCOUNTER — Ambulatory Visit: Admitting: Family Medicine

## 2023-12-01 ENCOUNTER — Encounter: Payer: Self-pay | Admitting: Family Medicine

## 2023-12-02 ENCOUNTER — Other Ambulatory Visit: Payer: Self-pay | Admitting: Family Medicine

## 2023-12-02 MED ORDER — CLOTRIMAZOLE-BETAMETHASONE 1-0.05 % EX CREA: 1.0000 | TOPICAL_CREAM | Freq: Every day | CUTANEOUS | 0 refills | Status: DC

## 2023-12-24 DIAGNOSIS — H0102B Squamous blepharitis left eye, upper and lower eyelids: Secondary | ICD-10-CM | POA: Diagnosis not present

## 2023-12-24 DIAGNOSIS — H26493 Other secondary cataract, bilateral: Secondary | ICD-10-CM | POA: Diagnosis not present

## 2023-12-24 DIAGNOSIS — H31091 Other chorioretinal scars, right eye: Secondary | ICD-10-CM | POA: Diagnosis not present

## 2023-12-24 DIAGNOSIS — Z961 Presence of intraocular lens: Secondary | ICD-10-CM | POA: Diagnosis not present

## 2023-12-24 DIAGNOSIS — H0102A Squamous blepharitis right eye, upper and lower eyelids: Secondary | ICD-10-CM | POA: Diagnosis not present

## 2023-12-25 ENCOUNTER — Ambulatory Visit

## 2023-12-26 DIAGNOSIS — J343 Hypertrophy of nasal turbinates: Secondary | ICD-10-CM | POA: Diagnosis not present

## 2023-12-26 DIAGNOSIS — J342 Deviated nasal septum: Secondary | ICD-10-CM | POA: Diagnosis not present

## 2023-12-30 DIAGNOSIS — D2272 Melanocytic nevi of left lower limb, including hip: Secondary | ICD-10-CM | POA: Diagnosis not present

## 2023-12-30 DIAGNOSIS — L814 Other melanin hyperpigmentation: Secondary | ICD-10-CM | POA: Diagnosis not present

## 2023-12-30 DIAGNOSIS — D225 Melanocytic nevi of trunk: Secondary | ICD-10-CM | POA: Diagnosis not present

## 2023-12-30 DIAGNOSIS — L570 Actinic keratosis: Secondary | ICD-10-CM | POA: Diagnosis not present

## 2023-12-30 DIAGNOSIS — L821 Other seborrheic keratosis: Secondary | ICD-10-CM | POA: Diagnosis not present

## 2023-12-30 DIAGNOSIS — D1801 Hemangioma of skin and subcutaneous tissue: Secondary | ICD-10-CM | POA: Diagnosis not present

## 2023-12-30 DIAGNOSIS — D2271 Melanocytic nevi of right lower limb, including hip: Secondary | ICD-10-CM | POA: Diagnosis not present

## 2023-12-30 DIAGNOSIS — Z85820 Personal history of malignant melanoma of skin: Secondary | ICD-10-CM | POA: Diagnosis not present

## 2023-12-30 DIAGNOSIS — Z85828 Personal history of other malignant neoplasm of skin: Secondary | ICD-10-CM | POA: Diagnosis not present

## 2023-12-30 DIAGNOSIS — B353 Tinea pedis: Secondary | ICD-10-CM | POA: Diagnosis not present

## 2024-01-07 ENCOUNTER — Encounter: Payer: Self-pay | Admitting: Family Medicine

## 2024-01-07 ENCOUNTER — Ambulatory Visit (INDEPENDENT_AMBULATORY_CARE_PROVIDER_SITE_OTHER): Admitting: Family Medicine

## 2024-01-07 VITALS — BP 110/68 | HR 68 | Temp 98.0°F | Resp 16 | Ht 63.0 in | Wt 151.0 lb

## 2024-01-07 DIAGNOSIS — J069 Acute upper respiratory infection, unspecified: Secondary | ICD-10-CM | POA: Diagnosis not present

## 2024-01-07 MED ORDER — BENZONATATE 200 MG PO CAPS: 200.0000 mg | ORAL_CAPSULE | Freq: Two times a day (BID) | ORAL | 0 refills | Status: DC | PRN

## 2024-01-07 NOTE — Progress Notes (Signed)
 Chief Complaint  Patient presents with   Fever    Fever     Ronnie Payne here for URI complaints.  Duration: 3 days  Associated symptoms: Fever (101 F max), sinus congestion, rhinorrhea, sore throat, myalgia, and coughing Denies: sinus pain, itchy watery eyes, ear pain, ear drainage, wheezing, shortness of breath, and N/V/D Treatment to date: Tylenol , saline rinses Sick contacts: No  Past Medical History:  Diagnosis Date   Arthritis    Cold sore    Frequent headaches    History of skin cancer    Hyperlipidemia    Hypertension    Prediabetes     Objective BP 110/68 (BP Location: Left Arm, Patient Position: Sitting)   Pulse 68   Temp 98 F (36.7 C) (Oral)   Resp 16   Ht 5' 3 (1.6 m)   Wt 151 lb (68.5 kg)   SpO2 96%   BMI 26.75 kg/m  General: Awake, alert, appears stated age HEENT: AT, [place], ears patent b/l and TM's neg, nares patent w/o discharge, pharynx pink and without exudates, MMM, no sinus ttp Neck: No masses or asymmetry Heart: RRR Lungs: CTAB, no accessory muscle use Psych: Age appropriate judgment and insight, normal mood and affect  Viral URI with cough - Plan: benzonatate  (TESSALON ) 200 MG capsule  Starting to feel better. Tessalon  [place] prn. Continue to push fluids, practice good hand hygiene, cover mouth when coughing. F/u in 1 mo for med ck. If starting to experience fevers, shaking, or shortness of breath, seek immediate care. Pt voiced understanding and agreement to the plan.  Mabel Mt [place], DO 01/07/24 2:27 PM

## 2024-01-07 NOTE — Patient Instructions (Signed)
 Continue to push fluids, practice good hand hygiene, and cover your mouth if you cough. ? ?If you start having fevers, shaking or shortness of breath, seek immediate care. ? ?OK to take Tylenol 1000 mg (2 extra strength tabs) or 975 mg (3 regular strength tabs) every 6 hours as needed. ? ?Let us know if you need anything. ?

## 2024-01-28 DIAGNOSIS — N5201 Erectile dysfunction due to arterial insufficiency: Secondary | ICD-10-CM | POA: Diagnosis not present

## 2024-01-28 DIAGNOSIS — R3914 Feeling of incomplete bladder emptying: Secondary | ICD-10-CM | POA: Diagnosis not present

## 2024-01-28 DIAGNOSIS — R3982 Chronic bladder pain: Secondary | ICD-10-CM | POA: Diagnosis not present

## 2024-01-28 DIAGNOSIS — N401 Enlarged prostate with lower urinary tract symptoms: Secondary | ICD-10-CM | POA: Diagnosis not present

## 2024-02-10 ENCOUNTER — Other Ambulatory Visit: Payer: Self-pay

## 2024-02-10 MED ORDER — LOSARTAN POTASSIUM 25 MG PO TABS: 25.0000 mg | ORAL_TABLET | Freq: Every day | ORAL | 2 refills | Status: AC

## 2024-02-11 DIAGNOSIS — R3914 Feeling of incomplete bladder emptying: Secondary | ICD-10-CM | POA: Diagnosis not present

## 2024-02-11 DIAGNOSIS — R3912 Poor urinary stream: Secondary | ICD-10-CM | POA: Diagnosis not present

## 2024-02-11 DIAGNOSIS — N401 Enlarged prostate with lower urinary tract symptoms: Secondary | ICD-10-CM | POA: Diagnosis not present

## 2024-03-03 ENCOUNTER — Ambulatory Visit (INDEPENDENT_AMBULATORY_CARE_PROVIDER_SITE_OTHER): Admitting: *Deleted

## 2024-03-03 VITALS — BP 132/70 | HR 66 | Temp 98.2°F | Resp 18 | Ht 63.0 in | Wt 155.4 lb

## 2024-03-03 DIAGNOSIS — Z0000 Encounter for general adult medical examination without abnormal findings: Secondary | ICD-10-CM

## 2024-03-03 NOTE — Progress Notes (Signed)
 Subjective:   Ronnie Payne is a 83 y.o. who presents for a Medicare Wellness preventive visit.  As a reminder, Annual Wellness Visits don't include a physical exam, and some assessments may be limited, especially if this visit is performed virtually. We may recommend an in-person follow-up visit with your provider if needed.  Visit Complete: In person  Persons Participating in Visit: Patient.  AWV Questionnaire: Yes: Patient Medicare AWV questionnaire was completed by the patient on 03/03/25; I have confirmed that all information answered by patient is correct and no changes since this date.  Cardiac Risk Factors include: advanced age (>81men, >17 women);male gender;hypertension;dyslipidemia     Objective:    Today's Vitals   03/03/24 1341  BP: 132/70  Pulse: 66  Resp: 18  Temp: 98.2 F (36.8 C)  TempSrc: Oral  SpO2: 96%  Weight: 155 lb 6.4 oz (70.5 kg)  Height: 5' 3 (1.6 m)   Body mass index is 27.53 kg/m.     03/03/2024    1:51 PM 12/20/2021    2:14 PM 07/12/2021   10:11 AM 06/29/2021    9:00 AM 06/28/2021   11:59 AM 10/19/2020    3:42 PM 10/16/2019    3:21 PM  Advanced Directives  Does Patient Have a Medical Advance Directive? Yes Yes No No Yes Yes Yes  Type of Advance Directive Living will Healthcare Power of [place];Living will    Healthcare Power of [place];Living will Healthcare Power of [place];Living will  Does patient want to make changes to medical advance directive? No - Patient declined No - Patient declined No - Patient declined No - Patient declined No - Patient declined  No - Patient declined  Copy of Healthcare Power of Attorney in Chart?  Yes - validated most recent copy scanned in chart (See row information)    No - copy requested No - copy requested  Would patient like information on creating a medical advance directive?   No - Patient declined No - Patient declined       Current Medications (verified) Outpatient Encounter Medications as of  03/03/2024  Medication Sig   atenolol  (TENORMIN ) 50 MG tablet TAKE 1 TABLET(50 MG) BY MOUTH DAILY   atorvastatin  (LIPITOR) 20 MG tablet Take 1 tablet (20 mg total) by mouth daily.   losartan  (COZAAR ) 25 MG tablet Take 1 tablet (25 mg total) by mouth daily.   [DISCONTINUED] benzonatate  (TESSALON ) 200 MG capsule Take 1 capsule (200 mg total) by mouth 2 (two) times daily as needed for cough. (Patient not taking: Reported on 03/03/2024)   [DISCONTINUED] clotrimazole -betamethasone  (LOTRISONE ) cream Apply 1 Application topically daily.   No facility-administered encounter medications on file as of 03/03/2024.    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   Procedure: TOTAL HIP ARTHROPLASTY ANTERIOR APPROACH;  Surgeon: Liam Lerner, MD;  Location: [facility] OR;  Service: Orthopedics;  Laterality: Left;   TRANSURETHRAL RESECTION OF PROSTATE  2000-2012   Family History  Problem Relation Age of Onset   Thyroid cancer Mother    Emphysema Father    Lung  cancer Sister    Allergic rhinitis Daughter    Colon cancer Neg Hx    Colon polyps Neg Hx    Esophageal cancer Neg Hx    Stomach cancer Neg Hx    Rectal cancer Neg Hx    Social History   Socioeconomic History   Marital status: Married    Spouse name: Not on file   Number of children: 1   Years of education: Not on file   Highest education level: Master's degree (e.g., MA, MS, MEng, MEd, MSW, MBA)  Occupational History   Occupation: retired    Comment: airline pilot  Tobacco Use   Smoking status: Never   Smokeless tobacco: Never  Vaping Use   Vaping status: Never Used   Substance and Sexual Activity   Alcohol  use: No   Drug use: No   Sexual activity: Yes  Other Topics Concern   Not on file  Social History Narrative   Not on file   Social Drivers of Health   Financial Resource Strain: Low Risk  (03/03/2024)   Overall Financial Resource Strain (CARDIA)    Difficulty of Paying Living Expenses: Not hard at all  Food Insecurity: No Food Insecurity (03/03/2024)   Hunger Vital Sign    Worried About Running Out of Food in the Last Year: Never true    Ran Out of Food in the Last Year: Never true  Transportation Needs: No Transportation Needs (03/03/2024)   PRAPARE - Administrator, Civil Service (Medical): No    Lack of Transportation (Non-Medical): No  Physical Activity: Sufficiently Active (03/03/2024)   Exercise Vital Sign    Days of Exercise per Week: 2 days    Minutes of Exercise per Session: 90 min  Stress: No Stress Concern Present (03/03/2024)   [employer] of Occupational Health - Occupational Stress Questionnaire    Feeling of Stress: Not at all  Social Connections: Moderately Isolated (03/03/2024)   Social Connection and Isolation Panel    Frequency of Communication with Friends and Family: Once a week    Frequency of Social Gatherings with Friends and Family: Once a week    Attends Religious Services: More than 4 times per year    Active Member of [employer] or Organizations: No    Attends Engineer, Structural: Not on file    Marital Status: Married    Tobacco Counseling Counseling given: Not Answered    Clinical Intake:  Pre-visit preparation completed: Yes  Pain : No/denies pain     BMI - recorded: 27.53 Nutritional Status: BMI 25 -29 Overweight Nutritional Risks: None Diabetes: No  Lab Results  Component Value Date   HGBA1C 5.5 05/03/2023   HGBA1C 5.5 08/21/2022   HGBA1C 5.4 06/21/2021     How often do you need to have someone help you when you read instructions, pamphlets, or other written  materials from your doctor or pharmacy?: 1 - Never What is the last grade level you completed in school?: master's degree  Interpreter Needed?: No  Information entered by :: Lolita Libra, CMA(AAMA)   Activities of Daily Living     03/03/2024   10:24 AM  In your present state of health, do you have any difficulty performing the following activities:  Hearing? 0  Vision? 0  Difficulty concentrating or making decisions? 0  Walking or climbing stairs? 0  Dressing or bathing? 0  Doing errands, shopping? 0  Preparing Food and eating ? N  Using the Toilet? N  In  the past six months, have you accidently leaked urine? N  Do you have problems with loss of bowel control? N  Managing your Medications? N  Managing your Finances? N  Housekeeping or managing your Housekeeping? N    Patient Care Team: Frann Mabel Mt, DO as PCP - General (Family Medicine) Glendia Simmonds, OD as Referring Physician (Optometry) [facility]  I have updated your Care Teams any recent Medical Services you may have received from other providers in the past year.     Assessment:   This is a routine wellness examination for [facility].  Hearing/Vision screen Hearing Screening - Comments:: Denies hearing difficulties.  Vision Screening - Comments:: Up to date with routine eye exams with [facility]   Goals Addressed             This Visit's Progress    Patient Stated   On track    Maintain current health       Depression Screen     03/03/2024    1:48 PM 01/07/2024    2:13 PM 11/28/2023    1:51 PM 08/21/2022    9:13 AM 12/20/2021    2:15 PM 12/21/2020   10:06 AM 10/19/2020    3:47 PM  PHQ 2/9 Scores  PHQ - 2 Score 0 0 0 0 0 0 0  PHQ- 9 Score 0 0  1       Fall Risk     03/03/2024   10:24 AM 01/07/2024    2:13 PM 08/21/2022    9:13 AM 12/20/2021    2:15 PM 12/21/2020   10:06 AM  Fall Risk   Falls in the past year? 0 0 0 0 0  Number falls in past yr: 0 0 0 0 0  Injury  with Fall? 0 0 0 0 0  Risk for fall due to : No Fall Risks  No Fall Risks No Fall Risks   Follow up Education provided Falls evaluation completed Falls evaluation completed Falls evaluation completed  Falls evaluation completed      Data saved with a previous flowsheet row definition    MEDICARE RISK AT HOME:  Medicare Risk at Home Any stairs in or around the home?: (Patient-Rptd) Yes If so, are there any without handrails?: (Patient-Rptd) No Home free of loose throw rugs in walkways, pet beds, electrical cords, etc?: (Patient-Rptd) Yes Adequate lighting in your home to reduce risk of falls?: (Patient-Rptd) Yes Life alert?: (Patient-Rptd) No Use of a cane, walker or w/c?: (Patient-Rptd) No Grab bars in the bathroom?: (Patient-Rptd) No Shower chair or bench in shower?: (Patient-Rptd) Yes Elevated toilet seat or a handicapped toilet?: (Patient-Rptd) No  TIMED UP AND GO:  Was the test performed?  Yes  Length of time to ambulate 10 feet: 7 sec Gait steady and fast without use of assistive device  Cognitive Function: 6CIT completed    07/11/2017    8:56 AM  MMSE - Mini Mental State Exam  Orientation to time 5  Orientation to Place 5  Registration 3  Attention/ Calculation 5  Recall 2  Language- name 2 objects 2  Language- repeat 1  Language- follow 3 step command 3  Language- read & follow direction 1  Write a sentence 1  Copy design 1  Total score 29        03/03/2024    1:51 PM 12/20/2021    2:20 PM  6CIT Screen  What Year? 0 points 0 points  What month? 0  points 0 points  What time? 0 points 0 points  Count back from 20 0 points 0 points  Months in reverse 0 points 0 points  Repeat phrase 2 points 0 points  Total Score 2 points 0 points    Immunizations Immunization History  Administered Date(s) Administered   INFLUENZA, HIGH DOSE SEASONAL PF 01/06/2016, 02/19/2017, 02/03/2018   PFIZER(Purple Top)SARS-COV-2 Vaccination 07/21/2019, 08/11/2019   Pneumococcal  Conjugate-13 02/05/2015   Pneumococcal Polysaccharide-23 02/19/2017   Pneumococcal-Unspecified 02/05/2015   Tdap 09/22/2018    Screening Tests Health Maintenance  Topic Date Due   Influenza Vaccine  08/04/2024 (Originally 12/06/2023)   Medicare Annual Wellness (AWV)  03/03/2025   DTaP/Tdap/Td (2 - Td or Tdap) 09/21/2028   Pneumococcal Vaccine: 50+ Years  Completed   Meningococcal B Vaccine  Aged Out   COVID-19 Vaccine  Discontinued   Zoster Vaccines- Shingrix  Discontinued    Health Maintenance Items Addressed: All HM up to date  Additional Screening:  Vision Screening: Recommended annual ophthalmology exams for early detection of glaucoma and other disorders of the eye. Is the patient up to date with their annual eye exam?  Yes  Who is the provider or what is the name of the office in which the patient attends annual eye exams? [facility]  Dental Screening: Recommended annual dental exams for proper oral hygiene  Community Resource Referral / Chronic Care Management: CRR required this visit?  No   CCM required this visit?  No   Plan:    I have personally reviewed and noted the following in the patient's chart:   Medical and social history Use of alcohol , tobacco or illicit drugs  Current medications and supplements including opioid prescriptions. Patient is not currently taking opioid prescriptions. Functional ability and status Nutritional status Physical activity Advanced directives List of other physicians Hospitalizations, surgeries, and ER visits in previous 12 months Vitals Screenings to include cognitive, depression, and falls Referrals and appointments  In addition, I have reviewed and discussed with patient certain preventive protocols, quality metrics, and best practice recommendations. A written personalized care plan for preventive services as well as general preventive health recommendations were provided to patient.   Lolita Libra,  CMA   03/03/2024   After Visit Summary: (In Person-Printed) AVS printed and given to the patient  Notes: Nothing significant to report at this time.

## 2024-03-03 NOTE — Patient Instructions (Addendum)
 [name] , Thank you for taking time out of your busy schedule to complete your Annual Wellness Visit with me. I enjoyed our conversation and look forward to speaking with you again next year. I, as well as your care team,  appreciate your ongoing commitment to your health goals. Please review the following plan we discussed and let me know if I can assist you in the future. Your Game plan/ To Do List     Follow up Visits: Next Medicare AWV with our clinical staff:  03/04/25  1:40pm, in person  Next Office Visit with your provider: 03/10/24 8:30am, fasting Dr Frann  Clinician Recommendations:  Aim for 30 minutes of exercise or brisk walking, 6-8 glasses of water, and 5 servings of fruits and vegetables each day.       This is a list of the screening recommended for you and due dates:  Health Maintenance  Topic Date Due   Medicare Annual Wellness Visit  12/21/2022   Flu Shot  08/04/2024*   DTaP/Tdap/Td vaccine (2 - Td or Tdap) 09/21/2028   Pneumococcal Vaccine for age over 54  Completed   Meningitis B Vaccine  Aged Out   COVID-19 Vaccine  Discontinued   Zoster (Shingles) Vaccine  Discontinued  *Topic was postponed. The date shown is not the original due date.    Advanced directives: (In Chart) A copy of your advanced directives are scanned into your chart should your provider ever need it. Advance Care Planning is important because it:  [x]  Makes sure you receive the medical care that is consistent with your values, goals, and preferences  [x]  It provides guidance to your family and loved ones and reduces their decisional burden about whether or not they are making the right decisions based on your wishes.  Follow the link provided in your after visit summary or read over the paperwork we have mailed to you to help you started getting your Advance Directives in place. If you need assistance in completing these, please reach out to us  so that we can help you!  See attachments  for Preventive Care and Fall Prevention Tips.

## 2024-03-10 ENCOUNTER — Encounter: Payer: Self-pay | Admitting: Family Medicine

## 2024-03-10 ENCOUNTER — Ambulatory Visit: Payer: Self-pay | Admitting: Family Medicine

## 2024-03-10 ENCOUNTER — Ambulatory Visit (INDEPENDENT_AMBULATORY_CARE_PROVIDER_SITE_OTHER): Admitting: Family Medicine

## 2024-03-10 VITALS — BP 118/68 | HR 60 | Temp 98.0°F | Resp 16 | Ht 63.0 in | Wt 149.8 lb

## 2024-03-10 DIAGNOSIS — I10 Essential (primary) hypertension: Secondary | ICD-10-CM | POA: Diagnosis not present

## 2024-03-10 DIAGNOSIS — E785 Hyperlipidemia, unspecified: Secondary | ICD-10-CM | POA: Diagnosis not present

## 2024-03-10 LAB — COMPREHENSIVE METABOLIC PANEL WITH GFR
ALT: 31 U/L (ref 0–53)
AST: 27 U/L (ref 0–37)
Albumin: 4.5 g/dL (ref 3.5–5.2)
Alkaline Phosphatase: 68 U/L (ref 39–117)
BUN: 19 mg/dL (ref 6–23)
CO2: 29 meq/L (ref 19–32)
Calcium: 9.3 mg/dL (ref 8.4–10.5)
Chloride: 101 meq/L (ref 96–112)
Creatinine, Ser: 0.96 mg/dL (ref 0.40–1.50)
GFR: 73.06 mL/min (ref >60.00)
Glucose, Bld: 86 mg/dL (ref 70–99)
Potassium: 4.3 meq/L (ref 3.5–5.1)
Sodium: 138 meq/L (ref 135–145)
Total Bilirubin: 1.1 mg/dL (ref 0.2–1.2)
Total Protein: 6.6 g/dL (ref 6.0–8.3)

## 2024-03-10 LAB — LIPID PANEL
Cholesterol: 171 mg/dL (ref 0–200)
HDL: 50.8 mg/dL (ref >39.00)
LDL Cholesterol: 103 mg/dL — ABNORMAL HIGH (ref 0–99)
NonHDL: 119.91
Total CHOL/HDL Ratio: 3
Triglycerides: 84 mg/dL (ref 0.0–149.0)
VLDL: 16.8 mg/dL (ref 0.0–40.0)

## 2024-03-10 NOTE — Progress Notes (Signed)
 Chief Complaint  Patient presents with   Follow-up    Follow Up    Subjective Ronnie Payne is a 83 y.o. male who presents for hypertension follow up. He does monitor home blood pressures. Blood pressures ranging from 120-130's/60's on average. He is compliant with medications- losartan  25 mg/d, atenolol  50 mg/d. Patient has these side effects of medication: none He is adhering to a healthy diet overall. Current exercise: lifting wts No CP or SOB.    Hyperlipidemia Patient presents for hyperlipidemia follow up. Currently being treated with Lipitor 20 mg/d and compliance with treatment thus far has been good. He denies myalgias. Diet/exercise as above.l The patient is not known to have coexisting coronary artery disease.  Past Medical History:  Diagnosis Date   Arthritis    Cold sore    Frequent headaches    History of skin cancer    Hyperlipidemia    Hypertension    Prediabetes     Exam BP 118/68 (BP Location: Left Arm, Patient Position: Sitting)   Pulse 60   Temp 98 F (36.7 C) (Oral)   Resp 16   Ht 5' 3 (1.6 m)   Wt 149 lb 12.8 oz (67.9 kg)   SpO2 98%   BMI 26.54 kg/m  General:  well developed, well nourished, in no apparent distress Heart: RRR, no bruits, no LE edema Lungs: clear to auscultation, no accessory muscle use Psych: well oriented with normal range of affect and appropriate judgment/insight  Essential hypertension  Hyperlipidemia, unspecified hyperlipidemia type - Plan: Comprehensive metabolic panel with GFR, Lipid panel  Chronic, stable.  Continue losartan  25 mg daily, atenolol  50 mg daily.  Counseled on diet and exercise. Chronic, stable.  Continue Lipitor 20 mg daily. F/u in 6 mo. The patient voiced understanding and agreement to the plan.  Mabel [place], DO 03/10/24  9:45 AM

## 2024-03-10 NOTE — Patient Instructions (Signed)
 Give us  2-3 business days to get the results of your labs back.   Keep the diet clean and stay active.  Let us  know if you need anything.

## 2024-03-20 DIAGNOSIS — N5203 Combined arterial insufficiency and corporo-venous occlusive erectile dysfunction: Secondary | ICD-10-CM | POA: Diagnosis not present

## 2024-03-20 DIAGNOSIS — R3912 Poor urinary stream: Secondary | ICD-10-CM | POA: Diagnosis not present

## 2024-03-25 DIAGNOSIS — H6123 Impacted cerumen, bilateral: Secondary | ICD-10-CM | POA: Diagnosis not present

## 2024-04-29 ENCOUNTER — Other Ambulatory Visit (INDEPENDENT_AMBULATORY_CARE_PROVIDER_SITE_OTHER): Payer: Self-pay

## 2024-04-29 ENCOUNTER — Ambulatory Visit: Admitting: Family Medicine

## 2024-04-29 ENCOUNTER — Encounter: Payer: Self-pay | Admitting: Family Medicine

## 2024-04-29 VITALS — BP 105/68 | HR 76 | Temp 98.0°F | Resp 16 | Ht 63.0 in | Wt 156.8 lb

## 2024-04-29 DIAGNOSIS — S61419A Laceration without foreign body of unspecified hand, initial encounter: Secondary | ICD-10-CM | POA: Diagnosis not present

## 2024-04-29 DIAGNOSIS — Z23 Encounter for immunization: Secondary | ICD-10-CM | POA: Diagnosis not present

## 2024-04-29 NOTE — Patient Instructions (Addendum)
When you do wash it, use only soap and water. Do not vigorously scrub. Apply triple antibiotic ointment (like Neosporin) twice daily. Keep the area clean and dry.   Things to look out for: increasing pain not relieved by acetaminophen, fevers, spreading redness, drainage of pus, or foul odor.  Let us know if you need anything.

## 2024-04-29 NOTE — Progress Notes (Signed)
 Chief Complaint  Patient presents with   Laceration    Laceration on Right Hand    Ronnie Payne is a 83 y.o. male here for a skin complaint.  Duration: 5 days Location: R hand Pruritic? No Painful? Yes- improved tho Drainage? No New soaps/lotions/topicals/detergents? No Trauma? Yes- metal trash can lid crashed into hand Other associated symptoms: bruising (improved); no fevers, spreading redness Therapies tried thus far: glued at UC Tdap was 09/22/18  Past Medical History:  Diagnosis Date   Arthritis    Cold sore    Frequent headaches    History of skin cancer    Hyperlipidemia    Hypertension    Prediabetes     BP 105/68 (BP Location: Left Arm, Patient Position: Sitting)   Pulse 76   Temp 98 F (36.7 C) (Oral)   Resp 16   Ht 5' 3 (1.6 m)   Wt 156 lb 12.8 oz (71.1 kg)   SpO2 96%   BMI 27.78 kg/m  Gen: awake, alert, appearing stated age Lungs: No accessory muscle use Skin: See below. No drainage, erythema, TTP, fluctuance Psych: Age appropriate judgment and insight    Laceration of dorsum of hand  TAO.  Update tetanus shot today.  Aftercare instructions verbalized and written down. Warning signs and symptoms verbalized and written down in AVS.  F/u prn. The patient voiced understanding and agreement to the plan.  Mabel [place], DO 04/29/2024 8:22 AM

## 2024-05-15 ENCOUNTER — Other Ambulatory Visit: Payer: Self-pay | Admitting: Family Medicine

## 2024-05-15 DIAGNOSIS — E785 Hyperlipidemia, unspecified: Secondary | ICD-10-CM

## 2024-05-25 ENCOUNTER — Other Ambulatory Visit: Payer: Self-pay

## 2024-05-25 DIAGNOSIS — I10 Essential (primary) hypertension: Secondary | ICD-10-CM

## 2024-05-25 MED ORDER — ATENOLOL 50 MG PO TABS: 50.0000 mg | ORAL_TABLET | Freq: Every day | ORAL | 3 refills | Status: AC

## 2025-03-04 ENCOUNTER — Ambulatory Visit
